# Patient Record
Sex: Female | Born: 1960
Health system: Southern US, Community
[De-identification: ages and names within clinical notes are randomized; demographics above are authoritative.]

## PROBLEM LIST (undated history)

## (undated) DIAGNOSIS — E785 Hyperlipidemia, unspecified: Secondary | ICD-10-CM

## (undated) DIAGNOSIS — M810 Age-related osteoporosis without current pathological fracture: Secondary | ICD-10-CM

## (undated) DIAGNOSIS — S069X9A Unspecified intracranial injury with loss of consciousness of unspecified duration, initial encounter: Secondary | ICD-10-CM

## (undated) DIAGNOSIS — S069XAA Unspecified intracranial injury with loss of consciousness status unknown, initial encounter: Secondary | ICD-10-CM

## (undated) DIAGNOSIS — I10 Essential (primary) hypertension: Secondary | ICD-10-CM

## (undated) DIAGNOSIS — E119 Type 2 diabetes mellitus without complications: Secondary | ICD-10-CM

## (undated) DIAGNOSIS — R479 Unspecified speech disturbances: Secondary | ICD-10-CM

## (undated) DIAGNOSIS — R4183 Borderline intellectual functioning: Secondary | ICD-10-CM

## (undated) DIAGNOSIS — H532 Diplopia: Secondary | ICD-10-CM

## (undated) DIAGNOSIS — G8250 Quadriplegia, unspecified: Secondary | ICD-10-CM

## (undated) DIAGNOSIS — F329 Major depressive disorder, single episode, unspecified: Secondary | ICD-10-CM

## (undated) DIAGNOSIS — K5909 Other constipation: Secondary | ICD-10-CM

## (undated) DIAGNOSIS — F32A Depression, unspecified: Secondary | ICD-10-CM

## (undated) DIAGNOSIS — M1990 Unspecified osteoarthritis, unspecified site: Secondary | ICD-10-CM

## (undated) HISTORY — PX: CHOLECYSTECTOMY: SHX55

---

## 2004-12-13 ENCOUNTER — Emergency Department: Payer: Self-pay | Admitting: General Practice

## 2004-12-15 ENCOUNTER — Emergency Department: Payer: Self-pay | Admitting: Internal Medicine

## 2006-04-01 ENCOUNTER — Ambulatory Visit: Payer: Self-pay

## 2006-04-05 ENCOUNTER — Ambulatory Visit: Payer: Self-pay

## 2006-08-17 ENCOUNTER — Ambulatory Visit: Payer: Self-pay | Admitting: Family Medicine

## 2006-11-11 ENCOUNTER — Ambulatory Visit: Payer: Self-pay | Admitting: Family Medicine

## 2007-08-25 ENCOUNTER — Encounter: Admission: RE | Admit: 2007-08-25 | Discharge: 2007-08-25 | Payer: Self-pay | Admitting: Neurology

## 2007-09-01 ENCOUNTER — Ambulatory Visit: Payer: Self-pay | Admitting: Family Medicine

## 2008-08-19 ENCOUNTER — Emergency Department: Payer: Self-pay | Admitting: Emergency Medicine

## 2008-10-17 ENCOUNTER — Ambulatory Visit: Payer: Self-pay | Admitting: Family Medicine

## 2008-11-01 ENCOUNTER — Ambulatory Visit: Payer: Self-pay | Admitting: Family Medicine

## 2009-06-18 ENCOUNTER — Ambulatory Visit: Payer: Self-pay | Admitting: Family Medicine

## 2009-10-27 ENCOUNTER — Emergency Department: Payer: Self-pay | Admitting: Emergency Medicine

## 2010-01-15 ENCOUNTER — Emergency Department: Payer: Self-pay | Admitting: Emergency Medicine

## 2010-08-26 ENCOUNTER — Ambulatory Visit: Payer: Self-pay | Admitting: Family Medicine

## 2010-09-15 ENCOUNTER — Emergency Department: Payer: Self-pay | Admitting: Internal Medicine

## 2010-10-11 ENCOUNTER — Emergency Department: Payer: Self-pay | Admitting: Emergency Medicine

## 2011-01-04 ENCOUNTER — Ambulatory Visit: Payer: Self-pay

## 2011-02-05 DIAGNOSIS — F3173 Bipolar disorder, in partial remission, most recent episode manic: Secondary | ICD-10-CM | POA: Diagnosis not present

## 2011-02-05 DIAGNOSIS — F079 Unspecified personality and behavioral disorder due to known physiological condition: Secondary | ICD-10-CM | POA: Diagnosis not present

## 2011-02-05 DIAGNOSIS — Z993 Dependence on wheelchair: Secondary | ICD-10-CM | POA: Diagnosis not present

## 2011-02-09 DIAGNOSIS — I10 Essential (primary) hypertension: Secondary | ICD-10-CM | POA: Diagnosis not present

## 2011-02-09 DIAGNOSIS — E119 Type 2 diabetes mellitus without complications: Secondary | ICD-10-CM | POA: Diagnosis not present

## 2011-02-09 DIAGNOSIS — E7800 Pure hypercholesterolemia, unspecified: Secondary | ICD-10-CM | POA: Diagnosis not present

## 2011-02-26 DIAGNOSIS — H16329 Diffuse interstitial keratitis, unspecified eye: Secondary | ICD-10-CM | POA: Diagnosis not present

## 2011-03-02 DIAGNOSIS — H16329 Diffuse interstitial keratitis, unspecified eye: Secondary | ICD-10-CM | POA: Diagnosis not present

## 2011-03-06 DIAGNOSIS — B351 Tinea unguium: Secondary | ICD-10-CM | POA: Diagnosis not present

## 2011-03-06 DIAGNOSIS — M79609 Pain in unspecified limb: Secondary | ICD-10-CM | POA: Diagnosis not present

## 2011-03-17 ENCOUNTER — Ambulatory Visit: Payer: Self-pay | Admitting: Internal Medicine

## 2011-03-17 DIAGNOSIS — M79609 Pain in unspecified limb: Secondary | ICD-10-CM | POA: Diagnosis not present

## 2011-03-30 DIAGNOSIS — H16329 Diffuse interstitial keratitis, unspecified eye: Secondary | ICD-10-CM | POA: Diagnosis not present

## 2011-04-14 DIAGNOSIS — E7800 Pure hypercholesterolemia, unspecified: Secondary | ICD-10-CM | POA: Diagnosis not present

## 2011-04-14 DIAGNOSIS — I10 Essential (primary) hypertension: Secondary | ICD-10-CM | POA: Diagnosis not present

## 2011-04-15 DIAGNOSIS — F39 Unspecified mood [affective] disorder: Secondary | ICD-10-CM | POA: Diagnosis not present

## 2011-04-20 DIAGNOSIS — H16329 Diffuse interstitial keratitis, unspecified eye: Secondary | ICD-10-CM | POA: Diagnosis not present

## 2011-05-21 DIAGNOSIS — N3944 Nocturnal enuresis: Secondary | ICD-10-CM | POA: Diagnosis not present

## 2011-06-11 DIAGNOSIS — F39 Unspecified mood [affective] disorder: Secondary | ICD-10-CM | POA: Diagnosis not present

## 2011-06-23 DIAGNOSIS — M79609 Pain in unspecified limb: Secondary | ICD-10-CM | POA: Diagnosis not present

## 2011-06-23 DIAGNOSIS — B351 Tinea unguium: Secondary | ICD-10-CM | POA: Diagnosis not present

## 2011-07-17 DIAGNOSIS — Z23 Encounter for immunization: Secondary | ICD-10-CM | POA: Diagnosis not present

## 2011-07-20 DIAGNOSIS — F70 Mild intellectual disabilities: Secondary | ICD-10-CM | POA: Diagnosis not present

## 2011-07-22 DIAGNOSIS — F70 Mild intellectual disabilities: Secondary | ICD-10-CM | POA: Diagnosis not present

## 2011-07-28 DIAGNOSIS — N390 Urinary tract infection, site not specified: Secondary | ICD-10-CM | POA: Diagnosis not present

## 2011-09-21 DIAGNOSIS — G3189 Other specified degenerative diseases of nervous system: Secondary | ICD-10-CM | POA: Diagnosis not present

## 2011-09-21 DIAGNOSIS — F17200 Nicotine dependence, unspecified, uncomplicated: Secondary | ICD-10-CM | POA: Diagnosis not present

## 2011-09-21 DIAGNOSIS — I739 Peripheral vascular disease, unspecified: Secondary | ICD-10-CM | POA: Diagnosis not present

## 2011-09-23 DIAGNOSIS — B351 Tinea unguium: Secondary | ICD-10-CM | POA: Diagnosis not present

## 2011-09-23 DIAGNOSIS — M79609 Pain in unspecified limb: Secondary | ICD-10-CM | POA: Diagnosis not present

## 2011-10-20 ENCOUNTER — Ambulatory Visit: Payer: Self-pay | Admitting: Family Medicine

## 2011-10-20 DIAGNOSIS — Z1231 Encounter for screening mammogram for malignant neoplasm of breast: Secondary | ICD-10-CM | POA: Diagnosis not present

## 2011-11-23 DIAGNOSIS — F17200 Nicotine dependence, unspecified, uncomplicated: Secondary | ICD-10-CM | POA: Diagnosis not present

## 2011-11-23 DIAGNOSIS — E7800 Pure hypercholesterolemia, unspecified: Secondary | ICD-10-CM | POA: Diagnosis not present

## 2011-11-23 DIAGNOSIS — Z23 Encounter for immunization: Secondary | ICD-10-CM | POA: Diagnosis not present

## 2011-12-01 DIAGNOSIS — M545 Low back pain: Secondary | ICD-10-CM | POA: Diagnosis not present

## 2011-12-01 DIAGNOSIS — K5900 Constipation, unspecified: Secondary | ICD-10-CM | POA: Diagnosis not present

## 2011-12-07 DIAGNOSIS — F39 Unspecified mood [affective] disorder: Secondary | ICD-10-CM | POA: Diagnosis not present

## 2012-01-06 DIAGNOSIS — M79609 Pain in unspecified limb: Secondary | ICD-10-CM | POA: Diagnosis not present

## 2012-01-06 DIAGNOSIS — S91109A Unspecified open wound of unspecified toe(s) without damage to nail, initial encounter: Secondary | ICD-10-CM | POA: Diagnosis not present

## 2012-01-06 DIAGNOSIS — B351 Tinea unguium: Secondary | ICD-10-CM | POA: Diagnosis not present

## 2012-02-10 DIAGNOSIS — Z1159 Encounter for screening for other viral diseases: Secondary | ICD-10-CM | POA: Diagnosis not present

## 2012-02-10 DIAGNOSIS — Z0000 Encounter for general adult medical examination without abnormal findings: Secondary | ICD-10-CM | POA: Diagnosis not present

## 2012-02-22 DIAGNOSIS — F339 Major depressive disorder, recurrent, unspecified: Secondary | ICD-10-CM | POA: Diagnosis not present

## 2012-02-22 DIAGNOSIS — F39 Unspecified mood [affective] disorder: Secondary | ICD-10-CM | POA: Diagnosis not present

## 2012-03-03 DIAGNOSIS — Z1159 Encounter for screening for other viral diseases: Secondary | ICD-10-CM | POA: Diagnosis not present

## 2012-03-08 DIAGNOSIS — E11329 Type 2 diabetes mellitus with mild nonproliferative diabetic retinopathy without macular edema: Secondary | ICD-10-CM | POA: Diagnosis not present

## 2012-04-06 DIAGNOSIS — M79609 Pain in unspecified limb: Secondary | ICD-10-CM | POA: Diagnosis not present

## 2012-04-06 DIAGNOSIS — B351 Tinea unguium: Secondary | ICD-10-CM | POA: Diagnosis not present

## 2012-05-17 DIAGNOSIS — F339 Major depressive disorder, recurrent, unspecified: Secondary | ICD-10-CM | POA: Diagnosis not present

## 2012-05-17 DIAGNOSIS — F39 Unspecified mood [affective] disorder: Secondary | ICD-10-CM | POA: Diagnosis not present

## 2012-06-17 DIAGNOSIS — I10 Essential (primary) hypertension: Secondary | ICD-10-CM | POA: Diagnosis not present

## 2012-06-17 DIAGNOSIS — I739 Peripheral vascular disease, unspecified: Secondary | ICD-10-CM | POA: Diagnosis not present

## 2012-06-17 DIAGNOSIS — E119 Type 2 diabetes mellitus without complications: Secondary | ICD-10-CM | POA: Diagnosis not present

## 2012-06-17 DIAGNOSIS — E7800 Pure hypercholesterolemia, unspecified: Secondary | ICD-10-CM | POA: Diagnosis not present

## 2012-06-17 DIAGNOSIS — R350 Frequency of micturition: Secondary | ICD-10-CM | POA: Diagnosis not present

## 2012-06-21 DIAGNOSIS — F329 Major depressive disorder, single episode, unspecified: Secondary | ICD-10-CM | POA: Diagnosis not present

## 2012-06-22 DIAGNOSIS — I10 Essential (primary) hypertension: Secondary | ICD-10-CM | POA: Diagnosis not present

## 2012-06-22 DIAGNOSIS — R05 Cough: Secondary | ICD-10-CM | POA: Diagnosis not present

## 2012-07-06 DIAGNOSIS — M79609 Pain in unspecified limb: Secondary | ICD-10-CM | POA: Diagnosis not present

## 2012-07-06 DIAGNOSIS — B351 Tinea unguium: Secondary | ICD-10-CM | POA: Diagnosis not present

## 2012-07-07 DIAGNOSIS — I739 Peripheral vascular disease, unspecified: Secondary | ICD-10-CM | POA: Diagnosis not present

## 2012-07-07 DIAGNOSIS — I872 Venous insufficiency (chronic) (peripheral): Secondary | ICD-10-CM | POA: Diagnosis not present

## 2012-07-07 DIAGNOSIS — E119 Type 2 diabetes mellitus without complications: Secondary | ICD-10-CM | POA: Diagnosis not present

## 2012-07-07 DIAGNOSIS — M7989 Other specified soft tissue disorders: Secondary | ICD-10-CM | POA: Diagnosis not present

## 2012-07-14 DIAGNOSIS — F329 Major depressive disorder, single episode, unspecified: Secondary | ICD-10-CM | POA: Diagnosis not present

## 2012-07-20 DIAGNOSIS — I69993 Ataxia following unspecified cerebrovascular disease: Secondary | ICD-10-CM | POA: Diagnosis not present

## 2012-07-27 DIAGNOSIS — I69993 Ataxia following unspecified cerebrovascular disease: Secondary | ICD-10-CM | POA: Diagnosis not present

## 2012-07-27 DIAGNOSIS — I69959 Hemiplegia and hemiparesis following unspecified cerebrovascular disease affecting unspecified side: Secondary | ICD-10-CM | POA: Diagnosis not present

## 2012-07-27 DIAGNOSIS — R269 Unspecified abnormalities of gait and mobility: Secondary | ICD-10-CM | POA: Diagnosis not present

## 2012-07-27 DIAGNOSIS — M6281 Muscle weakness (generalized): Secondary | ICD-10-CM | POA: Diagnosis not present

## 2012-08-01 DIAGNOSIS — I69993 Ataxia following unspecified cerebrovascular disease: Secondary | ICD-10-CM | POA: Diagnosis not present

## 2012-08-01 DIAGNOSIS — M6281 Muscle weakness (generalized): Secondary | ICD-10-CM | POA: Diagnosis not present

## 2012-08-01 DIAGNOSIS — R269 Unspecified abnormalities of gait and mobility: Secondary | ICD-10-CM | POA: Diagnosis not present

## 2012-08-01 DIAGNOSIS — I69959 Hemiplegia and hemiparesis following unspecified cerebrovascular disease affecting unspecified side: Secondary | ICD-10-CM | POA: Diagnosis not present

## 2012-08-03 DIAGNOSIS — M6281 Muscle weakness (generalized): Secondary | ICD-10-CM | POA: Diagnosis not present

## 2012-08-03 DIAGNOSIS — R269 Unspecified abnormalities of gait and mobility: Secondary | ICD-10-CM | POA: Diagnosis not present

## 2012-08-03 DIAGNOSIS — I69959 Hemiplegia and hemiparesis following unspecified cerebrovascular disease affecting unspecified side: Secondary | ICD-10-CM | POA: Diagnosis not present

## 2012-08-03 DIAGNOSIS — I69993 Ataxia following unspecified cerebrovascular disease: Secondary | ICD-10-CM | POA: Diagnosis not present

## 2012-08-04 ENCOUNTER — Ambulatory Visit: Payer: Self-pay | Admitting: Neurology

## 2012-08-04 DIAGNOSIS — E785 Hyperlipidemia, unspecified: Secondary | ICD-10-CM | POA: Diagnosis not present

## 2012-08-04 DIAGNOSIS — F29 Unspecified psychosis not due to a substance or known physiological condition: Secondary | ICD-10-CM | POA: Diagnosis not present

## 2012-08-04 DIAGNOSIS — E669 Obesity, unspecified: Secondary | ICD-10-CM | POA: Diagnosis not present

## 2012-08-04 DIAGNOSIS — M79609 Pain in unspecified limb: Secondary | ICD-10-CM | POA: Diagnosis not present

## 2012-08-04 DIAGNOSIS — IMO0002 Reserved for concepts with insufficient information to code with codable children: Secondary | ICD-10-CM | POA: Diagnosis not present

## 2012-08-05 DIAGNOSIS — R4182 Altered mental status, unspecified: Secondary | ICD-10-CM | POA: Diagnosis not present

## 2012-08-08 DIAGNOSIS — I69959 Hemiplegia and hemiparesis following unspecified cerebrovascular disease affecting unspecified side: Secondary | ICD-10-CM | POA: Diagnosis not present

## 2012-08-08 DIAGNOSIS — R269 Unspecified abnormalities of gait and mobility: Secondary | ICD-10-CM | POA: Diagnosis not present

## 2012-08-08 DIAGNOSIS — I69993 Ataxia following unspecified cerebrovascular disease: Secondary | ICD-10-CM | POA: Diagnosis not present

## 2012-08-08 DIAGNOSIS — M6281 Muscle weakness (generalized): Secondary | ICD-10-CM | POA: Diagnosis not present

## 2012-08-11 DIAGNOSIS — F329 Major depressive disorder, single episode, unspecified: Secondary | ICD-10-CM | POA: Diagnosis not present

## 2012-08-15 DIAGNOSIS — R269 Unspecified abnormalities of gait and mobility: Secondary | ICD-10-CM | POA: Diagnosis not present

## 2012-08-15 DIAGNOSIS — I69993 Ataxia following unspecified cerebrovascular disease: Secondary | ICD-10-CM | POA: Diagnosis not present

## 2012-08-15 DIAGNOSIS — I69959 Hemiplegia and hemiparesis following unspecified cerebrovascular disease affecting unspecified side: Secondary | ICD-10-CM | POA: Diagnosis not present

## 2012-08-15 DIAGNOSIS — M6281 Muscle weakness (generalized): Secondary | ICD-10-CM | POA: Diagnosis not present

## 2012-08-17 DIAGNOSIS — I69993 Ataxia following unspecified cerebrovascular disease: Secondary | ICD-10-CM | POA: Diagnosis not present

## 2012-08-17 DIAGNOSIS — I69959 Hemiplegia and hemiparesis following unspecified cerebrovascular disease affecting unspecified side: Secondary | ICD-10-CM | POA: Diagnosis not present

## 2012-08-17 DIAGNOSIS — M6281 Muscle weakness (generalized): Secondary | ICD-10-CM | POA: Diagnosis not present

## 2012-08-17 DIAGNOSIS — R269 Unspecified abnormalities of gait and mobility: Secondary | ICD-10-CM | POA: Diagnosis not present

## 2012-08-23 DIAGNOSIS — F329 Major depressive disorder, single episode, unspecified: Secondary | ICD-10-CM | POA: Diagnosis not present

## 2012-08-24 DIAGNOSIS — M6281 Muscle weakness (generalized): Secondary | ICD-10-CM | POA: Diagnosis not present

## 2012-08-24 DIAGNOSIS — I69993 Ataxia following unspecified cerebrovascular disease: Secondary | ICD-10-CM | POA: Diagnosis not present

## 2012-08-24 DIAGNOSIS — R269 Unspecified abnormalities of gait and mobility: Secondary | ICD-10-CM | POA: Diagnosis not present

## 2012-08-24 DIAGNOSIS — I69959 Hemiplegia and hemiparesis following unspecified cerebrovascular disease affecting unspecified side: Secondary | ICD-10-CM | POA: Diagnosis not present

## 2012-08-26 DIAGNOSIS — I69959 Hemiplegia and hemiparesis following unspecified cerebrovascular disease affecting unspecified side: Secondary | ICD-10-CM | POA: Diagnosis not present

## 2012-08-26 DIAGNOSIS — I69993 Ataxia following unspecified cerebrovascular disease: Secondary | ICD-10-CM | POA: Diagnosis not present

## 2012-08-26 DIAGNOSIS — M6281 Muscle weakness (generalized): Secondary | ICD-10-CM | POA: Diagnosis not present

## 2012-08-26 DIAGNOSIS — R269 Unspecified abnormalities of gait and mobility: Secondary | ICD-10-CM | POA: Diagnosis not present

## 2012-08-29 DIAGNOSIS — I69959 Hemiplegia and hemiparesis following unspecified cerebrovascular disease affecting unspecified side: Secondary | ICD-10-CM | POA: Diagnosis not present

## 2012-08-29 DIAGNOSIS — M6281 Muscle weakness (generalized): Secondary | ICD-10-CM | POA: Diagnosis not present

## 2012-08-29 DIAGNOSIS — I69993 Ataxia following unspecified cerebrovascular disease: Secondary | ICD-10-CM | POA: Diagnosis not present

## 2012-08-29 DIAGNOSIS — R269 Unspecified abnormalities of gait and mobility: Secondary | ICD-10-CM | POA: Diagnosis not present

## 2012-08-30 DIAGNOSIS — F329 Major depressive disorder, single episode, unspecified: Secondary | ICD-10-CM | POA: Diagnosis not present

## 2012-08-31 DIAGNOSIS — I69993 Ataxia following unspecified cerebrovascular disease: Secondary | ICD-10-CM | POA: Diagnosis not present

## 2012-08-31 DIAGNOSIS — R269 Unspecified abnormalities of gait and mobility: Secondary | ICD-10-CM | POA: Diagnosis not present

## 2012-08-31 DIAGNOSIS — I69959 Hemiplegia and hemiparesis following unspecified cerebrovascular disease affecting unspecified side: Secondary | ICD-10-CM | POA: Diagnosis not present

## 2012-08-31 DIAGNOSIS — M6281 Muscle weakness (generalized): Secondary | ICD-10-CM | POA: Diagnosis not present

## 2012-09-02 DIAGNOSIS — R9409 Abnormal results of other function studies of central nervous system: Secondary | ICD-10-CM | POA: Diagnosis not present

## 2012-09-02 DIAGNOSIS — R05 Cough: Secondary | ICD-10-CM | POA: Diagnosis not present

## 2012-09-02 DIAGNOSIS — R059 Cough, unspecified: Secondary | ICD-10-CM | POA: Diagnosis not present

## 2012-09-05 DIAGNOSIS — I69993 Ataxia following unspecified cerebrovascular disease: Secondary | ICD-10-CM | POA: Diagnosis not present

## 2012-09-05 DIAGNOSIS — M6281 Muscle weakness (generalized): Secondary | ICD-10-CM | POA: Diagnosis not present

## 2012-09-05 DIAGNOSIS — R269 Unspecified abnormalities of gait and mobility: Secondary | ICD-10-CM | POA: Diagnosis not present

## 2012-09-05 DIAGNOSIS — I69959 Hemiplegia and hemiparesis following unspecified cerebrovascular disease affecting unspecified side: Secondary | ICD-10-CM | POA: Diagnosis not present

## 2012-09-07 DIAGNOSIS — R269 Unspecified abnormalities of gait and mobility: Secondary | ICD-10-CM | POA: Diagnosis not present

## 2012-09-07 DIAGNOSIS — I69959 Hemiplegia and hemiparesis following unspecified cerebrovascular disease affecting unspecified side: Secondary | ICD-10-CM | POA: Diagnosis not present

## 2012-09-07 DIAGNOSIS — I69993 Ataxia following unspecified cerebrovascular disease: Secondary | ICD-10-CM | POA: Diagnosis not present

## 2012-09-07 DIAGNOSIS — M6281 Muscle weakness (generalized): Secondary | ICD-10-CM | POA: Diagnosis not present

## 2012-09-13 DIAGNOSIS — R269 Unspecified abnormalities of gait and mobility: Secondary | ICD-10-CM | POA: Diagnosis not present

## 2012-09-13 DIAGNOSIS — M6281 Muscle weakness (generalized): Secondary | ICD-10-CM | POA: Diagnosis not present

## 2012-09-13 DIAGNOSIS — I69993 Ataxia following unspecified cerebrovascular disease: Secondary | ICD-10-CM | POA: Diagnosis not present

## 2012-09-13 DIAGNOSIS — I69959 Hemiplegia and hemiparesis following unspecified cerebrovascular disease affecting unspecified side: Secondary | ICD-10-CM | POA: Diagnosis not present

## 2012-09-15 DIAGNOSIS — R269 Unspecified abnormalities of gait and mobility: Secondary | ICD-10-CM | POA: Diagnosis not present

## 2012-09-15 DIAGNOSIS — M6281 Muscle weakness (generalized): Secondary | ICD-10-CM | POA: Diagnosis not present

## 2012-09-15 DIAGNOSIS — I69993 Ataxia following unspecified cerebrovascular disease: Secondary | ICD-10-CM | POA: Diagnosis not present

## 2012-09-15 DIAGNOSIS — I69959 Hemiplegia and hemiparesis following unspecified cerebrovascular disease affecting unspecified side: Secondary | ICD-10-CM | POA: Diagnosis not present

## 2012-09-20 DIAGNOSIS — R300 Dysuria: Secondary | ICD-10-CM | POA: Diagnosis not present

## 2012-09-20 DIAGNOSIS — I69993 Ataxia following unspecified cerebrovascular disease: Secondary | ICD-10-CM | POA: Diagnosis not present

## 2012-09-20 DIAGNOSIS — R269 Unspecified abnormalities of gait and mobility: Secondary | ICD-10-CM | POA: Diagnosis not present

## 2012-09-20 DIAGNOSIS — N390 Urinary tract infection, site not specified: Secondary | ICD-10-CM | POA: Diagnosis not present

## 2012-09-20 DIAGNOSIS — M6281 Muscle weakness (generalized): Secondary | ICD-10-CM | POA: Diagnosis not present

## 2012-09-20 DIAGNOSIS — I69959 Hemiplegia and hemiparesis following unspecified cerebrovascular disease affecting unspecified side: Secondary | ICD-10-CM | POA: Diagnosis not present

## 2012-09-23 DIAGNOSIS — M6281 Muscle weakness (generalized): Secondary | ICD-10-CM | POA: Diagnosis not present

## 2012-09-23 DIAGNOSIS — I69959 Hemiplegia and hemiparesis following unspecified cerebrovascular disease affecting unspecified side: Secondary | ICD-10-CM | POA: Diagnosis not present

## 2012-09-23 DIAGNOSIS — R269 Unspecified abnormalities of gait and mobility: Secondary | ICD-10-CM | POA: Diagnosis not present

## 2012-09-23 DIAGNOSIS — I69993 Ataxia following unspecified cerebrovascular disease: Secondary | ICD-10-CM | POA: Diagnosis not present

## 2012-09-23 DIAGNOSIS — R509 Fever, unspecified: Secondary | ICD-10-CM | POA: Diagnosis not present

## 2012-09-23 DIAGNOSIS — R5381 Other malaise: Secondary | ICD-10-CM | POA: Diagnosis not present

## 2012-09-23 LAB — COMPREHENSIVE METABOLIC PANEL
BUN: 39 mg/dL — ABNORMAL HIGH (ref 7–18)
Calcium, Total: 9.5 mg/dL (ref 8.5–10.1)
Chloride: 109 mmol/L — ABNORMAL HIGH (ref 98–107)
Co2: 28 mmol/L (ref 21–32)
EGFR (Non-African Amer.): 34 — ABNORMAL LOW
Glucose: 314 mg/dL — ABNORMAL HIGH (ref 65–99)
Osmolality: 304 (ref 275–301)
Potassium: 4.2 mmol/L (ref 3.5–5.1)
SGOT(AST): 30 U/L (ref 15–37)
SGPT (ALT): 29 U/L (ref 12–78)

## 2012-09-23 LAB — URINALYSIS, COMPLETE
Bilirubin,UR: NEGATIVE
Glucose,UR: >=500 mg/dL (ref 0–75)
Ketone: NEGATIVE
Nitrite: NEGATIVE
Ph: 5 (ref 4.5–8.0)
Protein: 100
RBC,UR: 48 /HPF (ref 0–5)
Specific Gravity: 1.021 (ref 1.003–1.030)
Squamous Epithelial: 2
WBC UR: 48 /HPF (ref 0–5)

## 2012-09-23 LAB — CBC
HCT: 37.2 % (ref 35.0–47.0)
HGB: 12.4 g/dL (ref 12.0–16.0)
Platelet: 174 10*3/uL (ref 150–440)
RBC: 4.01 10*6/uL (ref 3.80–5.20)
WBC: 20.3 10*3/uL — ABNORMAL HIGH (ref 3.6–11.0)

## 2012-09-24 ENCOUNTER — Inpatient Hospital Stay: Payer: Self-pay | Admitting: Internal Medicine

## 2012-09-24 ENCOUNTER — Ambulatory Visit: Payer: Self-pay | Admitting: Urology

## 2012-09-24 DIAGNOSIS — R6510 Systemic inflammatory response syndrome (SIRS) of non-infectious origin without acute organ dysfunction: Secondary | ICD-10-CM | POA: Diagnosis not present

## 2012-09-24 DIAGNOSIS — N201 Calculus of ureter: Secondary | ICD-10-CM | POA: Diagnosis not present

## 2012-09-24 DIAGNOSIS — E119 Type 2 diabetes mellitus without complications: Secondary | ICD-10-CM | POA: Diagnosis not present

## 2012-09-24 DIAGNOSIS — R300 Dysuria: Secondary | ICD-10-CM | POA: Diagnosis not present

## 2012-09-24 DIAGNOSIS — Z87820 Personal history of traumatic brain injury: Secondary | ICD-10-CM | POA: Diagnosis not present

## 2012-09-24 DIAGNOSIS — N1330 Unspecified hydronephrosis: Secondary | ICD-10-CM | POA: Diagnosis not present

## 2012-09-24 DIAGNOSIS — A4151 Sepsis due to Escherichia coli [E. coli]: Secondary | ICD-10-CM | POA: Diagnosis not present

## 2012-09-24 DIAGNOSIS — A419 Sepsis, unspecified organism: Secondary | ICD-10-CM | POA: Diagnosis not present

## 2012-09-24 DIAGNOSIS — Z882 Allergy status to sulfonamides status: Secondary | ICD-10-CM | POA: Diagnosis not present

## 2012-09-24 DIAGNOSIS — N179 Acute kidney failure, unspecified: Secondary | ICD-10-CM | POA: Diagnosis present

## 2012-09-24 DIAGNOSIS — I10 Essential (primary) hypertension: Secondary | ICD-10-CM | POA: Diagnosis not present

## 2012-09-24 DIAGNOSIS — M810 Age-related osteoporosis without current pathological fracture: Secondary | ICD-10-CM | POA: Diagnosis present

## 2012-09-24 DIAGNOSIS — R350 Frequency of micturition: Secondary | ICD-10-CM | POA: Diagnosis not present

## 2012-09-24 DIAGNOSIS — N390 Urinary tract infection, site not specified: Secondary | ICD-10-CM | POA: Diagnosis not present

## 2012-09-24 DIAGNOSIS — Z87440 Personal history of urinary (tract) infections: Secondary | ICD-10-CM | POA: Diagnosis not present

## 2012-09-24 DIAGNOSIS — Z888 Allergy status to other drugs, medicaments and biological substances status: Secondary | ICD-10-CM | POA: Diagnosis not present

## 2012-09-24 DIAGNOSIS — Z880 Allergy status to penicillin: Secondary | ICD-10-CM | POA: Diagnosis not present

## 2012-09-24 DIAGNOSIS — N200 Calculus of kidney: Secondary | ICD-10-CM | POA: Diagnosis present

## 2012-09-24 DIAGNOSIS — E785 Hyperlipidemia, unspecified: Secondary | ICD-10-CM | POA: Diagnosis present

## 2012-09-25 LAB — BASIC METABOLIC PANEL
Anion Gap: 7 (ref 7–16)
BUN: 15 mg/dL (ref 7–18)
Calcium, Total: 8.2 mg/dL — ABNORMAL LOW (ref 8.5–10.1)
Co2: 25 mmol/L (ref 21–32)
Creatinine: 0.99 mg/dL (ref 0.60–1.30)
EGFR (African American): >60
Osmolality: 285 (ref 275–301)
Sodium: 142 mmol/L (ref 136–145)

## 2012-09-25 LAB — MAGNESIUM: Magnesium: 1.8 mg/dL

## 2012-09-25 LAB — CBC WITH DIFFERENTIAL/PLATELET
Basophil %: 0.3 %
Eosinophil %: 1.6 %
HGB: 10.6 g/dL — ABNORMAL LOW (ref 12.0–16.0)
Lymphocyte %: 21.6 %
MCV: 91 fL (ref 80–100)
Monocyte %: 10.3 %
Neutrophil #: 5.3 10*3/uL (ref 1.4–6.5)
RBC: 3.42 10*6/uL — ABNORMAL LOW (ref 3.80–5.20)
RDW: 13.4 % (ref 11.5–14.5)

## 2012-09-25 LAB — URINE CULTURE

## 2012-09-28 LAB — CULTURE, BLOOD (SINGLE)

## 2012-09-29 DIAGNOSIS — R269 Unspecified abnormalities of gait and mobility: Secondary | ICD-10-CM | POA: Diagnosis not present

## 2012-09-29 DIAGNOSIS — I69993 Ataxia following unspecified cerebrovascular disease: Secondary | ICD-10-CM | POA: Diagnosis not present

## 2012-09-29 DIAGNOSIS — I69959 Hemiplegia and hemiparesis following unspecified cerebrovascular disease affecting unspecified side: Secondary | ICD-10-CM | POA: Diagnosis not present

## 2012-09-29 DIAGNOSIS — M6281 Muscle weakness (generalized): Secondary | ICD-10-CM | POA: Diagnosis not present

## 2012-10-02 DIAGNOSIS — M6281 Muscle weakness (generalized): Secondary | ICD-10-CM | POA: Diagnosis not present

## 2012-10-02 DIAGNOSIS — R269 Unspecified abnormalities of gait and mobility: Secondary | ICD-10-CM | POA: Diagnosis not present

## 2012-10-02 DIAGNOSIS — I69959 Hemiplegia and hemiparesis following unspecified cerebrovascular disease affecting unspecified side: Secondary | ICD-10-CM | POA: Diagnosis not present

## 2012-10-02 DIAGNOSIS — I69993 Ataxia following unspecified cerebrovascular disease: Secondary | ICD-10-CM | POA: Diagnosis not present

## 2012-10-04 DIAGNOSIS — M6281 Muscle weakness (generalized): Secondary | ICD-10-CM | POA: Diagnosis not present

## 2012-10-04 DIAGNOSIS — I69959 Hemiplegia and hemiparesis following unspecified cerebrovascular disease affecting unspecified side: Secondary | ICD-10-CM | POA: Diagnosis not present

## 2012-10-04 DIAGNOSIS — I69993 Ataxia following unspecified cerebrovascular disease: Secondary | ICD-10-CM | POA: Diagnosis not present

## 2012-10-04 DIAGNOSIS — R269 Unspecified abnormalities of gait and mobility: Secondary | ICD-10-CM | POA: Diagnosis not present

## 2012-10-06 ENCOUNTER — Inpatient Hospital Stay: Payer: Self-pay | Admitting: Student

## 2012-10-06 DIAGNOSIS — R4182 Altered mental status, unspecified: Secondary | ICD-10-CM | POA: Diagnosis not present

## 2012-10-06 DIAGNOSIS — R570 Cardiogenic shock: Secondary | ICD-10-CM | POA: Diagnosis present

## 2012-10-06 DIAGNOSIS — Z800 Family history of malignant neoplasm of digestive organs: Secondary | ICD-10-CM | POA: Diagnosis not present

## 2012-10-06 DIAGNOSIS — Z85038 Personal history of other malignant neoplasm of large intestine: Secondary | ICD-10-CM | POA: Diagnosis not present

## 2012-10-06 DIAGNOSIS — N170 Acute kidney failure with tubular necrosis: Secondary | ICD-10-CM | POA: Diagnosis not present

## 2012-10-06 DIAGNOSIS — K921 Melena: Secondary | ICD-10-CM | POA: Diagnosis present

## 2012-10-06 DIAGNOSIS — R61 Generalized hyperhidrosis: Secondary | ICD-10-CM | POA: Diagnosis not present

## 2012-10-06 DIAGNOSIS — R195 Other fecal abnormalities: Secondary | ICD-10-CM | POA: Diagnosis not present

## 2012-10-06 DIAGNOSIS — D509 Iron deficiency anemia, unspecified: Secondary | ICD-10-CM | POA: Diagnosis not present

## 2012-10-06 DIAGNOSIS — Z87820 Personal history of traumatic brain injury: Secondary | ICD-10-CM | POA: Diagnosis not present

## 2012-10-06 DIAGNOSIS — K2100 Gastro-esophageal reflux disease with esophagitis, without bleeding: Secondary | ICD-10-CM | POA: Diagnosis not present

## 2012-10-06 DIAGNOSIS — K2270 Barrett's esophagus without dysplasia: Secondary | ICD-10-CM | POA: Diagnosis present

## 2012-10-06 DIAGNOSIS — R1013 Epigastric pain: Secondary | ICD-10-CM | POA: Diagnosis not present

## 2012-10-06 DIAGNOSIS — R1011 Right upper quadrant pain: Secondary | ICD-10-CM | POA: Diagnosis not present

## 2012-10-06 DIAGNOSIS — N390 Urinary tract infection, site not specified: Secondary | ICD-10-CM | POA: Diagnosis not present

## 2012-10-06 DIAGNOSIS — D649 Anemia, unspecified: Secondary | ICD-10-CM | POA: Diagnosis not present

## 2012-10-06 DIAGNOSIS — R0902 Hypoxemia: Secondary | ICD-10-CM | POA: Diagnosis not present

## 2012-10-06 DIAGNOSIS — K449 Diaphragmatic hernia without obstruction or gangrene: Secondary | ICD-10-CM | POA: Diagnosis not present

## 2012-10-06 DIAGNOSIS — N179 Acute kidney failure, unspecified: Secondary | ICD-10-CM | POA: Diagnosis not present

## 2012-10-06 DIAGNOSIS — G9340 Encephalopathy, unspecified: Secondary | ICD-10-CM | POA: Diagnosis not present

## 2012-10-06 DIAGNOSIS — Z4682 Encounter for fitting and adjustment of non-vascular catheter: Secondary | ICD-10-CM | POA: Diagnosis not present

## 2012-10-06 DIAGNOSIS — D72829 Elevated white blood cell count, unspecified: Secondary | ICD-10-CM | POA: Diagnosis not present

## 2012-10-06 DIAGNOSIS — E232 Diabetes insipidus: Secondary | ICD-10-CM | POA: Diagnosis present

## 2012-10-06 DIAGNOSIS — M810 Age-related osteoporosis without current pathological fracture: Secondary | ICD-10-CM | POA: Diagnosis present

## 2012-10-06 DIAGNOSIS — Z859 Personal history of malignant neoplasm, unspecified: Secondary | ICD-10-CM | POA: Diagnosis not present

## 2012-10-06 DIAGNOSIS — J9600 Acute respiratory failure, unspecified whether with hypoxia or hypercapnia: Secondary | ICD-10-CM | POA: Diagnosis not present

## 2012-10-06 DIAGNOSIS — R578 Other shock: Secondary | ICD-10-CM | POA: Diagnosis not present

## 2012-10-06 DIAGNOSIS — R55 Syncope and collapse: Secondary | ICD-10-CM | POA: Diagnosis not present

## 2012-10-06 DIAGNOSIS — K5289 Other specified noninfective gastroenteritis and colitis: Secondary | ICD-10-CM | POA: Diagnosis present

## 2012-10-06 DIAGNOSIS — R34 Anuria and oliguria: Secondary | ICD-10-CM | POA: Diagnosis present

## 2012-10-06 DIAGNOSIS — K922 Gastrointestinal hemorrhage, unspecified: Secondary | ICD-10-CM | POA: Diagnosis present

## 2012-10-06 DIAGNOSIS — A419 Sepsis, unspecified organism: Secondary | ICD-10-CM | POA: Diagnosis not present

## 2012-10-06 DIAGNOSIS — R0602 Shortness of breath: Secondary | ICD-10-CM | POA: Diagnosis not present

## 2012-10-06 DIAGNOSIS — R1310 Dysphagia, unspecified: Secondary | ICD-10-CM | POA: Diagnosis present

## 2012-10-06 DIAGNOSIS — R7309 Other abnormal glucose: Secondary | ICD-10-CM | POA: Diagnosis not present

## 2012-10-06 DIAGNOSIS — I959 Hypotension, unspecified: Secondary | ICD-10-CM | POA: Diagnosis not present

## 2012-10-06 DIAGNOSIS — Z0389 Encounter for observation for other suspected diseases and conditions ruled out: Secondary | ICD-10-CM | POA: Diagnosis not present

## 2012-10-06 DIAGNOSIS — E1165 Type 2 diabetes mellitus with hyperglycemia: Secondary | ICD-10-CM | POA: Diagnosis present

## 2012-10-06 DIAGNOSIS — J690 Pneumonitis due to inhalation of food and vomit: Secondary | ICD-10-CM | POA: Diagnosis not present

## 2012-10-06 DIAGNOSIS — K5900 Constipation, unspecified: Secondary | ICD-10-CM | POA: Diagnosis present

## 2012-10-06 LAB — URINALYSIS, COMPLETE
Glucose,UR: >=500 mg/dL (ref 0–75)
Hyaline Cast: 31
Ketone: NEGATIVE
Nitrite: NEGATIVE
RBC,UR: 3 /HPF (ref 0–5)
Specific Gravity: 1.018 (ref 1.003–1.030)
WBC UR: 9 /HPF (ref 0–5)

## 2012-10-06 LAB — COMPREHENSIVE METABOLIC PANEL
Alkaline Phosphatase: 90 U/L (ref 50–136)
Bilirubin,Total: 0.2 mg/dL (ref 0.2–1.0)
Calcium, Total: 9.4 mg/dL (ref 8.5–10.1)
Chloride: 104 mmol/L (ref 98–107)
EGFR (Non-African Amer.): 24 — ABNORMAL LOW
Glucose: 604 mg/dL (ref 65–99)
SGOT(AST): 12 U/L — ABNORMAL LOW (ref 15–37)
Sodium: 135 mmol/L — ABNORMAL LOW (ref 136–145)

## 2012-10-06 LAB — TSH: Thyroid Stimulating Horm: 0.418 u[IU]/mL — ABNORMAL LOW

## 2012-10-06 LAB — HEMOGLOBIN A1C: Hemoglobin A1C: 8.2 % — ABNORMAL HIGH (ref 4.2–6.3)

## 2012-10-06 LAB — IRON AND TIBC: Iron Bind.Cap.(Total): 244 ug/dL — ABNORMAL LOW (ref 250–450)

## 2012-10-06 LAB — CBC
MCV: 93 fL (ref 80–100)
RDW: 13.3 % (ref 11.5–14.5)

## 2012-10-07 DIAGNOSIS — R0602 Shortness of breath: Secondary | ICD-10-CM

## 2012-10-07 LAB — CBC WITH DIFFERENTIAL/PLATELET
Basophil %: 0.5 %
MCH: 30.6 pg (ref 26.0–34.0)
MCHC: 34 g/dL (ref 32.0–36.0)
MCV: 90 fL (ref 80–100)
Monocyte %: 8.9 %
RDW: 13.1 % (ref 11.5–14.5)

## 2012-10-07 LAB — COMPREHENSIVE METABOLIC PANEL
BUN: 38 mg/dL — ABNORMAL HIGH (ref 7–18)
Bilirubin,Total: 0.1 mg/dL — ABNORMAL LOW (ref 0.2–1.0)
Chloride: 115 mmol/L — ABNORMAL HIGH (ref 98–107)
EGFR (Non-African Amer.): 43 — ABNORMAL LOW
Glucose: 121 mg/dL — ABNORMAL HIGH (ref 65–99)
SGPT (ALT): 16 U/L (ref 12–78)
Total Protein: 5.4 g/dL — ABNORMAL LOW (ref 6.4–8.2)

## 2012-10-08 LAB — URINE CULTURE

## 2012-10-08 LAB — CBC WITH DIFFERENTIAL/PLATELET
Basophil %: 0.8 %
Eosinophil %: 1.9 %
HCT: 28.8 % — ABNORMAL LOW (ref 35.0–47.0)
HGB: 10 g/dL — ABNORMAL LOW (ref 12.0–16.0)
Lymphocyte #: 1.9 10*3/uL (ref 1.0–3.6)
MCV: 90 fL (ref 80–100)
Monocyte #: 1 x10 3/mm — ABNORMAL HIGH (ref 0.2–0.9)
Neutrophil %: 69.1 %
RDW: 14.1 % (ref 11.5–14.5)

## 2012-10-08 LAB — BASIC METABOLIC PANEL
Anion Gap: 5 — ABNORMAL LOW (ref 7–16)
Chloride: 117 mmol/L — ABNORMAL HIGH (ref 98–107)
Co2: 23 mmol/L (ref 21–32)
Creatinine: 0.96 mg/dL (ref 0.60–1.30)

## 2012-10-09 LAB — CREATININE, SERUM
Creatinine: 0.86 mg/dL (ref 0.60–1.30)
EGFR (African American): >60
EGFR (Non-African Amer.): >60

## 2012-10-09 LAB — VANCOMYCIN, TROUGH: Vancomycin, Trough: 13 ug/mL (ref 10–20)

## 2012-10-09 LAB — PHOSPHORUS: Phosphorus: 1.7 mg/dL — ABNORMAL LOW (ref 2.5–4.9)

## 2012-10-11 LAB — CULTURE, BLOOD (SINGLE)

## 2012-10-12 LAB — CBC WITH DIFFERENTIAL/PLATELET
Eosinophil #: 0.3 10*3/uL (ref 0.0–0.7)
HGB: 10.5 g/dL — ABNORMAL LOW (ref 12.0–16.0)
Lymphocyte #: 2.6 10*3/uL (ref 1.0–3.6)
Lymphocyte %: 28.7 %
MCV: 90 fL (ref 80–100)
Monocyte #: 1.1 x10 3/mm — ABNORMAL HIGH (ref 0.2–0.9)
Neutrophil #: 5 10*3/uL (ref 1.4–6.5)
RDW: 13.6 % (ref 11.5–14.5)

## 2012-10-12 LAB — BASIC METABOLIC PANEL
Chloride: 108 mmol/L — ABNORMAL HIGH (ref 98–107)
Creatinine: 0.77 mg/dL (ref 0.60–1.30)
EGFR (Non-African Amer.): >60
Osmolality: 283 (ref 275–301)
Potassium: 3.8 mmol/L (ref 3.5–5.1)
Sodium: 143 mmol/L (ref 136–145)

## 2012-10-13 LAB — PATHOLOGY REPORT

## 2012-10-18 ENCOUNTER — Emergency Department: Payer: Self-pay | Admitting: Emergency Medicine

## 2012-10-18 DIAGNOSIS — Z79899 Other long term (current) drug therapy: Secondary | ICD-10-CM | POA: Diagnosis not present

## 2012-10-18 DIAGNOSIS — R9431 Abnormal electrocardiogram [ECG] [EKG]: Secondary | ICD-10-CM | POA: Diagnosis not present

## 2012-10-18 DIAGNOSIS — E119 Type 2 diabetes mellitus without complications: Secondary | ICD-10-CM | POA: Diagnosis not present

## 2012-10-18 DIAGNOSIS — R1110 Vomiting, unspecified: Secondary | ICD-10-CM | POA: Diagnosis not present

## 2012-10-18 DIAGNOSIS — R112 Nausea with vomiting, unspecified: Secondary | ICD-10-CM | POA: Diagnosis not present

## 2012-10-18 DIAGNOSIS — I10 Essential (primary) hypertension: Secondary | ICD-10-CM | POA: Diagnosis not present

## 2012-10-18 LAB — COMPREHENSIVE METABOLIC PANEL
Albumin: 2.7 g/dL — ABNORMAL LOW (ref 3.4–5.0)
Alkaline Phosphatase: 128 U/L (ref 50–136)
Anion Gap: 5 — ABNORMAL LOW (ref 7–16)
BUN: 25 mg/dL — ABNORMAL HIGH (ref 7–18)
Bilirubin,Total: 0.3 mg/dL (ref 0.2–1.0)
Calcium, Total: 9.5 mg/dL (ref 8.5–10.1)
Chloride: 107 mmol/L (ref 98–107)
Co2: 28 mmol/L (ref 21–32)
Creatinine: 1.24 mg/dL (ref 0.60–1.30)
EGFR (African American): 57 — ABNORMAL LOW
EGFR (Non-African Amer.): 50 — ABNORMAL LOW
Glucose: 233 mg/dL — ABNORMAL HIGH (ref 65–99)
Osmolality: 291 (ref 275–301)
Potassium: 4.3 mmol/L (ref 3.5–5.1)
SGOT(AST): 16 U/L (ref 15–37)
SGPT (ALT): 20 U/L (ref 12–78)
Sodium: 140 mmol/L (ref 136–145)
Total Protein: 7.3 g/dL (ref 6.4–8.2)

## 2012-10-18 LAB — URINALYSIS, COMPLETE
Bacteria: NONE SEEN
Bilirubin,UR: NEGATIVE
Blood: NEGATIVE
Glucose,UR: >=500 mg/dL (ref 0–75)
Ketone: NEGATIVE
Leukocyte Esterase: NEGATIVE
Nitrite: NEGATIVE
Ph: 7 (ref 4.5–8.0)
Protein: NEGATIVE
RBC,UR: 1 /HPF (ref 0–5)
Specific Gravity: 1.014 (ref 1.003–1.030)
Squamous Epithelial: 1
WBC UR: 4 /HPF (ref 0–5)

## 2012-10-18 LAB — CBC
HCT: 41 % (ref 35.0–47.0)
HGB: 13.6 g/dL (ref 12.0–16.0)
MCH: 29.9 pg (ref 26.0–34.0)
MCHC: 33.1 g/dL (ref 32.0–36.0)

## 2012-10-18 LAB — PROTIME-INR
INR: 0.9
Prothrombin Time: 12.4 secs (ref 11.5–14.7)

## 2012-10-19 DIAGNOSIS — I10 Essential (primary) hypertension: Secondary | ICD-10-CM | POA: Diagnosis not present

## 2012-10-19 DIAGNOSIS — Z23 Encounter for immunization: Secondary | ICD-10-CM | POA: Diagnosis not present

## 2012-10-19 DIAGNOSIS — N200 Calculus of kidney: Secondary | ICD-10-CM | POA: Diagnosis not present

## 2012-10-19 DIAGNOSIS — A419 Sepsis, unspecified organism: Secondary | ICD-10-CM | POA: Diagnosis not present

## 2012-10-19 DIAGNOSIS — E7800 Pure hypercholesterolemia, unspecified: Secondary | ICD-10-CM | POA: Diagnosis not present

## 2012-10-24 DIAGNOSIS — D509 Iron deficiency anemia, unspecified: Secondary | ICD-10-CM | POA: Diagnosis not present

## 2012-10-24 DIAGNOSIS — R1310 Dysphagia, unspecified: Secondary | ICD-10-CM | POA: Diagnosis not present

## 2012-10-24 DIAGNOSIS — Z800 Family history of malignant neoplasm of digestive organs: Secondary | ICD-10-CM | POA: Diagnosis not present

## 2012-11-02 DIAGNOSIS — D509 Iron deficiency anemia, unspecified: Secondary | ICD-10-CM | POA: Diagnosis not present

## 2012-11-08 DIAGNOSIS — I10 Essential (primary) hypertension: Secondary | ICD-10-CM | POA: Diagnosis not present

## 2012-11-09 ENCOUNTER — Inpatient Hospital Stay: Payer: Self-pay | Admitting: Internal Medicine

## 2012-11-09 DIAGNOSIS — N9489 Other specified conditions associated with female genital organs and menstrual cycle: Secondary | ICD-10-CM | POA: Diagnosis not present

## 2012-11-09 DIAGNOSIS — R509 Fever, unspecified: Secondary | ICD-10-CM | POA: Diagnosis not present

## 2012-11-09 DIAGNOSIS — F079 Unspecified personality and behavioral disorder due to known physiological condition: Secondary | ICD-10-CM | POA: Diagnosis present

## 2012-11-09 DIAGNOSIS — R6510 Systemic inflammatory response syndrome (SIRS) of non-infectious origin without acute organ dysfunction: Secondary | ICD-10-CM | POA: Diagnosis present

## 2012-11-09 DIAGNOSIS — A419 Sepsis, unspecified organism: Secondary | ICD-10-CM | POA: Diagnosis not present

## 2012-11-09 DIAGNOSIS — N760 Acute vaginitis: Secondary | ICD-10-CM | POA: Diagnosis not present

## 2012-11-09 DIAGNOSIS — R29898 Other symptoms and signs involving the musculoskeletal system: Secondary | ICD-10-CM | POA: Diagnosis present

## 2012-11-09 DIAGNOSIS — E785 Hyperlipidemia, unspecified: Secondary | ICD-10-CM | POA: Diagnosis not present

## 2012-11-09 DIAGNOSIS — Z882 Allergy status to sulfonamides status: Secondary | ICD-10-CM | POA: Diagnosis not present

## 2012-11-09 DIAGNOSIS — R059 Cough, unspecified: Secondary | ICD-10-CM | POA: Diagnosis not present

## 2012-11-09 DIAGNOSIS — E119 Type 2 diabetes mellitus without complications: Secondary | ICD-10-CM | POA: Diagnosis not present

## 2012-11-09 DIAGNOSIS — L02219 Cutaneous abscess of trunk, unspecified: Secondary | ICD-10-CM | POA: Diagnosis not present

## 2012-11-09 DIAGNOSIS — M810 Age-related osteoporosis without current pathological fracture: Secondary | ICD-10-CM | POA: Diagnosis present

## 2012-11-09 DIAGNOSIS — Z833 Family history of diabetes mellitus: Secondary | ICD-10-CM | POA: Diagnosis not present

## 2012-11-09 DIAGNOSIS — I10 Essential (primary) hypertension: Secondary | ICD-10-CM | POA: Diagnosis not present

## 2012-11-09 DIAGNOSIS — Z87820 Personal history of traumatic brain injury: Secondary | ICD-10-CM | POA: Diagnosis not present

## 2012-11-09 DIAGNOSIS — Z880 Allergy status to penicillin: Secondary | ICD-10-CM | POA: Diagnosis not present

## 2012-11-09 DIAGNOSIS — N9089 Other specified noninflammatory disorders of vulva and perineum: Secondary | ICD-10-CM | POA: Diagnosis not present

## 2012-11-09 DIAGNOSIS — R471 Dysarthria and anarthria: Secondary | ICD-10-CM | POA: Diagnosis present

## 2012-11-09 DIAGNOSIS — F0390 Unspecified dementia without behavioral disturbance: Secondary | ICD-10-CM | POA: Diagnosis not present

## 2012-11-09 DIAGNOSIS — F329 Major depressive disorder, single episode, unspecified: Secondary | ICD-10-CM | POA: Diagnosis present

## 2012-11-09 DIAGNOSIS — K5900 Constipation, unspecified: Secondary | ICD-10-CM | POA: Diagnosis not present

## 2012-11-09 DIAGNOSIS — R05 Cough: Secondary | ICD-10-CM | POA: Diagnosis not present

## 2012-11-09 DIAGNOSIS — Z7902 Long term (current) use of antithrombotics/antiplatelets: Secondary | ICD-10-CM | POA: Diagnosis not present

## 2012-11-09 LAB — CBC WITH DIFFERENTIAL/PLATELET
Basophil #: 0.1 10*3/uL (ref 0.0–0.1)
Eosinophil #: 0 10*3/uL (ref 0.0–0.7)
HCT: 31.8 % — ABNORMAL LOW (ref 35.0–47.0)
HGB: 10.4 g/dL — ABNORMAL LOW (ref 12.0–16.0)
Lymphocyte #: 1 10*3/uL (ref 1.0–3.6)
Lymphocyte %: 4.8 %
MCHC: 32.7 g/dL (ref 32.0–36.0)
MCV: 86 fL (ref 80–100)
Monocyte #: 1.4 x10 3/mm — ABNORMAL HIGH (ref 0.2–0.9)
Neutrophil %: 88.1 %
RBC: 3.72 10*6/uL — ABNORMAL LOW (ref 3.80–5.20)
WBC: 21.2 10*3/uL — ABNORMAL HIGH (ref 3.6–11.0)

## 2012-11-09 LAB — COMPREHENSIVE METABOLIC PANEL
Anion Gap: 5 — ABNORMAL LOW (ref 7–16)
BUN: 9 mg/dL (ref 7–18)
Bilirubin,Total: 0.3 mg/dL (ref 0.2–1.0)
Calcium, Total: 9.6 mg/dL (ref 8.5–10.1)
EGFR (African American): >60
EGFR (Non-African Amer.): >60
Glucose: 208 mg/dL — ABNORMAL HIGH (ref 65–99)
Osmolality: 269 (ref 275–301)

## 2012-11-09 LAB — URINALYSIS, COMPLETE
Blood: NEGATIVE
Ketone: NEGATIVE
Leukocyte Esterase: NEGATIVE
Nitrite: NEGATIVE
Ph: 7 (ref 4.5–8.0)

## 2012-11-10 LAB — COMPREHENSIVE METABOLIC PANEL
BUN: 8 mg/dL (ref 7–18)
Bilirubin,Total: 0.3 mg/dL (ref 0.2–1.0)
Chloride: 107 mmol/L (ref 98–107)
Co2: 28 mmol/L (ref 21–32)
EGFR (African American): >60
EGFR (Non-African Amer.): >60
Glucose: 120 mg/dL — ABNORMAL HIGH (ref 65–99)
Potassium: 3.8 mmol/L (ref 3.5–5.1)
SGPT (ALT): 19 U/L (ref 12–78)
Sodium: 139 mmol/L (ref 136–145)
Total Protein: 6.5 g/dL (ref 6.4–8.2)

## 2012-11-10 LAB — CBC WITH DIFFERENTIAL/PLATELET
Basophil #: 0.1 10*3/uL (ref 0.0–0.1)
Eosinophil %: 0.6 %
HGB: 9 g/dL — ABNORMAL LOW (ref 12.0–16.0)
Lymphocyte %: 13.1 %
MCHC: 34.5 g/dL (ref 32.0–36.0)
MCV: 84 fL (ref 80–100)
Monocyte #: 1.3 x10 3/mm — ABNORMAL HIGH (ref 0.2–0.9)
Monocyte %: 6.4 %
Neutrophil #: 16.6 10*3/uL — ABNORMAL HIGH (ref 1.4–6.5)
Platelet: 243 10*3/uL (ref 150–440)
RBC: 3.09 10*6/uL — ABNORMAL LOW (ref 3.80–5.20)

## 2012-11-10 LAB — URINE CULTURE

## 2012-11-11 LAB — CBC WITH DIFFERENTIAL/PLATELET
Basophil #: 0.1 10*3/uL (ref 0.0–0.1)
Basophil %: 0.6 %
Eosinophil #: 0.3 10*3/uL (ref 0.0–0.7)
Eosinophil %: 1.7 %
HGB: 9.2 g/dL — ABNORMAL LOW (ref 12.0–16.0)
Lymphocyte #: 2.2 10*3/uL (ref 1.0–3.6)
Lymphocyte %: 13.5 %
MCH: 28 pg (ref 26.0–34.0)
MCHC: 33.1 g/dL (ref 32.0–36.0)
Monocyte %: 6.7 %
Neutrophil #: 12.4 10*3/uL — ABNORMAL HIGH (ref 1.4–6.5)
Neutrophil %: 77.5 %
Platelet: 252 10*3/uL (ref 150–440)
RBC: 3.27 10*6/uL — ABNORMAL LOW (ref 3.80–5.20)
RDW: 14.9 % — ABNORMAL HIGH (ref 11.5–14.5)
WBC: 15.9 10*3/uL — ABNORMAL HIGH (ref 3.6–11.0)

## 2012-11-12 LAB — CBC WITH DIFFERENTIAL/PLATELET
Basophil #: 0.1 10*3/uL (ref 0.0–0.1)
Basophil %: 1 %
Eosinophil #: 0.3 10*3/uL (ref 0.0–0.7)
Eosinophil %: 2.3 %
HCT: 26.7 % — ABNORMAL LOW (ref 35.0–47.0)
HGB: 8.8 g/dL — ABNORMAL LOW (ref 12.0–16.0)
Lymphocyte %: 12.5 %
MCV: 85 fL (ref 80–100)
Monocyte #: 1 x10 3/mm — ABNORMAL HIGH (ref 0.2–0.9)
Neutrophil %: 77.2 %
Platelet: 278 10*3/uL (ref 150–440)
RBC: 3.15 10*6/uL — ABNORMAL LOW (ref 3.80–5.20)
RDW: 15.2 % — ABNORMAL HIGH (ref 11.5–14.5)
WBC: 13.9 10*3/uL — ABNORMAL HIGH (ref 3.6–11.0)

## 2012-11-13 LAB — VANCOMYCIN, TROUGH: Vancomycin, Trough: 12 ug/mL (ref 10–20)

## 2012-11-13 LAB — CREATININE, SERUM
Creatinine: 0.83 mg/dL (ref 0.60–1.30)
EGFR (Non-African Amer.): >60

## 2012-11-14 LAB — CULTURE, BLOOD (SINGLE)

## 2012-11-15 LAB — CULTURE, BLOOD (SINGLE)

## 2012-11-16 DIAGNOSIS — N200 Calculus of kidney: Secondary | ICD-10-CM | POA: Diagnosis not present

## 2012-11-25 ENCOUNTER — Ambulatory Visit: Payer: Self-pay | Admitting: Family Medicine

## 2012-11-25 DIAGNOSIS — R922 Inconclusive mammogram: Secondary | ICD-10-CM | POA: Diagnosis not present

## 2012-11-25 DIAGNOSIS — Z1231 Encounter for screening mammogram for malignant neoplasm of breast: Secondary | ICD-10-CM | POA: Diagnosis not present

## 2012-11-29 DIAGNOSIS — F329 Major depressive disorder, single episode, unspecified: Secondary | ICD-10-CM | POA: Diagnosis not present

## 2012-11-30 DIAGNOSIS — A419 Sepsis, unspecified organism: Secondary | ICD-10-CM | POA: Diagnosis not present

## 2012-11-30 DIAGNOSIS — IMO0002 Reserved for concepts with insufficient information to code with codable children: Secondary | ICD-10-CM | POA: Diagnosis not present

## 2012-11-30 DIAGNOSIS — I10 Essential (primary) hypertension: Secondary | ICD-10-CM | POA: Diagnosis not present

## 2012-12-07 DIAGNOSIS — M62838 Other muscle spasm: Secondary | ICD-10-CM | POA: Diagnosis not present

## 2012-12-14 DIAGNOSIS — E119 Type 2 diabetes mellitus without complications: Secondary | ICD-10-CM | POA: Diagnosis not present

## 2012-12-14 DIAGNOSIS — R1310 Dysphagia, unspecified: Secondary | ICD-10-CM | POA: Diagnosis not present

## 2012-12-14 DIAGNOSIS — R21 Rash and other nonspecific skin eruption: Secondary | ICD-10-CM | POA: Diagnosis not present

## 2012-12-14 DIAGNOSIS — A419 Sepsis, unspecified organism: Secondary | ICD-10-CM | POA: Diagnosis not present

## 2012-12-14 DIAGNOSIS — IMO0002 Reserved for concepts with insufficient information to code with codable children: Secondary | ICD-10-CM | POA: Diagnosis not present

## 2012-12-14 DIAGNOSIS — I10 Essential (primary) hypertension: Secondary | ICD-10-CM | POA: Diagnosis not present

## 2012-12-28 ENCOUNTER — Ambulatory Visit: Payer: Self-pay | Admitting: Family Medicine

## 2012-12-28 ENCOUNTER — Ambulatory Visit: Payer: Self-pay | Admitting: Podiatry

## 2012-12-28 DIAGNOSIS — R1310 Dysphagia, unspecified: Secondary | ICD-10-CM | POA: Diagnosis not present

## 2012-12-29 ENCOUNTER — Ambulatory Visit: Payer: Self-pay | Admitting: Family Medicine

## 2012-12-29 DIAGNOSIS — N63 Unspecified lump in unspecified breast: Secondary | ICD-10-CM | POA: Diagnosis not present

## 2012-12-29 DIAGNOSIS — N6459 Other signs and symptoms in breast: Secondary | ICD-10-CM | POA: Diagnosis not present

## 2013-01-06 DIAGNOSIS — C44319 Basal cell carcinoma of skin of other parts of face: Secondary | ICD-10-CM | POA: Diagnosis not present

## 2013-01-06 DIAGNOSIS — D485 Neoplasm of uncertain behavior of skin: Secondary | ICD-10-CM | POA: Diagnosis not present

## 2013-01-17 DIAGNOSIS — K922 Gastrointestinal hemorrhage, unspecified: Secondary | ICD-10-CM | POA: Diagnosis not present

## 2013-01-17 DIAGNOSIS — Z800 Family history of malignant neoplasm of digestive organs: Secondary | ICD-10-CM | POA: Diagnosis not present

## 2013-01-24 DIAGNOSIS — N750 Cyst of Bartholin's gland: Secondary | ICD-10-CM | POA: Diagnosis not present

## 2013-02-16 DIAGNOSIS — C44319 Basal cell carcinoma of skin of other parts of face: Secondary | ICD-10-CM | POA: Diagnosis not present

## 2013-02-27 ENCOUNTER — Ambulatory Visit: Payer: Self-pay | Admitting: Podiatry

## 2013-02-28 DIAGNOSIS — F3289 Other specified depressive episodes: Secondary | ICD-10-CM | POA: Diagnosis not present

## 2013-02-28 DIAGNOSIS — F329 Major depressive disorder, single episode, unspecified: Secondary | ICD-10-CM | POA: Diagnosis not present

## 2013-04-13 ENCOUNTER — Ambulatory Visit: Payer: Self-pay | Admitting: Unknown Physician Specialty

## 2013-04-13 DIAGNOSIS — E119 Type 2 diabetes mellitus without complications: Secondary | ICD-10-CM | POA: Diagnosis not present

## 2013-04-13 DIAGNOSIS — K644 Residual hemorrhoidal skin tags: Secondary | ICD-10-CM | POA: Diagnosis not present

## 2013-04-13 DIAGNOSIS — Z79899 Other long term (current) drug therapy: Secondary | ICD-10-CM | POA: Diagnosis not present

## 2013-04-13 DIAGNOSIS — F3289 Other specified depressive episodes: Secondary | ICD-10-CM | POA: Diagnosis not present

## 2013-04-13 DIAGNOSIS — Z882 Allergy status to sulfonamides status: Secondary | ICD-10-CM | POA: Diagnosis not present

## 2013-04-13 DIAGNOSIS — K648 Other hemorrhoids: Secondary | ICD-10-CM | POA: Diagnosis not present

## 2013-04-13 DIAGNOSIS — Z880 Allergy status to penicillin: Secondary | ICD-10-CM | POA: Diagnosis not present

## 2013-04-13 DIAGNOSIS — Z87820 Personal history of traumatic brain injury: Secondary | ICD-10-CM | POA: Diagnosis not present

## 2013-04-13 DIAGNOSIS — Z800 Family history of malignant neoplasm of digestive organs: Secondary | ICD-10-CM | POA: Diagnosis not present

## 2013-04-13 DIAGNOSIS — F329 Major depressive disorder, single episode, unspecified: Secondary | ICD-10-CM | POA: Diagnosis not present

## 2013-04-13 DIAGNOSIS — Z1211 Encounter for screening for malignant neoplasm of colon: Secondary | ICD-10-CM | POA: Diagnosis not present

## 2013-04-13 DIAGNOSIS — I10 Essential (primary) hypertension: Secondary | ICD-10-CM | POA: Diagnosis not present

## 2013-04-13 DIAGNOSIS — K922 Gastrointestinal hemorrhage, unspecified: Secondary | ICD-10-CM | POA: Diagnosis not present

## 2013-04-17 DIAGNOSIS — Z23 Encounter for immunization: Secondary | ICD-10-CM | POA: Diagnosis not present

## 2013-04-17 DIAGNOSIS — E7800 Pure hypercholesterolemia, unspecified: Secondary | ICD-10-CM | POA: Diagnosis not present

## 2013-04-17 DIAGNOSIS — I10 Essential (primary) hypertension: Secondary | ICD-10-CM | POA: Diagnosis not present

## 2013-04-17 DIAGNOSIS — I70209 Unspecified atherosclerosis of native arteries of extremities, unspecified extremity: Secondary | ICD-10-CM | POA: Diagnosis not present

## 2013-04-17 DIAGNOSIS — E1159 Type 2 diabetes mellitus with other circulatory complications: Secondary | ICD-10-CM | POA: Diagnosis not present

## 2013-04-27 DIAGNOSIS — H2510 Age-related nuclear cataract, unspecified eye: Secondary | ICD-10-CM | POA: Diagnosis not present

## 2013-05-30 DIAGNOSIS — F329 Major depressive disorder, single episode, unspecified: Secondary | ICD-10-CM | POA: Diagnosis not present

## 2013-05-30 DIAGNOSIS — F3289 Other specified depressive episodes: Secondary | ICD-10-CM | POA: Diagnosis not present

## 2013-06-06 DIAGNOSIS — R195 Other fecal abnormalities: Secondary | ICD-10-CM | POA: Diagnosis not present

## 2013-06-09 ENCOUNTER — Inpatient Hospital Stay: Payer: Self-pay | Admitting: Internal Medicine

## 2013-06-09 DIAGNOSIS — R112 Nausea with vomiting, unspecified: Secondary | ICD-10-CM | POA: Diagnosis not present

## 2013-06-09 DIAGNOSIS — K2270 Barrett's esophagus without dysplasia: Secondary | ICD-10-CM | POA: Diagnosis present

## 2013-06-09 DIAGNOSIS — K921 Melena: Secondary | ICD-10-CM | POA: Diagnosis not present

## 2013-06-09 DIAGNOSIS — A419 Sepsis, unspecified organism: Secondary | ICD-10-CM | POA: Diagnosis not present

## 2013-06-09 DIAGNOSIS — J189 Pneumonia, unspecified organism: Secondary | ICD-10-CM | POA: Diagnosis not present

## 2013-06-09 DIAGNOSIS — R142 Eructation: Secondary | ICD-10-CM | POA: Diagnosis not present

## 2013-06-09 DIAGNOSIS — Z880 Allergy status to penicillin: Secondary | ICD-10-CM | POA: Diagnosis not present

## 2013-06-09 DIAGNOSIS — F0390 Unspecified dementia without behavioral disturbance: Secondary | ICD-10-CM | POA: Diagnosis not present

## 2013-06-09 DIAGNOSIS — R197 Diarrhea, unspecified: Secondary | ICD-10-CM | POA: Diagnosis not present

## 2013-06-09 DIAGNOSIS — R143 Flatulence: Secondary | ICD-10-CM | POA: Diagnosis not present

## 2013-06-09 DIAGNOSIS — Z87820 Personal history of traumatic brain injury: Secondary | ICD-10-CM | POA: Diagnosis not present

## 2013-06-09 DIAGNOSIS — K311 Adult hypertrophic pyloric stenosis: Secondary | ICD-10-CM | POA: Diagnosis not present

## 2013-06-09 DIAGNOSIS — N63 Unspecified lump in unspecified breast: Secondary | ICD-10-CM | POA: Diagnosis not present

## 2013-06-09 DIAGNOSIS — K2100 Gastro-esophageal reflux disease with esophagitis, without bleeding: Secondary | ICD-10-CM | POA: Diagnosis not present

## 2013-06-09 DIAGNOSIS — D62 Acute posthemorrhagic anemia: Secondary | ICD-10-CM | POA: Diagnosis present

## 2013-06-09 DIAGNOSIS — Z833 Family history of diabetes mellitus: Secondary | ICD-10-CM | POA: Diagnosis not present

## 2013-06-09 DIAGNOSIS — R141 Gas pain: Secondary | ICD-10-CM | POA: Diagnosis not present

## 2013-06-09 DIAGNOSIS — E876 Hypokalemia: Secondary | ICD-10-CM | POA: Diagnosis present

## 2013-06-09 DIAGNOSIS — F79 Unspecified intellectual disabilities: Secondary | ICD-10-CM | POA: Diagnosis present

## 2013-06-09 DIAGNOSIS — K922 Gastrointestinal hemorrhage, unspecified: Secondary | ICD-10-CM | POA: Diagnosis not present

## 2013-06-09 DIAGNOSIS — I959 Hypotension, unspecified: Secondary | ICD-10-CM | POA: Diagnosis not present

## 2013-06-09 DIAGNOSIS — J690 Pneumonitis due to inhalation of food and vomit: Secondary | ICD-10-CM | POA: Diagnosis not present

## 2013-06-09 DIAGNOSIS — M810 Age-related osteoporosis without current pathological fracture: Secondary | ICD-10-CM | POA: Diagnosis present

## 2013-06-09 DIAGNOSIS — Z882 Allergy status to sulfonamides status: Secondary | ICD-10-CM | POA: Diagnosis not present

## 2013-06-09 DIAGNOSIS — I10 Essential (primary) hypertension: Secondary | ICD-10-CM | POA: Diagnosis present

## 2013-06-09 DIAGNOSIS — E119 Type 2 diabetes mellitus without complications: Secondary | ICD-10-CM | POA: Diagnosis not present

## 2013-06-09 DIAGNOSIS — Z888 Allergy status to other drugs, medicaments and biological substances status: Secondary | ICD-10-CM | POA: Diagnosis not present

## 2013-06-09 DIAGNOSIS — R109 Unspecified abdominal pain: Secondary | ICD-10-CM | POA: Diagnosis not present

## 2013-06-09 DIAGNOSIS — K920 Hematemesis: Secondary | ICD-10-CM | POA: Diagnosis not present

## 2013-06-09 LAB — CBC WITH DIFFERENTIAL/PLATELET
Basophil #: 0 x10 3/mm 3
Basophil %: 0.2 %
Eosinophil #: 0 x10 3/mm 3
Eosinophil %: 0.2 %
HCT: 39.7 %
HGB: 12.6 g/dL
Lymphocyte %: 9 %
Lymphs Abs: 1.8 x10 3/mm 3
MCH: 25.6 pg — ABNORMAL LOW
MCHC: 31.6 g/dL — ABNORMAL LOW
MCV: 81 fL
Monocyte #: 1.5 "x10 3/mm " — ABNORMAL HIGH
Monocyte %: 7.3 %
Neutrophil #: 17.1 x10 3/mm 3 — ABNORMAL HIGH
Neutrophil %: 83.3 %
Platelet: 297 x10 3/mm 3
RBC: 4.91 X10 6/mm 3
RDW: 17.6 % — ABNORMAL HIGH
WBC: 20.5 x10 3/mm 3 — ABNORMAL HIGH

## 2013-06-09 LAB — COMPREHENSIVE METABOLIC PANEL WITH GFR
Albumin: 3.3 g/dL — ABNORMAL LOW
Alkaline Phosphatase: 103 U/L
Anion Gap: 7
BUN: 28 mg/dL — ABNORMAL HIGH
Bilirubin,Total: 0.2 mg/dL
Calcium, Total: 9.3 mg/dL
Chloride: 93 mmol/L — ABNORMAL LOW
Co2: 31 mmol/L
Creatinine: 0.99 mg/dL
EGFR (African American): >60
EGFR (Non-African Amer.): >60
Glucose: 201 mg/dL — ABNORMAL HIGH
Osmolality: 274
Potassium: 3.3 mmol/L — ABNORMAL LOW
SGOT(AST): 21 U/L
SGPT (ALT): 17 U/L
Sodium: 131 mmol/L — ABNORMAL LOW
Total Protein: 7.8 g/dL

## 2013-06-09 LAB — LIPASE, BLOOD: Lipase: 85 U/L (ref 73–393)

## 2013-06-10 LAB — CBC WITH DIFFERENTIAL/PLATELET
Basophil #: 0 10*3/uL (ref 0.0–0.1)
Basophil %: 0.4 %
EOS ABS: 0.1 10*3/uL (ref 0.0–0.7)
EOS PCT: 0.5 %
HCT: 26.1 % — ABNORMAL LOW (ref 35.0–47.0)
HGB: 8.3 g/dL — ABNORMAL LOW (ref 12.0–16.0)
LYMPHS PCT: 12 %
Lymphocyte #: 1.3 10*3/uL (ref 1.0–3.6)
MCH: 25.9 pg — AB (ref 26.0–34.0)
MCHC: 32 g/dL (ref 32.0–36.0)
MCV: 81 fL (ref 80–100)
Monocyte #: 0.8 x10 3/mm (ref 0.2–0.9)
Monocyte %: 6.8 %
NEUTROS PCT: 80.3 %
Neutrophil #: 9 10*3/uL — ABNORMAL HIGH (ref 1.4–6.5)
Platelet: 222 10*3/uL (ref 150–440)
RBC: 3.23 10*6/uL — ABNORMAL LOW (ref 3.80–5.20)
RDW: 17.2 % — ABNORMAL HIGH (ref 11.5–14.5)
WBC: 11.2 10*3/uL — AB (ref 3.6–11.0)

## 2013-06-10 LAB — BASIC METABOLIC PANEL
ANION GAP: 6 — AB (ref 7–16)
BUN: 28 mg/dL — ABNORMAL HIGH (ref 7–18)
CHLORIDE: 107 mmol/L (ref 98–107)
CO2: 24 mmol/L (ref 21–32)
Calcium, Total: 7.6 mg/dL — ABNORMAL LOW (ref 8.5–10.1)
Creatinine: 0.83 mg/dL (ref 0.60–1.30)
EGFR (Non-African Amer.): >60
Glucose: 146 mg/dL — ABNORMAL HIGH (ref 65–99)
Osmolality: 282 (ref 275–301)
Potassium: 4.1 mmol/L (ref 3.5–5.1)
SODIUM: 137 mmol/L (ref 136–145)

## 2013-06-11 LAB — CBC WITH DIFFERENTIAL/PLATELET
Basophil #: 0.1 10*3/uL (ref 0.0–0.1)
Basophil %: 1 %
EOS ABS: 0.3 10*3/uL (ref 0.0–0.7)
Eosinophil %: 4.7 %
HCT: 19.1 % — ABNORMAL LOW (ref 35.0–47.0)
HGB: 6.2 g/dL — ABNORMAL LOW (ref 12.0–16.0)
LYMPHS ABS: 2.5 10*3/uL (ref 1.0–3.6)
LYMPHS PCT: 35.4 %
MCH: 26.3 pg (ref 26.0–34.0)
MCHC: 32.4 g/dL (ref 32.0–36.0)
MCV: 81 fL (ref 80–100)
MONO ABS: 0.6 x10 3/mm (ref 0.2–0.9)
Monocyte %: 8 %
NEUTROS ABS: 3.5 10*3/uL (ref 1.4–6.5)
NEUTROS PCT: 50.9 %
Platelet: 174 10*3/uL (ref 150–440)
RBC: 2.36 10*6/uL — ABNORMAL LOW (ref 3.80–5.20)
RDW: 17.8 % — ABNORMAL HIGH (ref 11.5–14.5)
WBC: 6.9 10*3/uL (ref 3.6–11.0)

## 2013-06-11 LAB — BASIC METABOLIC PANEL
ANION GAP: 5 — AB (ref 7–16)
BUN: 12 mg/dL (ref 7–18)
CHLORIDE: 112 mmol/L — AB (ref 98–107)
CREATININE: 0.94 mg/dL (ref 0.60–1.30)
Calcium, Total: 7.7 mg/dL — ABNORMAL LOW (ref 8.5–10.1)
Co2: 25 mmol/L (ref 21–32)
EGFR (Non-African Amer.): >60
Glucose: 70 mg/dL (ref 65–99)
Osmolality: 281 (ref 275–301)
POTASSIUM: 3.4 mmol/L — AB (ref 3.5–5.1)
Sodium: 142 mmol/L (ref 136–145)

## 2013-06-12 LAB — CBC WITH DIFFERENTIAL/PLATELET
Basophil #: 0.1 10*3/uL (ref 0.0–0.1)
Basophil %: 0.9 %
EOS ABS: 0.3 10*3/uL (ref 0.0–0.7)
Eosinophil %: 3.8 %
HCT: 26.7 % — ABNORMAL LOW (ref 35.0–47.0)
HGB: 8.7 g/dL — ABNORMAL LOW (ref 12.0–16.0)
LYMPHS ABS: 1.8 10*3/uL (ref 1.0–3.6)
LYMPHS PCT: 26.7 %
MCH: 27 pg (ref 26.0–34.0)
MCHC: 32.7 g/dL (ref 32.0–36.0)
MCV: 83 fL (ref 80–100)
MONO ABS: 0.6 x10 3/mm (ref 0.2–0.9)
Monocyte %: 9.2 %
Neutrophil #: 4.1 10*3/uL (ref 1.4–6.5)
Neutrophil %: 59.4 %
Platelet: 164 10*3/uL (ref 150–440)
RBC: 3.23 10*6/uL — AB (ref 3.80–5.20)
RDW: 17 % — AB (ref 11.5–14.5)
WBC: 6.8 10*3/uL (ref 3.6–11.0)

## 2013-06-13 LAB — PATHOLOGY REPORT

## 2013-06-14 LAB — CULTURE, BLOOD (SINGLE)

## 2013-06-15 LAB — CULTURE, BLOOD (SINGLE)

## 2013-06-21 ENCOUNTER — Ambulatory Visit: Payer: Self-pay | Admitting: Family Medicine

## 2013-06-21 DIAGNOSIS — R059 Cough, unspecified: Secondary | ICD-10-CM | POA: Diagnosis not present

## 2013-06-21 DIAGNOSIS — J690 Pneumonitis due to inhalation of food and vomit: Secondary | ICD-10-CM | POA: Diagnosis not present

## 2013-06-21 DIAGNOSIS — R05 Cough: Secondary | ICD-10-CM | POA: Diagnosis not present

## 2013-06-24 DIAGNOSIS — I69959 Hemiplegia and hemiparesis following unspecified cerebrovascular disease affecting unspecified side: Secondary | ICD-10-CM | POA: Diagnosis not present

## 2013-06-24 DIAGNOSIS — M6281 Muscle weakness (generalized): Secondary | ICD-10-CM | POA: Diagnosis not present

## 2013-06-29 DIAGNOSIS — K920 Hematemesis: Secondary | ICD-10-CM | POA: Diagnosis not present

## 2013-06-29 DIAGNOSIS — M6281 Muscle weakness (generalized): Secondary | ICD-10-CM | POA: Diagnosis not present

## 2013-06-29 DIAGNOSIS — I69959 Hemiplegia and hemiparesis following unspecified cerebrovascular disease affecting unspecified side: Secondary | ICD-10-CM | POA: Diagnosis not present

## 2013-06-29 DIAGNOSIS — K2270 Barrett's esophagus without dysplasia: Secondary | ICD-10-CM | POA: Diagnosis not present

## 2013-07-07 DIAGNOSIS — M6281 Muscle weakness (generalized): Secondary | ICD-10-CM | POA: Diagnosis not present

## 2013-07-07 DIAGNOSIS — I69959 Hemiplegia and hemiparesis following unspecified cerebrovascular disease affecting unspecified side: Secondary | ICD-10-CM | POA: Diagnosis not present

## 2013-07-12 DIAGNOSIS — M6281 Muscle weakness (generalized): Secondary | ICD-10-CM | POA: Diagnosis not present

## 2013-07-12 DIAGNOSIS — I69959 Hemiplegia and hemiparesis following unspecified cerebrovascular disease affecting unspecified side: Secondary | ICD-10-CM | POA: Diagnosis not present

## 2013-07-21 DIAGNOSIS — M6281 Muscle weakness (generalized): Secondary | ICD-10-CM | POA: Diagnosis not present

## 2013-07-21 DIAGNOSIS — I69959 Hemiplegia and hemiparesis following unspecified cerebrovascular disease affecting unspecified side: Secondary | ICD-10-CM | POA: Diagnosis not present

## 2013-07-24 DIAGNOSIS — I69959 Hemiplegia and hemiparesis following unspecified cerebrovascular disease affecting unspecified side: Secondary | ICD-10-CM | POA: Diagnosis not present

## 2013-07-24 DIAGNOSIS — M6281 Muscle weakness (generalized): Secondary | ICD-10-CM | POA: Diagnosis not present

## 2013-07-31 DIAGNOSIS — I69959 Hemiplegia and hemiparesis following unspecified cerebrovascular disease affecting unspecified side: Secondary | ICD-10-CM | POA: Diagnosis not present

## 2013-07-31 DIAGNOSIS — M6281 Muscle weakness (generalized): Secondary | ICD-10-CM | POA: Diagnosis not present

## 2013-08-03 DIAGNOSIS — R197 Diarrhea, unspecified: Secondary | ICD-10-CM | POA: Diagnosis not present

## 2013-08-08 DIAGNOSIS — R197 Diarrhea, unspecified: Secondary | ICD-10-CM | POA: Diagnosis not present

## 2013-08-16 ENCOUNTER — Ambulatory Visit: Payer: Self-pay | Admitting: Family Medicine

## 2013-08-16 DIAGNOSIS — R928 Other abnormal and inconclusive findings on diagnostic imaging of breast: Secondary | ICD-10-CM | POA: Diagnosis not present

## 2013-08-16 DIAGNOSIS — N63 Unspecified lump in unspecified breast: Secondary | ICD-10-CM | POA: Diagnosis not present

## 2013-08-16 DIAGNOSIS — M6281 Muscle weakness (generalized): Secondary | ICD-10-CM | POA: Diagnosis not present

## 2013-08-16 DIAGNOSIS — I69959 Hemiplegia and hemiparesis following unspecified cerebrovascular disease affecting unspecified side: Secondary | ICD-10-CM | POA: Diagnosis not present

## 2013-08-21 DIAGNOSIS — M6281 Muscle weakness (generalized): Secondary | ICD-10-CM | POA: Diagnosis not present

## 2013-08-21 DIAGNOSIS — I69959 Hemiplegia and hemiparesis following unspecified cerebrovascular disease affecting unspecified side: Secondary | ICD-10-CM | POA: Diagnosis not present

## 2013-08-24 DIAGNOSIS — E7800 Pure hypercholesterolemia, unspecified: Secondary | ICD-10-CM | POA: Diagnosis not present

## 2013-08-24 DIAGNOSIS — R197 Diarrhea, unspecified: Secondary | ICD-10-CM | POA: Diagnosis not present

## 2013-08-24 DIAGNOSIS — E119 Type 2 diabetes mellitus without complications: Secondary | ICD-10-CM | POA: Diagnosis not present

## 2013-08-24 DIAGNOSIS — E782 Mixed hyperlipidemia: Secondary | ICD-10-CM | POA: Diagnosis not present

## 2013-08-24 DIAGNOSIS — I10 Essential (primary) hypertension: Secondary | ICD-10-CM | POA: Diagnosis not present

## 2013-08-25 DIAGNOSIS — W010XXA Fall on same level from slipping, tripping and stumbling without subsequent striking against object, initial encounter: Secondary | ICD-10-CM | POA: Diagnosis not present

## 2013-08-25 DIAGNOSIS — S8000XA Contusion of unspecified knee, initial encounter: Secondary | ICD-10-CM | POA: Diagnosis not present

## 2013-08-25 DIAGNOSIS — Z711 Person with feared health complaint in whom no diagnosis is made: Secondary | ICD-10-CM | POA: Diagnosis not present

## 2013-08-29 DIAGNOSIS — F3289 Other specified depressive episodes: Secondary | ICD-10-CM | POA: Diagnosis not present

## 2013-08-29 DIAGNOSIS — F329 Major depressive disorder, single episode, unspecified: Secondary | ICD-10-CM | POA: Diagnosis not present

## 2013-08-30 DIAGNOSIS — M6281 Muscle weakness (generalized): Secondary | ICD-10-CM | POA: Diagnosis not present

## 2013-08-30 DIAGNOSIS — I69959 Hemiplegia and hemiparesis following unspecified cerebrovascular disease affecting unspecified side: Secondary | ICD-10-CM | POA: Diagnosis not present

## 2013-09-04 DIAGNOSIS — I69959 Hemiplegia and hemiparesis following unspecified cerebrovascular disease affecting unspecified side: Secondary | ICD-10-CM | POA: Diagnosis not present

## 2013-09-04 DIAGNOSIS — M6281 Muscle weakness (generalized): Secondary | ICD-10-CM | POA: Diagnosis not present

## 2013-09-11 DIAGNOSIS — I69959 Hemiplegia and hemiparesis following unspecified cerebrovascular disease affecting unspecified side: Secondary | ICD-10-CM | POA: Diagnosis not present

## 2013-09-11 DIAGNOSIS — M6281 Muscle weakness (generalized): Secondary | ICD-10-CM | POA: Diagnosis not present

## 2013-09-13 ENCOUNTER — Ambulatory Visit: Payer: Medicare Other | Admitting: Podiatry

## 2013-09-18 ENCOUNTER — Ambulatory Visit: Payer: Medicare Other | Admitting: Podiatry

## 2013-09-18 DIAGNOSIS — M6281 Muscle weakness (generalized): Secondary | ICD-10-CM | POA: Diagnosis not present

## 2013-09-18 DIAGNOSIS — I69959 Hemiplegia and hemiparesis following unspecified cerebrovascular disease affecting unspecified side: Secondary | ICD-10-CM | POA: Diagnosis not present

## 2013-09-27 ENCOUNTER — Ambulatory Visit: Payer: Medicare Other | Admitting: Podiatry

## 2013-10-03 DIAGNOSIS — I69959 Hemiplegia and hemiparesis following unspecified cerebrovascular disease affecting unspecified side: Secondary | ICD-10-CM | POA: Diagnosis not present

## 2013-10-03 DIAGNOSIS — M6281 Muscle weakness (generalized): Secondary | ICD-10-CM | POA: Diagnosis not present

## 2013-10-09 DIAGNOSIS — M6281 Muscle weakness (generalized): Secondary | ICD-10-CM | POA: Diagnosis not present

## 2013-10-09 DIAGNOSIS — I69959 Hemiplegia and hemiparesis following unspecified cerebrovascular disease affecting unspecified side: Secondary | ICD-10-CM | POA: Diagnosis not present

## 2013-10-11 ENCOUNTER — Ambulatory Visit: Payer: Medicare Other | Admitting: Podiatry

## 2013-10-12 ENCOUNTER — Ambulatory Visit (INDEPENDENT_AMBULATORY_CARE_PROVIDER_SITE_OTHER): Payer: Medicare Other | Admitting: Podiatry

## 2013-10-12 ENCOUNTER — Encounter: Payer: Self-pay | Admitting: Podiatry

## 2013-10-12 VITALS — BP 139/84 | HR 113 | Resp 16 | Ht 67.0 in | Wt 180.0 lb

## 2013-10-12 DIAGNOSIS — M79609 Pain in unspecified limb: Secondary | ICD-10-CM

## 2013-10-12 DIAGNOSIS — B351 Tinea unguium: Secondary | ICD-10-CM

## 2013-10-12 DIAGNOSIS — M79676 Pain in unspecified toe(s): Secondary | ICD-10-CM

## 2013-10-12 NOTE — Progress Notes (Signed)
Patient ID: [name] [name], female   DOB: 25-Sep-1960, 53 y.o.   MRN: [id number]  Subjective: [name] [name], 53 year old female, presents the office today for diabetic risk assessment and debridement of painful elongated toenails. She presents today with a representative from her facility. Representative states that her right foot is chronically somewhat red due to "bad blood flow". States that she previously had been seen by vascular. They both denies any recent changes other than painful elongated nails. No changes since her last appointment. No new complaints. Blood sugar typically runs in the 130's.   Objective: Awake, Alert, NAD; presents in a wheelchair.  DP/PT pulses decreased, CRT delayed b/l. (this apparently is not new) Dependent rubar to the right foot.  There is no increased warmth.  Nails hypertrophic, dystrophic, elongated, brittle x10. No surrounding erythema or drainage. No open skin lesions. MMT decreased No leg pain, swelling, warmth.  Assessment: 53 year old female with symptomatic onychomycosis, PVD.  Plan: -Treatment options discussed including alternatives, risks, complications. -Nail sharply debrided E67 without complications. -Recommended follow up with vascular surgery for PVD. -Discussed the importance of daily foot inspection. -Followup in 3 months or sooner if any palms are to arise or any changes symptoms. In the meantime call any questions, concerns.

## 2013-10-31 DIAGNOSIS — M6281 Muscle weakness (generalized): Secondary | ICD-10-CM | POA: Diagnosis not present

## 2013-10-31 DIAGNOSIS — I69152 Hemiplegia and hemiparesis following nontraumatic intracerebral hemorrhage affecting left dominant side: Secondary | ICD-10-CM | POA: Diagnosis not present

## 2013-11-07 DIAGNOSIS — M6281 Muscle weakness (generalized): Secondary | ICD-10-CM | POA: Diagnosis not present

## 2013-11-07 DIAGNOSIS — I69152 Hemiplegia and hemiparesis following nontraumatic intracerebral hemorrhage affecting left dominant side: Secondary | ICD-10-CM | POA: Diagnosis not present

## 2013-11-14 DIAGNOSIS — I69152 Hemiplegia and hemiparesis following nontraumatic intracerebral hemorrhage affecting left dominant side: Secondary | ICD-10-CM | POA: Diagnosis not present

## 2013-11-14 DIAGNOSIS — M6281 Muscle weakness (generalized): Secondary | ICD-10-CM | POA: Diagnosis not present

## 2013-11-21 DIAGNOSIS — I69152 Hemiplegia and hemiparesis following nontraumatic intracerebral hemorrhage affecting left dominant side: Secondary | ICD-10-CM | POA: Diagnosis not present

## 2013-11-21 DIAGNOSIS — M6281 Muscle weakness (generalized): Secondary | ICD-10-CM | POA: Diagnosis not present

## 2013-11-28 DIAGNOSIS — F329 Major depressive disorder, single episode, unspecified: Secondary | ICD-10-CM | POA: Diagnosis not present

## 2013-11-29 DIAGNOSIS — Z1389 Encounter for screening for other disorder: Secondary | ICD-10-CM | POA: Diagnosis not present

## 2013-11-29 DIAGNOSIS — Z9181 History of falling: Secondary | ICD-10-CM | POA: Diagnosis not present

## 2013-11-29 DIAGNOSIS — Z0000 Encounter for general adult medical examination without abnormal findings: Secondary | ICD-10-CM | POA: Diagnosis not present

## 2013-11-29 DIAGNOSIS — Z780 Asymptomatic menopausal state: Secondary | ICD-10-CM | POA: Diagnosis not present

## 2013-11-29 DIAGNOSIS — Z23 Encounter for immunization: Secondary | ICD-10-CM | POA: Diagnosis not present

## 2013-11-29 DIAGNOSIS — Z1211 Encounter for screening for malignant neoplasm of colon: Secondary | ICD-10-CM | POA: Diagnosis not present

## 2013-12-05 DIAGNOSIS — I69152 Hemiplegia and hemiparesis following nontraumatic intracerebral hemorrhage affecting left dominant side: Secondary | ICD-10-CM | POA: Diagnosis not present

## 2013-12-05 DIAGNOSIS — M6281 Muscle weakness (generalized): Secondary | ICD-10-CM | POA: Diagnosis not present

## 2013-12-06 DIAGNOSIS — M8580 Other specified disorders of bone density and structure, unspecified site: Secondary | ICD-10-CM | POA: Diagnosis not present

## 2013-12-06 DIAGNOSIS — I10 Essential (primary) hypertension: Secondary | ICD-10-CM | POA: Diagnosis not present

## 2013-12-06 DIAGNOSIS — F329 Major depressive disorder, single episode, unspecified: Secondary | ICD-10-CM | POA: Diagnosis not present

## 2013-12-06 DIAGNOSIS — E780 Pure hypercholesterolemia: Secondary | ICD-10-CM | POA: Diagnosis not present

## 2013-12-12 DIAGNOSIS — I69152 Hemiplegia and hemiparesis following nontraumatic intracerebral hemorrhage affecting left dominant side: Secondary | ICD-10-CM | POA: Diagnosis not present

## 2013-12-12 DIAGNOSIS — M6281 Muscle weakness (generalized): Secondary | ICD-10-CM | POA: Diagnosis not present

## 2013-12-19 DIAGNOSIS — I69152 Hemiplegia and hemiparesis following nontraumatic intracerebral hemorrhage affecting left dominant side: Secondary | ICD-10-CM | POA: Diagnosis not present

## 2013-12-19 DIAGNOSIS — M6281 Muscle weakness (generalized): Secondary | ICD-10-CM | POA: Diagnosis not present

## 2013-12-26 DIAGNOSIS — E780 Pure hypercholesterolemia: Secondary | ICD-10-CM | POA: Diagnosis not present

## 2013-12-26 DIAGNOSIS — S069X9A Unspecified intracranial injury with loss of consciousness of unspecified duration, initial encounter: Secondary | ICD-10-CM | POA: Diagnosis not present

## 2013-12-26 DIAGNOSIS — I70209 Unspecified atherosclerosis of native arteries of extremities, unspecified extremity: Secondary | ICD-10-CM | POA: Diagnosis not present

## 2013-12-26 DIAGNOSIS — I10 Essential (primary) hypertension: Secondary | ICD-10-CM | POA: Diagnosis not present

## 2013-12-26 DIAGNOSIS — E1159 Type 2 diabetes mellitus with other circulatory complications: Secondary | ICD-10-CM | POA: Diagnosis not present

## 2013-12-26 DIAGNOSIS — G8220 Paraplegia, unspecified: Secondary | ICD-10-CM | POA: Diagnosis not present

## 2013-12-29 DIAGNOSIS — M6281 Muscle weakness (generalized): Secondary | ICD-10-CM | POA: Diagnosis not present

## 2013-12-29 DIAGNOSIS — I69152 Hemiplegia and hemiparesis following nontraumatic intracerebral hemorrhage affecting left dominant side: Secondary | ICD-10-CM | POA: Diagnosis not present

## 2014-01-15 DIAGNOSIS — M6281 Muscle weakness (generalized): Secondary | ICD-10-CM | POA: Diagnosis not present

## 2014-01-15 DIAGNOSIS — I69152 Hemiplegia and hemiparesis following nontraumatic intracerebral hemorrhage affecting left dominant side: Secondary | ICD-10-CM | POA: Diagnosis not present

## 2014-01-16 ENCOUNTER — Ambulatory Visit (INDEPENDENT_AMBULATORY_CARE_PROVIDER_SITE_OTHER): Payer: Medicare Other | Admitting: Podiatry

## 2014-01-16 ENCOUNTER — Ambulatory Visit: Payer: Self-pay | Admitting: Family Medicine

## 2014-01-16 ENCOUNTER — Encounter: Payer: Self-pay | Admitting: Podiatry

## 2014-01-16 VITALS — BP 138/96 | HR 92 | Resp 18

## 2014-01-16 DIAGNOSIS — B351 Tinea unguium: Secondary | ICD-10-CM | POA: Diagnosis not present

## 2014-01-16 DIAGNOSIS — M79676 Pain in unspecified toe(s): Secondary | ICD-10-CM

## 2014-01-16 DIAGNOSIS — Z780 Asymptomatic menopausal state: Secondary | ICD-10-CM | POA: Diagnosis not present

## 2014-01-16 DIAGNOSIS — Z13820 Encounter for screening for osteoporosis: Secondary | ICD-10-CM | POA: Diagnosis not present

## 2014-01-16 DIAGNOSIS — M810 Age-related osteoporosis without current pathological fracture: Secondary | ICD-10-CM | POA: Diagnosis not present

## 2014-01-21 NOTE — Progress Notes (Signed)
Patient ID: [name] [name], female   DOB: Sep 05, 1960, 53 y.o.   MRN: [id number]  Subjective: 53 year old female presents the office today for diabetic risk assessment and debridement of painful elongated toenails, especially with pressure. She presents today with a representative from her facility who states that she periodically will trim the nails between appointments. They both denies any recent changes or any new complaints.  Objective: Awake, Alert, NAD; presents in a wheelchair.  DP/PT pulses decreased, CRT delayed b/l. (this apparently is not new. Patient states she has "bad blood flow" and has been seen by vascular surgery) Nails hypertrophic, dystrophic, elongated, brittle x10. There is mild incurvation of the nails. No surrounding erythema or drainage.  No open skin lesions or pre-ulcerative lesion.  MMT decreased No leg pain, swelling, warmth.  Assessment: 53 year old female with symptomatic onychomycosis, PVD.  Plan: -Treatment options discussed including alternatives, risks, complications. -Nail sharply debrided Q96 without complications. -Continue follow up with vascular surgery -Discussed the importance of daily foot inspection. -Followup in 3 months or sooner if any problems are to arise or any changes symptoms. In the meantime call any questions, concerns.

## 2014-01-24 DIAGNOSIS — I69152 Hemiplegia and hemiparesis following nontraumatic intracerebral hemorrhage affecting left dominant side: Secondary | ICD-10-CM | POA: Diagnosis not present

## 2014-01-24 DIAGNOSIS — M6281 Muscle weakness (generalized): Secondary | ICD-10-CM | POA: Diagnosis not present

## 2014-01-30 DIAGNOSIS — I70209 Unspecified atherosclerosis of native arteries of extremities, unspecified extremity: Secondary | ICD-10-CM | POA: Diagnosis not present

## 2014-01-30 DIAGNOSIS — N390 Urinary tract infection, site not specified: Secondary | ICD-10-CM | POA: Diagnosis not present

## 2014-01-30 DIAGNOSIS — G8220 Paraplegia, unspecified: Secondary | ICD-10-CM | POA: Diagnosis not present

## 2014-01-30 DIAGNOSIS — I10 Essential (primary) hypertension: Secondary | ICD-10-CM | POA: Diagnosis not present

## 2014-01-30 DIAGNOSIS — E1159 Type 2 diabetes mellitus with other circulatory complications: Secondary | ICD-10-CM | POA: Diagnosis not present

## 2014-01-30 DIAGNOSIS — S069X9A Unspecified intracranial injury with loss of consciousness of unspecified duration, initial encounter: Secondary | ICD-10-CM | POA: Diagnosis not present

## 2014-01-30 DIAGNOSIS — E780 Pure hypercholesterolemia: Secondary | ICD-10-CM | POA: Diagnosis not present

## 2014-02-06 DIAGNOSIS — M6281 Muscle weakness (generalized): Secondary | ICD-10-CM | POA: Diagnosis not present

## 2014-02-06 DIAGNOSIS — I69152 Hemiplegia and hemiparesis following nontraumatic intracerebral hemorrhage affecting left dominant side: Secondary | ICD-10-CM | POA: Diagnosis not present

## 2014-02-13 DIAGNOSIS — I69152 Hemiplegia and hemiparesis following nontraumatic intracerebral hemorrhage affecting left dominant side: Secondary | ICD-10-CM | POA: Diagnosis not present

## 2014-02-13 DIAGNOSIS — M6281 Muscle weakness (generalized): Secondary | ICD-10-CM | POA: Diagnosis not present

## 2014-02-23 DIAGNOSIS — M6281 Muscle weakness (generalized): Secondary | ICD-10-CM | POA: Diagnosis not present

## 2014-02-23 DIAGNOSIS — I69152 Hemiplegia and hemiparesis following nontraumatic intracerebral hemorrhage affecting left dominant side: Secondary | ICD-10-CM | POA: Diagnosis not present

## 2014-02-27 DIAGNOSIS — F329 Major depressive disorder, single episode, unspecified: Secondary | ICD-10-CM | POA: Diagnosis not present

## 2014-03-07 DIAGNOSIS — R4689 Other symptoms and signs involving appearance and behavior: Secondary | ICD-10-CM | POA: Diagnosis not present

## 2014-03-07 DIAGNOSIS — R4189 Other symptoms and signs involving cognitive functions and awareness: Secondary | ICD-10-CM | POA: Diagnosis not present

## 2014-03-07 DIAGNOSIS — Z87820 Personal history of traumatic brain injury: Secondary | ICD-10-CM | POA: Diagnosis not present

## 2014-03-07 DIAGNOSIS — G308 Other Alzheimer's disease: Secondary | ICD-10-CM | POA: Diagnosis not present

## 2014-03-14 DIAGNOSIS — I69152 Hemiplegia and hemiparesis following nontraumatic intracerebral hemorrhage affecting left dominant side: Secondary | ICD-10-CM | POA: Diagnosis not present

## 2014-03-14 DIAGNOSIS — M6281 Muscle weakness (generalized): Secondary | ICD-10-CM | POA: Diagnosis not present

## 2014-03-21 DIAGNOSIS — I69152 Hemiplegia and hemiparesis following nontraumatic intracerebral hemorrhage affecting left dominant side: Secondary | ICD-10-CM | POA: Diagnosis not present

## 2014-03-21 DIAGNOSIS — M6281 Muscle weakness (generalized): Secondary | ICD-10-CM | POA: Diagnosis not present

## 2014-03-28 DIAGNOSIS — I69152 Hemiplegia and hemiparesis following nontraumatic intracerebral hemorrhage affecting left dominant side: Secondary | ICD-10-CM | POA: Diagnosis not present

## 2014-03-28 DIAGNOSIS — M6281 Muscle weakness (generalized): Secondary | ICD-10-CM | POA: Diagnosis not present

## 2014-04-03 ENCOUNTER — Ambulatory Visit: Payer: Self-pay | Admitting: Family Medicine

## 2014-04-03 DIAGNOSIS — I10 Essential (primary) hypertension: Secondary | ICD-10-CM | POA: Diagnosis not present

## 2014-04-03 DIAGNOSIS — E785 Hyperlipidemia, unspecified: Secondary | ICD-10-CM | POA: Diagnosis not present

## 2014-04-03 DIAGNOSIS — E118 Type 2 diabetes mellitus with unspecified complications: Secondary | ICD-10-CM | POA: Diagnosis not present

## 2014-04-03 DIAGNOSIS — S6992XA Unspecified injury of left wrist, hand and finger(s), initial encounter: Secondary | ICD-10-CM | POA: Diagnosis not present

## 2014-04-03 DIAGNOSIS — Y9389 Activity, other specified: Secondary | ICD-10-CM | POA: Diagnosis not present

## 2014-04-03 DIAGNOSIS — Y92121 Bathroom in nursing home as the place of occurrence of the external cause: Secondary | ICD-10-CM | POA: Diagnosis not present

## 2014-04-03 DIAGNOSIS — S6991XA Unspecified injury of right wrist, hand and finger(s), initial encounter: Secondary | ICD-10-CM | POA: Diagnosis not present

## 2014-04-03 DIAGNOSIS — M810 Age-related osteoporosis without current pathological fracture: Secondary | ICD-10-CM | POA: Diagnosis not present

## 2014-04-03 DIAGNOSIS — M25562 Pain in left knee: Secondary | ICD-10-CM | POA: Diagnosis not present

## 2014-04-03 DIAGNOSIS — Y999 Unspecified external cause status: Secondary | ICD-10-CM | POA: Diagnosis not present

## 2014-04-03 DIAGNOSIS — W1811XA Fall from or off toilet without subsequent striking against object, initial encounter: Secondary | ICD-10-CM | POA: Diagnosis not present

## 2014-04-03 DIAGNOSIS — M1712 Unilateral primary osteoarthritis, left knee: Secondary | ICD-10-CM | POA: Diagnosis not present

## 2014-04-03 DIAGNOSIS — R1310 Dysphagia, unspecified: Secondary | ICD-10-CM | POA: Diagnosis not present

## 2014-04-06 DIAGNOSIS — R4189 Other symptoms and signs involving cognitive functions and awareness: Secondary | ICD-10-CM | POA: Diagnosis not present

## 2014-04-06 DIAGNOSIS — R4689 Other symptoms and signs involving appearance and behavior: Secondary | ICD-10-CM | POA: Diagnosis not present

## 2014-04-11 DIAGNOSIS — M6281 Muscle weakness (generalized): Secondary | ICD-10-CM | POA: Diagnosis not present

## 2014-04-11 DIAGNOSIS — I69152 Hemiplegia and hemiparesis following nontraumatic intracerebral hemorrhage affecting left dominant side: Secondary | ICD-10-CM | POA: Diagnosis not present

## 2014-04-17 ENCOUNTER — Ambulatory Visit (INDEPENDENT_AMBULATORY_CARE_PROVIDER_SITE_OTHER): Payer: Medicare Other | Admitting: Podiatry

## 2014-04-17 DIAGNOSIS — B351 Tinea unguium: Secondary | ICD-10-CM

## 2014-04-17 DIAGNOSIS — B353 Tinea pedis: Secondary | ICD-10-CM

## 2014-04-17 DIAGNOSIS — M79676 Pain in unspecified toe(s): Secondary | ICD-10-CM

## 2014-04-17 MED ORDER — KETOCONAZOLE 2 % EX CREA: 1.0000 "application " | TOPICAL_CREAM | Freq: Every day | CUTANEOUS | Status: DC

## 2014-04-17 NOTE — Progress Notes (Signed)
Patient ID: [name] [name], female   DOB: 1961-01-21, 54 y.o.   MRN: [id number]   Subjective: [name] [name]  presents the office today for painful elongated toenails, which she is unable to do herself. She presents today with a representative from her facility who denies any recent redness or drainage from the nail sites. Denies any recent changes or any new complaints.  Objective: Awake, Alert, NAD; presents in a wheelchair.  DP/PT pulses decreased, CRT delayed b/l. (this apparently is not new. Patient states she has "bad blood flow" and has been seen by vascular surgery) Nails hypertrophic, dystrophic, elongated, brittle x10. There is mild incurvation of the nails. No surrounding erythema or drainage. There subjective tenderness on nails 1-5 bilaterally. There is DOB dry, peeling, scaly skin on the plantar right foot and interdigitally consistent with tinea pedis. No interdigital maceration.  No open skin lesions or pre-ulcerative lesion No other areas of tenderness to bilateral lower extremity is. No calf pain with compression, swelling, warmth, erythema.   Assessment: 54 year old female with symptomatic onychomycosis, PVD; tinea pedis  Plan: -Treatment options discussed including alternatives, risks, complications. -Nail sharply debrided X10 without complications. -Prescribed ketoconazole cream .-Discussed the importance of daily foot inspection. -Followup in 3 months or sooner if any problems are to arise or any changes symptoms. In the meantime call any questions, concerns.

## 2014-04-18 DIAGNOSIS — I69152 Hemiplegia and hemiparesis following nontraumatic intracerebral hemorrhage affecting left dominant side: Secondary | ICD-10-CM | POA: Diagnosis not present

## 2014-04-18 DIAGNOSIS — M6281 Muscle weakness (generalized): Secondary | ICD-10-CM | POA: Diagnosis not present

## 2014-04-19 ENCOUNTER — Ambulatory Visit: Payer: Self-pay | Admitting: Family Medicine

## 2014-04-19 DIAGNOSIS — R928 Other abnormal and inconclusive findings on diagnostic imaging of breast: Secondary | ICD-10-CM | POA: Diagnosis not present

## 2014-04-19 DIAGNOSIS — R921 Mammographic calcification found on diagnostic imaging of breast: Secondary | ICD-10-CM | POA: Diagnosis not present

## 2014-04-25 DIAGNOSIS — M6281 Muscle weakness (generalized): Secondary | ICD-10-CM | POA: Diagnosis not present

## 2014-04-25 DIAGNOSIS — I69152 Hemiplegia and hemiparesis following nontraumatic intracerebral hemorrhage affecting left dominant side: Secondary | ICD-10-CM | POA: Diagnosis not present

## 2014-04-30 DIAGNOSIS — M6281 Muscle weakness (generalized): Secondary | ICD-10-CM | POA: Diagnosis not present

## 2014-04-30 DIAGNOSIS — I69152 Hemiplegia and hemiparesis following nontraumatic intracerebral hemorrhage affecting left dominant side: Secondary | ICD-10-CM | POA: Diagnosis not present

## 2014-05-11 DIAGNOSIS — M6281 Muscle weakness (generalized): Secondary | ICD-10-CM | POA: Diagnosis not present

## 2014-05-11 DIAGNOSIS — I69152 Hemiplegia and hemiparesis following nontraumatic intracerebral hemorrhage affecting left dominant side: Secondary | ICD-10-CM | POA: Diagnosis not present

## 2014-05-18 DIAGNOSIS — I69152 Hemiplegia and hemiparesis following nontraumatic intracerebral hemorrhage affecting left dominant side: Secondary | ICD-10-CM | POA: Diagnosis not present

## 2014-05-18 DIAGNOSIS — M6281 Muscle weakness (generalized): Secondary | ICD-10-CM | POA: Diagnosis not present

## 2014-05-18 NOTE — Consult Note (Signed)
CC: hypotension, possible sepsis.  Since she has heme positive stool on exam and she is severely anemic and has hypotension and is on Levophed drip it would be a reasonable therapy to transfuse 2 units of blood over 4 hours each tonight.  Will discuss with patient and family.  Electronic Signatures: Manya Silvas (MD)  (Signed on 12-Sep-14 17:38)  Authored  Last Updated: 12-Sep-14 17:38 by Manya Silvas (MD)
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
## 2014-05-18 NOTE — Consult Note (Signed)
Chief Complaint:  Subjective/Chief Complaint no complaints this am. denies any flank pain. has been voiding into bed so difficult to strain urine.   VITAL SIGNS/ANCILLARY NOTES: **Vital Signs.:   31-Aug-14 05:34  Vital Signs Type Routine  Temperature Temperature (F) 98.4  Celsius 36.8  Temperature Source oral  Pulse Pulse 92  Respirations Respirations 18  Systolic BP Systolic BP 130  Diastolic BP (mmHg) Diastolic BP (mmHg) 83  Mean BP 99  Pulse Ox % Pulse Ox % 95  Pulse Ox Activity Level  At rest  Oxygen Delivery Room Air/ 21 %  *Intake and Output.:   Shift 31-Aug-14 07:00  Grand Totals Intake:  1012 Output:      Net:  [id number] 84 Hr.:  6962  IV (Primary)      In:  1012  Length of Stay Totals Intake:  2582 Output:      Net:  2582   Brief Assessment:  GEN no acute distress   Respiratory normal resp effort  clear BS   Gastrointestinal details normal Soft  Nontender  no CVAT bilaterally   Lab Results: Routine Micro:  29-Aug-14 21:04   Micro Text Report URINE CULTURE   COMMENT                   MIXED BACTERIAL ORGANISMS   COMMENT                   RESULTS SUGGESTIVE OF CONTAMINATION   ANTIBIOTIC                       Culture Comment MIXED BACTERIAL ORGANISMS  Specimen Source CLEAN CATCH    22:41   Micro Text Report BLOOD CULTURE   COMMENT                   NO GROWTH IN 18-24 HOURS   ANTIBIOTIC                       Micro Text Report BLOOD CULTURE   COMMENT                   NO GROWTH IN 18-24 HOURS   ANTIBIOTIC                       Culture Comment NO GROWTH IN 18-24 HOURS  Result(s) reported on 24 Sep 2012 at 10:00PM.  Culture Comment NO GROWTH IN 18-24 HOURS  Result(s) reported on 24 Sep 2012 at 10:00PM.  Routine Chem:  31-Aug-14 03:39   Glucose, Serum  116  BUN 15  Creatinine (comp) 0.99  Sodium, Serum 142  Potassium, Serum 3.6  Chloride, Serum  110  CO2, Serum 25  Calcium (Total), Serum  8.2  Anion Gap 7  Osmolality (calc) 285  eGFR (African  American) >60  eGFR (Non-African American) >60 (eGFR values <64m/min/1.73 m2 may be an indication of chronic kidney disease (CKD). Calculated eGFR is useful in patients with stable renal function. The eGFR calculation will not be reliable in acutely ill patients when serum creatinine is changing rapidly. It is not useful in  patients on dialysis. The eGFR calculation may not be applicable to patients at the low and high extremes of body sizes, pregnant women, and vegetarians.)  Magnesium, Serum 1.8 (1.8-2.4 THERAPEUTIC RANGE: 4-7 mg/dL TOXIC: > 10 mg/dL  -----------------------)  Routine Hem:  31-Aug-14 03:39   WBC (CBC) 8.0  RBC (CBC)  3.42  Hemoglobin (CBC)  10.6  Hematocrit (CBC)  31.2  Platelet Count (CBC)  141  MCV 91  MCH 30.9  MCHC 33.9  RDW 13.4  Neutrophil % 66.2  Lymphocyte % 21.6  Monocyte % 10.3  Eosinophil % 1.6  Basophil % 0.3  Neutrophil # 5.3  Lymphocyte # 1.7  Monocyte # 0.8  Eosinophil # 0.1  Basophil # 0.0 (Result(s) reported on 25 Sep 2012 at 04:30AM.)   Assessment/Plan:  Assessment/Plan:  Assessment 54 y/o F with ureterolithiasis. leukocytosis resolved and Cr improved to 1.0. no CVAT tenderness. afebrile vitals WNL, urine culture mixed flora, BCx NGTD   Plan - would recommend followup with local urologist within 1-2 weeks after discharge. - would d/c home on antibiotics for total two week course of treatment. - patient should return to hospital for fever, uncontrolled pain, nausea and/or vomiting, or other concerning signs or symptoms.   Electronic Signatures: Charna Archer (MD)  (Signed 31-Aug-14 13:39)  Authored: Chief Complaint, VITAL SIGNS/ANCILLARY NOTES, Brief Assessment, Lab Results, Assessment/Plan   Last Updated: 31-Aug-14 13:39 by Charna Archer (MD)
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
## 2014-05-18 NOTE — Consult Note (Signed)
Brief Consult Note: Diagnosis: 1) Fever of unknown origin, 2) Right vulvar lesion, possibly abscess.   Patient was seen by consultant.   Consult note dictated.   Recommend to proceed with surgery or procedure.   Recommend further assessment or treatment.   Orders entered.   Discussed with Attending MD.   Comments: right vulva with approximately 7cm (anterior -posterior) x 3.5 (lateral) cm x 3.0cm (deep) firm, somewhat mobile lesion.  Very tender to palpation. Only mildly erythematous on skin overlying. no drainage or obvious starting point.   Plan to order CT scan to assess extent of lesion and further characterize. Highly suspect is source of her fever.  Will likely need to take to OR for incision and drainage.  However, if CT is suggestive of something moer serious may need to transfer to tertiary facility.  If shows small abscess, may be able to treat with antibiotics alone as this patient is not a great surgical candidate and is at high risk for complications (wound is in area where she is incontinent of urine, patient has uncontrolled diabetes, etc.) Discussed with Dr. Bridgette Habermann.  Discussed adding zosyn to vancomycin. Patient does have PCN allergy where she "breaks out."  May consider alternative antibiotic.  Electronic Signatures: Will Bonnet (MD)  (Signed 17-Oct-14 20:29)  Authored: Brief Consult Note   Last Updated: 17-Oct-14 20:29 by Will Bonnet (MD)

## 2014-05-18 NOTE — Consult Note (Signed)
Brief Consult Note: Diagnosis: Occult positive, anemia.   Patient was seen by consultant.   Consult note dictated.   Comments: Patient with brown, hemoccult positive stool by DRE. Mild epigastric and right upper quadrant tenderness with palpation. She denies abd pain or nausea, vomited x 1 with  constipation prior to arrival. Some coughing noted by nursing staff after drinking liquids.Patient and [facility] staff deny dysphagia. Patient is hoarse today and reports "sore throat".  Positive constipation. No melena per [facility] staff.  No hx of colonoscopy or EGD.  PLAN: Increase Protonix IV  to bid. Recommend PRBC 1-2 unit transfusion since she is on pressors and is heme positive. She has risk factors for stress ulcer and there is colon cancer in the family. Give dulcolax suppository today for hx of constipation-no formed stool in the rectal vault on exam. Bedside abd U/S for ruq/epi tenderness. Unremarkable liver panel noted. She will need eventual luminal evaluation when clinically feasible. This case was discussed with Dr. Vira Agar in collaboration of care.  Electronic Signatures: Gershon Mussel (NP)  (Signed 12-Sep-14 14:17)  Authored: Brief Consult Note   Last Updated: 12-Sep-14 14:17 by Gershon Mussel (NP)
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
## 2014-05-18 NOTE — Consult Note (Signed)
Chief Complaint:  Subjective/Chief Complaint " I feel good."   VITAL SIGNS/ANCILLARY NOTES: **Vital Signs.:   15-Sep-14 05:38  Temperature Temperature (F) 98.3  Pulse Pulse 91  Respirations Respirations 20  Systolic BP Systolic BP 115  Diastolic BP (mmHg) Diastolic BP (mmHg) 80  Pulse Ox % Pulse Ox % 96  Oxygen Delivery 2L    09:43  Temperature Temperature (F) 98.3  Pulse Pulse 77  Respirations Respirations 22  Systolic BP Systolic BP 726  Diastolic BP (mmHg) Diastolic BP (mmHg) 86  Pulse Ox % Pulse Ox % 93  Oxygen Delivery 2L    14:15  Vital Signs Type Routine  Temperature Temperature (F) 98.7  Celsius 37  Temperature Source axillary  Pulse Pulse 87  Respirations Respirations 20  Systolic BP Systolic BP 203  Diastolic BP (mmHg) Diastolic BP (mmHg) 82  Mean BP 100  Pulse Ox % Pulse Ox % 91  Pulse Ox Activity Level  At rest  Oxygen Delivery 2L   Brief Assessment:  GEN well developed, disheveled   Cardiac Regular   Respiratory rhonchi  upper airway congestion, cough   Gastrointestinal Normal   Gastrointestinal details normal Soft  Nontender   EXTR negative edema   Radiology Results: XRay:    14-Sep-14 11:47, Chest Portable Single View  Chest Portable Single View   REASON FOR EXAM:    hypoxia  COMMENTS:       PROCEDURE: DXR - DXR PORTABLE CHEST SINGLE VIEW  - Oct 09 2012 11:47AM     RESULT: The lungs are clear. The cardiac silhouette and visualized bony   skeleton are unremarkable.    IMPRESSION:      1. Chest radiograph without evidence of acute cardiopulmonary disease.   2. Comparison dated 10/07/2012        Verified By: Mikki Santee, M.D., MD   Assessment/Plan:  Invasive Device Daily Assessment of Necessity:  Does the patient currently have any of the following indwelling devices? foley   Assessment/Plan:  Assessment Severe IDA with heme pos stool now transfused with Hgb 10. CXR neg yest. Noisy upper respiratory w cough-chronic MBSS  preliminary results: showed  delayed in intial swallowing at the  pharyngeal level, positive aspiration THIN liquids-OK Nectar thickened, bolus stayed in the upper esophagus and gradually passed to question stricture or dysmotility.  Patient has ngt out and is tolerating nectar thickened liq/pureed diet.   Plan EGD tomorrow and colonscopy on Wed if she tolerates the EGD and pending findings.  Continue  IV Protonix Incentive spirometry Nectar thick liquids/pureed diet with close monitoring during feeds per Speech Therapy recommendation   Electronic Signatures: Gershon Mussel (NP)  (Signed 15-Sep-14 15:15)  Authored: Chief Complaint, VITAL SIGNS/ANCILLARY NOTES, Brief Assessment, Radiology Results, Assessment/Plan   Last Updated: 15-Sep-14 15:15 by Gershon Mussel (NP)
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
## 2014-05-18 NOTE — Consult Note (Signed)
CC: anemia.  Question came up today about possible colonoscopy for anemia.  She has family hx of colon cancer.  She would require an NG tube for a colonoscopy to do a prep since she is on a dysphagia diet and Golytely can cause singif pneumonia if aspirated.  Will talk to sister and Hospitalist about possible colonoscopy or not before leaves hospital.    Electronic Signatures: Manya Silvas (MD)  (Signed on 18-Sep-14 17:05)  Authored  Last Updated: 18-Sep-14 17:05 by Manya Silvas (MD)

## 2014-05-18 NOTE — Consult Note (Signed)
Pt CC: dysphagia/  pt eating diet, no complaints of abd pain or trouble swallowing.  Await biopsies. Abd non tender, no masses.  Electronic Signatures: Manya Silvas (MD)  (Signed on 17-Sep-14 16:24)  Authored  Last Updated: 17-Sep-14 16:24 by Manya Silvas (MD)
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
## 2014-05-18 NOTE — Consult Note (Signed)
CC: septic shock.  Pt seen yesterday but note not entered.  Pt had Dubhoff inserted by Hospitalist and was  advanced after x-ray.  She has frequent coughing, likelty secretions pooling in back of throat.  Chest clear in ant fields.  Will get CXR due to cough and to check tube placement.  For barium studies tomorrow.  Electronic Signatures: Manya Silvas (MD)  (Signed on 14-Sep-14 11:37)  Authored  Last Updated: 14-Sep-14 11:37 by Manya Silvas (MD)
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
## 2014-05-18 NOTE — Discharge Summary (Signed)
PATIENT NAME:  [name] [name], TONILYNN BIEKER MR#:  [id number] DATE OF BIRTH:  1960/08/18  DATE OF ADMISSION:  11/09/2012  DATE OF DISCHARGE:  11/14/2012  ADMISSION DIAGNOSES: 1.  Systemic inflammatory response syndrome. 2.  Hypertension.  3.  Diabetes.   DISCHARGE DIAGNOSES:  1.  Sepsis due to labila/perineum cellulitis 2.  History of central diabetes insipidus.  3.  Diabetes. 4.  Depression.  5.  Urinary incontinence.  6.  Dementia.   CONSULTATIONS: Dr. Donneta Romberg from OB/GYN.   DATA:  CT of the pelvis with contrast showed no evidence of abscess. There are inflammatory changes in the right perineum, without abscess.   White blood cell count 13, hemoglobin 9, hematocrit 27, platelets 278.   REASON FOR ADMISSION: A 54 year old female who presented with SIRS/sepsis. For further details, please refer to the H and P.   1.  Sepsis. On arrival, patient has fever, tachycardia. The source is the right sided labial/perineum cellulitis. One out of 2 blood cultures was Coag-negative staph, which is a contaminate. GYN was consulted. Due to her right-sided labial/perineum cellulitis, she had no evidence of abscess as per CT. They recommended antibiotics. She was initially placed on vancomycin and ciprofloxacin. She is allergic to PENICILLIN, so will be discharged with ciprofloxacin. There is low suspicion for MRSA.  2.  She continues to have some mild pain. Her cellulitis has improved. Will continue with pain medications. We recommend elevating that area to help with the swelling.  3.  History of central diabetes insipidus. Continue desmopressin.   4.  Diabetes. The patient will continue outpatient medications.  5.  Depression. The patient is on Prozac.  6.  Dementia. The patient will continue on her outpatient medications of amantadine.   DISCHARGE MEDICATIONS: 1.  Docusate 100 mg b.i.d.  2.  Refresh 1 drop to each affected eye 2 times a day.  3.  Zocor 10 mg at bedtime.  4.  Detrol 4 mg daily.  5.   Januvia 50 mg daily.  6.  Prozac 20 mg daily.  7.  Pletal 50 mg b.i.d.  8.  Diazepam 5 mg p.r.n.  9.  Desmopressin 0.2 mg at bedtime.  10.  Calcium/vitamin D 1 tablet b.i.d.  11.  Aricept 10 mg b.i.d.  12.  Cardizem 240 mg daily.  13.  Prilosec 20 mg daily. 14.  Fosamax 70 mg weekly.  15.  Ciprofloxacin 500 mg q. 12 hours x 10 days.  16.  Acetaminophen/codeine 1 tablet q. 6 hours p.r.n. pain, #30.  17.  Promethazine 25 mg 1/2 tablet to 1 q. 6 hours p.r.n.  18.  Nortriptyline 25 mg at bedtime.   DISCHARGE DIET:  Carbohydrate, ADA pureed, honey-thickened liquids, dysphagia 1 diet.   DISCHARGE ACTIVITY: As tolerated.   DISCHARGE FOLLOW UP:  The patient will follow up in one week with Dr. Rutherford Nail. The patient is medically stable for discharge.   TIME SPENT: 35 minutes.    ____________________________ Donell Beers. Benjie Karvonen, MD spm:mr D: 11/14/2012 16:40:32 ET T: 11/14/2012 20:04:08 ET JOB#: [id number]  cc: Chaselyn Nanney P. Benjie Karvonen, MD, <Dictator> Donell Beers Larnie Heart MD ELECTRONICALLY SIGNED 11/14/2012 20:59
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
## 2014-05-19 NOTE — Consult Note (Signed)
Pt seen and examined. Full consult to follow. Pt with MR. Not able to provide any hx. Pt had coffee ground emesis at home. Hgb stable so far. Has pneumonia, possibly aspiration-related. Previous colonoscopy done on 3/15 was normal except for hemorrhoids. EGD done on 9/14 showed reflux esophagitis, hiatal hernia, and bx c/w Barrett's esophagus. No mention of antral/duodenal ulcers or pyloric stenosis. CT shows dilated stomach esophagus and contrast in distal esophagus. Need to treat pneumonia 1st. No urgency in EGD at this time though will prob schedule on Monday. However, would check KUB daily to see if stomach decreasing in size. If not, may need to place NG for suction prior to any EGD. Continue daily PPI.  Electronic Signatures: Verdie Shire (MD)  (Signed on 16-May-15 09:37)  Authored  Last Updated: 16-May-15 09:37 by Verdie Shire (MD)

## 2014-05-19 NOTE — Consult Note (Signed)
EGD showed evidence of reflux esophagitis with probable Barrett's. No active bleeding. Regular diet. Daily PPI. Will sign off. Discharge when penumonia under control. Thanks  Electronic Signatures: Verdie Shire (MD)  (Signed on 18-May-15 12:40)  Authored  Last Updated: 18-May-15 12:40 by Verdie Shire (MD)

## 2014-05-19 NOTE — Discharge Summary (Signed)
PATIENT NAME:  [name] [name], [name] [name] MR#:  [id number] DATE OF BIRTH:  [birth year]  DATE OF ADMISSION:  06/09/2013 DATE OF DISCHARGE:  06/13/2013  ADMISSION DIAGNOSES:  1.  Sepsis from pneumonia, likely aspiration.  2.  Aspiration pneumonia.  3.  Gastrointestinal bleed with reported coffee-ground emesis.   DISCHARGE DIAGNOSES:  1.  Sepsis from aspiration pneumonia.  2.  Aspiration pneumonia. 3.  Gastrointestinal bleed with reported coffee-ground emesis and melena with acute blood loss anemia, chronic.  4.  Diabetes.  5.  Hypokalemia.   CONSULTATIONS: Dr. Candace Cruise.  PROCEDURES: The patient underwent EGD on Jun 12, 2013 which showed Barrett esophagus, normal stomach and normal examined duodenum.   BLOOD TRANSFUSIONS: Two.  DISCHARGE LABORATORIES: White blood cells 6.8, hemoglobin 8.7, hematocrit 27, platelets are 164,000.   HOSPITAL COURSE: This is a 54 year old female who presented with coffee-ground emesis and pneumonia. For further details, please refer to the H and P.  1.  Sepsis. The patient presented with sepsis from aspiration pneumonia, as outlined below. 2.  Aspiration pneumonia. The patient was placed on aspiration precautions. Speech consultation was obtained. She was on Zosyn and Levaquin. Blood cultures are negative to date. She is doing better. She is not requiring any oxygen.  4.  GI bleed with reported coffee-ground emesis on admission and possible melena. Her hemoglobin did drop and she was transfused 2 units of PRBCs. Her EGD showed no acute blood loss. She did have some Barrett esophagus. She will continue on PPI.  5.  Diabetes. The patient will resume her home medications.   DISCHARGE MEDICATIONS: 1.  Refresh ophthalmic daily.  2.  Detrol 4 mg daily.  3.  Diazepam 5 mg daily as needed.  4.   0.2 mg at bedtime.  5.  Nortriptyline 25 mg at bedtime.  6.  Promethazine 25 mg 1/2 tablet to 1 tablet q. 6 hours p.r.n.  7.  Latuda 20 mg daily.  8.  Ramipril 5 mg daily. 9.  Januvia 50  mg daily. 10.  Fluoxetine 20 mg daily.  11.  Omeprazole 20 mg daily.  12.  Calcium plus D 600 mg b.i.d.  13.  Namenda 5 mg b.i.d.  14.  Donepezil 10 mg b.i.d.  15.  Docusate 100 mg daily.  16.  Cilostazol 50 mg b.i.d.  17.  Alendronate 70 mg on Sunday.  18.  Simvastatin 10 mg daily.  19.  Preparation H b.i.d. p.r.n.  20.  Pantoprazole 40 mg daily.  21.  Clindamycin 300 mg q. 8 hours for 10 days.  22.  Levaquin 500 mg q. 24 hours x8 days.   DISCHARGE DIET: The patient is on a mechanical soft, low sodium, ADA diet. Aspiration precautions.   DISCHARGE ACTIVITY: As tolerated with home health  DISCHARGE FOLLOWUP: The patient can follow up with Dr. Rutherford Nail in 1 week and Dr Candace Cruise 2 weeks.   TIME SPENT: 35 minutes.   ____________________________ Donell Beers. Benjie Karvonen, MD spm:sb D: 06/13/2013 10:04:02 ET T: 06/13/2013 10:22:42 ET JOB#: [id number]  cc: Brenda Cowher P. Benjie Karvonen, MD, <Dictator> Donell Beers Kya Mayfield MD ELECTRONICALLY SIGNED 06/13/2013 14:33

## 2014-05-19 NOTE — Consult Note (Signed)
Chief Complaint:  Subjective/Chief Complaint No complaints. Drop in hgb to 6. To be given blood trnasfusion today. No coughing either. X-ray shows improvement.   VITAL SIGNS/ANCILLARY NOTES: **Vital Signs.:   17-May-15 08:17  Vital Signs Type Pre Medication  Temperature Temperature (F) 98.9  Celsius 37.1  Temperature Source oral  Pulse Pulse 113  Respirations Respirations 20  Systolic BP Systolic BP 96  Diastolic BP (mmHg) Diastolic BP (mmHg) 69  Mean BP 78  Pulse Ox % Pulse Ox % 93  Pulse Ox Activity Level  At rest  Oxygen Delivery Room Air/ 21 %   Brief Assessment:  GEN well nourished   Cardiac Regular   Respiratory normal resp effort   Gastrointestinal Normal   Lab Results: Routine BB:  17-May-15 09:55   ABO Group + Rh Type O Positive  Antibody Screen NEGATIVE (Result(s) reported on 11 Jun 2013 at 11:00AM.)  Crossmatch Unit 1 Ready  Crossmatch Unit 2 Ready  Crossmatch Unit 3 Ready (Result(s) reported on 11 Jun 2013 at 11:02AM.)   Assessment/Plan:  Assessment/Plan:  Assessment UGI bleed with drop in hgb   Plan To receive blood transfusion today. Will get consent from family regarding EGD. Will plan sometime tomorrow. Thanks.   Electronic Signatures: Verdie Shire (MD)  (Signed 17-May-15 11:08)  Authored: Chief Complaint, VITAL SIGNS/ANCILLARY NOTES, Brief Assessment, Lab Results, Assessment/Plan   Last Updated: 17-May-15 11:08 by Verdie Shire (MD)

## 2014-05-23 DIAGNOSIS — M6281 Muscle weakness (generalized): Secondary | ICD-10-CM | POA: Diagnosis not present

## 2014-05-23 DIAGNOSIS — I69152 Hemiplegia and hemiparesis following nontraumatic intracerebral hemorrhage affecting left dominant side: Secondary | ICD-10-CM | POA: Diagnosis not present

## 2014-05-24 DIAGNOSIS — E11319 Type 2 diabetes mellitus with unspecified diabetic retinopathy without macular edema: Secondary | ICD-10-CM | POA: Diagnosis not present

## 2014-05-29 DIAGNOSIS — F329 Major depressive disorder, single episode, unspecified: Secondary | ICD-10-CM | POA: Diagnosis not present

## 2014-06-01 DIAGNOSIS — I69152 Hemiplegia and hemiparesis following nontraumatic intracerebral hemorrhage affecting left dominant side: Secondary | ICD-10-CM | POA: Diagnosis not present

## 2014-06-01 DIAGNOSIS — M6281 Muscle weakness (generalized): Secondary | ICD-10-CM | POA: Diagnosis not present

## 2014-06-08 DIAGNOSIS — I69152 Hemiplegia and hemiparesis following nontraumatic intracerebral hemorrhage affecting left dominant side: Secondary | ICD-10-CM | POA: Diagnosis not present

## 2014-06-08 DIAGNOSIS — M6281 Muscle weakness (generalized): Secondary | ICD-10-CM | POA: Diagnosis not present

## 2014-06-12 DIAGNOSIS — I739 Peripheral vascular disease, unspecified: Secondary | ICD-10-CM | POA: Diagnosis not present

## 2014-06-12 DIAGNOSIS — E780 Pure hypercholesterolemia: Secondary | ICD-10-CM | POA: Diagnosis not present

## 2014-06-12 DIAGNOSIS — R8290 Unspecified abnormal findings in urine: Secondary | ICD-10-CM | POA: Diagnosis not present

## 2014-06-12 DIAGNOSIS — I10 Essential (primary) hypertension: Secondary | ICD-10-CM | POA: Diagnosis not present

## 2014-06-12 DIAGNOSIS — F09 Unspecified mental disorder due to known physiological condition: Secondary | ICD-10-CM | POA: Diagnosis not present

## 2014-06-12 DIAGNOSIS — E1159 Type 2 diabetes mellitus with other circulatory complications: Secondary | ICD-10-CM | POA: Diagnosis not present

## 2014-06-13 DIAGNOSIS — M6281 Muscle weakness (generalized): Secondary | ICD-10-CM | POA: Diagnosis not present

## 2014-06-13 DIAGNOSIS — I69152 Hemiplegia and hemiparesis following nontraumatic intracerebral hemorrhage affecting left dominant side: Secondary | ICD-10-CM | POA: Diagnosis not present

## 2014-06-15 DIAGNOSIS — R05 Cough: Secondary | ICD-10-CM | POA: Diagnosis not present

## 2014-06-15 DIAGNOSIS — R399 Unspecified symptoms and signs involving the genitourinary system: Secondary | ICD-10-CM | POA: Diagnosis not present

## 2014-06-20 DIAGNOSIS — R1310 Dysphagia, unspecified: Secondary | ICD-10-CM | POA: Diagnosis not present

## 2014-06-20 DIAGNOSIS — G4733 Obstructive sleep apnea (adult) (pediatric): Secondary | ICD-10-CM | POA: Diagnosis not present

## 2014-06-20 DIAGNOSIS — R0989 Other specified symptoms and signs involving the circulatory and respiratory systems: Secondary | ICD-10-CM | POA: Diagnosis not present

## 2014-06-20 DIAGNOSIS — T17890A Other foreign object in other parts of respiratory tract causing asphyxiation, initial encounter: Secondary | ICD-10-CM | POA: Diagnosis not present

## 2014-06-20 DIAGNOSIS — F09 Unspecified mental disorder due to known physiological condition: Secondary | ICD-10-CM | POA: Diagnosis not present

## 2014-06-22 ENCOUNTER — Other Ambulatory Visit: Payer: Self-pay | Admitting: Family Medicine

## 2014-06-22 DIAGNOSIS — I69152 Hemiplegia and hemiparesis following nontraumatic intracerebral hemorrhage affecting left dominant side: Secondary | ICD-10-CM | POA: Diagnosis not present

## 2014-06-22 DIAGNOSIS — T17920A Food in respiratory tract, part unspecified causing asphyxiation, initial encounter: Secondary | ICD-10-CM

## 2014-06-22 DIAGNOSIS — R1310 Dysphagia, unspecified: Secondary | ICD-10-CM

## 2014-06-22 DIAGNOSIS — T17928A Food in respiratory tract, part unspecified causing other injury, initial encounter: Secondary | ICD-10-CM

## 2014-06-22 DIAGNOSIS — M6281 Muscle weakness (generalized): Secondary | ICD-10-CM | POA: Diagnosis not present

## 2014-07-03 ENCOUNTER — Ambulatory Visit: Payer: Medicare Other

## 2014-07-04 DIAGNOSIS — M6281 Muscle weakness (generalized): Secondary | ICD-10-CM | POA: Diagnosis not present

## 2014-07-04 DIAGNOSIS — I69152 Hemiplegia and hemiparesis following nontraumatic intracerebral hemorrhage affecting left dominant side: Secondary | ICD-10-CM | POA: Diagnosis not present

## 2014-07-05 ENCOUNTER — Other Ambulatory Visit: Payer: Self-pay

## 2014-07-05 MED ORDER — VITAMIN D (CHOLECALCIFEROL) 10 MCG (400 UNIT) PO TABS: 1.0000 | ORAL_TABLET | Freq: Every day | ORAL | Status: DC

## 2014-07-09 DIAGNOSIS — G4733 Obstructive sleep apnea (adult) (pediatric): Secondary | ICD-10-CM | POA: Diagnosis not present

## 2014-07-11 DIAGNOSIS — I69152 Hemiplegia and hemiparesis following nontraumatic intracerebral hemorrhage affecting left dominant side: Secondary | ICD-10-CM | POA: Diagnosis not present

## 2014-07-11 DIAGNOSIS — M6281 Muscle weakness (generalized): Secondary | ICD-10-CM | POA: Diagnosis not present

## 2014-07-12 ENCOUNTER — Ambulatory Visit
Admission: RE | Admit: 2014-07-12 | Discharge: 2014-07-12 | Disposition: A | Discharge status: Home / Self Care | Payer: Medicare Other | Source: Ambulatory Visit | Attending: Family Medicine | Admitting: Family Medicine

## 2014-07-12 DIAGNOSIS — Z880 Allergy status to penicillin: Secondary | ICD-10-CM | POA: Diagnosis not present

## 2014-07-12 DIAGNOSIS — E119 Type 2 diabetes mellitus without complications: Secondary | ICD-10-CM | POA: Insufficient documentation

## 2014-07-12 DIAGNOSIS — R0901 Asphyxia: Secondary | ICD-10-CM | POA: Diagnosis not present

## 2014-07-12 DIAGNOSIS — Z888 Allergy status to other drugs, medicaments and biological substances status: Secondary | ICD-10-CM | POA: Diagnosis not present

## 2014-07-12 DIAGNOSIS — T17920A Food in respiratory tract, part unspecified causing asphyxiation, initial encounter: Secondary | ICD-10-CM | POA: Insufficient documentation

## 2014-07-12 DIAGNOSIS — I10 Essential (primary) hypertension: Secondary | ICD-10-CM | POA: Insufficient documentation

## 2014-07-12 DIAGNOSIS — R1310 Dysphagia, unspecified: Secondary | ICD-10-CM
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
## 2014-07-13 ENCOUNTER — Other Ambulatory Visit: Payer: Self-pay | Admitting: Family Medicine

## 2014-07-18 ENCOUNTER — Other Ambulatory Visit: Payer: Self-pay | Admitting: Family Medicine

## 2014-07-18 ENCOUNTER — Telehealth: Payer: Self-pay | Admitting: Family Medicine

## 2014-07-18 DIAGNOSIS — M6281 Muscle weakness (generalized): Secondary | ICD-10-CM | POA: Diagnosis not present

## 2014-07-18 DIAGNOSIS — I69152 Hemiplegia and hemiparesis following nontraumatic intracerebral hemorrhage affecting left dominant side: Secondary | ICD-10-CM | POA: Diagnosis not present

## 2014-07-18 NOTE — Telephone Encounter (Signed)
SHE HAS QUESTIONS ABOUT SLEEP STUDY, AND ALL OTHER ISSUE INCLUDING MEDS. [name] [name]

## 2014-07-19 ENCOUNTER — Ambulatory Visit: Payer: Medicare Other

## 2014-07-19 NOTE — Telephone Encounter (Signed)
Done by Hollie Salk this morning

## 2014-07-19 NOTE — Telephone Encounter (Signed)
Rowland Lathe called from [facility] and stated that PT came out to [facility] on 07/19/2014. PT stated that she need a Hospital bed and a roll shower chair to keep her from falling out of bed and shower. Would like order sent to [facility].. Patient had swallowing study at [facility] and they recommended that a aid is needed to feed [place] each day. Need a script sent to [facility] for sleep study that is scheduled for Monday. Patient did not sleep enough the first time for study. Dr. Arlyn Dunning has been notified of conversation. A 1 dose script for Zolpidem to take before sleep study has been called in and faxed a hard copy. A hard copy for medical appliances has been sent to [facility]. Becky notified.

## 2014-07-23 ENCOUNTER — Other Ambulatory Visit: Payer: Self-pay | Admitting: Family Medicine

## 2014-07-23 DIAGNOSIS — G4730 Sleep apnea, unspecified: Secondary | ICD-10-CM | POA: Diagnosis not present

## 2014-07-25 ENCOUNTER — Telehealth: Payer: Self-pay | Admitting: Family Medicine

## 2014-07-25 DIAGNOSIS — I69152 Hemiplegia and hemiparesis following nontraumatic intracerebral hemorrhage affecting left dominant side: Secondary | ICD-10-CM | POA: Diagnosis not present

## 2014-07-25 DIAGNOSIS — M6281 Muscle weakness (generalized): Secondary | ICD-10-CM | POA: Diagnosis not present

## 2014-07-25 NOTE — Telephone Encounter (Signed)
[name] [name] is requesting a return call. Would like to discuss the orders for a shower seat that rolls, a hospital bed, a written assessment for swollow study. And she states that the sleep study people are suppose to be faxing the sleep study results. Please return her call [phone]

## 2014-07-26 NOTE — Telephone Encounter (Signed)
Spoke to [place]. From [facility].  Need a copy of swallowing study and note stating that you agree with them feeding Suanne Marker. Need to fax order over for Hospital Bed and shower seat sent to Nanette at [facility].

## 2014-08-01 NOTE — Telephone Encounter (Signed)
Becky picked up the order on 07-31-14

## 2014-08-03 DIAGNOSIS — I69152 Hemiplegia and hemiparesis following nontraumatic intracerebral hemorrhage affecting left dominant side: Secondary | ICD-10-CM | POA: Diagnosis not present

## 2014-08-03 DIAGNOSIS — M6281 Muscle weakness (generalized): Secondary | ICD-10-CM | POA: Diagnosis not present

## 2014-08-10 DIAGNOSIS — I69152 Hemiplegia and hemiparesis following nontraumatic intracerebral hemorrhage affecting left dominant side: Secondary | ICD-10-CM | POA: Diagnosis not present

## 2014-08-10 DIAGNOSIS — M6281 Muscle weakness (generalized): Secondary | ICD-10-CM | POA: Diagnosis not present

## 2014-08-13 DIAGNOSIS — I639 Cerebral infarction, unspecified: Secondary | ICD-10-CM | POA: Diagnosis not present

## 2014-08-13 DIAGNOSIS — I890 Lymphedema, not elsewhere classified: Secondary | ICD-10-CM | POA: Diagnosis not present

## 2014-08-13 DIAGNOSIS — M7989 Other specified soft tissue disorders: Secondary | ICD-10-CM | POA: Diagnosis not present

## 2014-08-13 DIAGNOSIS — I872 Venous insufficiency (chronic) (peripheral): Secondary | ICD-10-CM | POA: Diagnosis not present

## 2014-08-13 DIAGNOSIS — I739 Peripheral vascular disease, unspecified: Secondary | ICD-10-CM | POA: Diagnosis not present

## 2014-08-14 ENCOUNTER — Encounter: Payer: Self-pay | Admitting: Emergency Medicine

## 2014-08-14 ENCOUNTER — Emergency Department
Admission: EM | Admit: 2014-08-14 | Discharge: 2014-08-14 | Disposition: A | Discharge status: Home / Self Care | Payer: Medicare Other | Attending: Emergency Medicine | Admitting: Emergency Medicine

## 2014-08-14 ENCOUNTER — Emergency Department: Payer: Medicare Other

## 2014-08-14 DIAGNOSIS — R05 Cough: Secondary | ICD-10-CM | POA: Insufficient documentation

## 2014-08-14 DIAGNOSIS — I10 Essential (primary) hypertension: Secondary | ICD-10-CM | POA: Insufficient documentation

## 2014-08-14 DIAGNOSIS — R079 Chest pain, unspecified: Secondary | ICD-10-CM

## 2014-08-14 DIAGNOSIS — E119 Type 2 diabetes mellitus without complications: Secondary | ICD-10-CM | POA: Diagnosis not present

## 2014-08-14 DIAGNOSIS — Z880 Allergy status to penicillin: Secondary | ICD-10-CM | POA: Insufficient documentation

## 2014-08-14 DIAGNOSIS — Z87891 Personal history of nicotine dependence: Secondary | ICD-10-CM | POA: Diagnosis not present

## 2014-08-14 DIAGNOSIS — R0789 Other chest pain: Secondary | ICD-10-CM | POA: Diagnosis not present

## 2014-08-14 HISTORY — DX: Unspecified intracranial injury with loss of consciousness of unspecified duration, initial encounter: S06.9X9A

## 2014-08-14 HISTORY — DX: Quadriplegia, unspecified: G82.50

## 2014-08-14 HISTORY — DX: Diplopia: H53.2

## 2014-08-14 HISTORY — DX: Essential (primary) hypertension: I10

## 2014-08-14 HISTORY — DX: Age-related osteoporosis without current pathological fracture: M81.0

## 2014-08-14 HISTORY — DX: Unspecified osteoarthritis, unspecified site: M19.90

## 2014-08-14 HISTORY — DX: Unspecified intracranial injury with loss of consciousness status unknown, initial encounter: S06.9XAA

## 2014-08-14 HISTORY — DX: Borderline intellectual functioning: R41.83

## 2014-08-14 HISTORY — DX: Type 2 diabetes mellitus without complications: E11.9

## 2014-08-14 HISTORY — DX: Unspecified speech disturbances: R47.9

## 2014-08-14 LAB — COMPREHENSIVE METABOLIC PANEL
ALK PHOS: 109 U/L (ref 38–126)
ALT: 21 U/L (ref 14–54)
AST: 22 U/L (ref 15–41)
Albumin: 3.8 g/dL (ref 3.5–5.0)
Anion gap: 9 (ref 5–15)
BILIRUBIN TOTAL: 0.2 mg/dL — AB (ref 0.3–1.2)
BUN: 10 mg/dL (ref 6–20)
CHLORIDE: 100 mmol/L — AB (ref 101–111)
CO2: 27 mmol/L (ref 22–32)
Calcium: 9.2 mg/dL (ref 8.9–10.3)
Creatinine, Ser: 1.06 mg/dL — ABNORMAL HIGH (ref 0.44–1.00)
GFR calc non Af Amer: 58 mL/min — ABNORMAL LOW (ref >60)
Glucose, Bld: 192 mg/dL — ABNORMAL HIGH (ref 65–99)
Potassium: 4.5 mmol/L (ref 3.5–5.1)
Sodium: 136 mmol/L (ref 135–145)
Total Protein: 7.4 g/dL (ref 6.5–8.1)

## 2014-08-14 LAB — CBC
HCT: 43.6 % (ref 35.0–47.0)
Hemoglobin: 14.1 g/dL (ref 12.0–16.0)
MCH: 29.1 pg (ref 26.0–34.0)
MCHC: 32.4 g/dL (ref 32.0–36.0)
MCV: 89.9 fL (ref 80.0–100.0)
Platelets: 196 10*3/uL (ref 150–440)
RBC: 4.85 MIL/uL (ref 3.80–5.20)
RDW: 12.9 % (ref 11.5–14.5)
WBC: 5.9 10*3/uL (ref 3.6–11.0)

## 2014-08-14 LAB — TROPONIN I: Troponin I: <0.03 ng/mL (ref <0.031)

## 2014-08-14 NOTE — ED Provider Notes (Addendum)
[facility] Emergency Department Provider Note     Time seen: ----------------------------------------- 12:57 PM on 08/14/2014 -----------------------------------------    I have reviewed the triage vital signs and the nursing notes.   HISTORY  Chief Complaint Chest Pain    HPI [name] [name] is a 54 y.o. female brought from a group home for sharp chest pain that began about 10 AM this morning. Pain is central to the midchest denies any diaphoresis nausea or shortness of breath. She has had a cough that has been noted by caregiver. This is not a common symptom for her. Denies fevers chills or other complaints.   Past Medical History  Diagnosis Date  . Traumatic brain injury   . Hypertension   . Diabetes mellitus without complication   . Osteoporosis   . Arthritis   . Spastic quadriparesis   . Borderline intellectual functioning   . Double vision   . 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   History reviewed. No pertinent past surgical history.  Allergies Dilantin; Penicillins; and Sulfa antibiotics  Social History History  Substance Use Topics  . Smoking status: Former Research scientist (life sciences)  . Smokeless tobacco: Not on file  . Alcohol Use: No    Review of Systems Constitutional: Negative for fever. Eyes: Negative for visual changes. ENT: Negative for sore throat. Cardiovascular: Positive for chest pain Respiratory: Negative for shortness of breath. Positive for cough Gastrointestinal: Negative for abdominal pain, vomiting and diarrhea. Genitourinary: Negative for dysuria. Musculoskeletal: Negative for back pain. Skin: Negative for rash. Neurological: Negative for headaches, focal weakness or numbness.  10-point ROS otherwise negative.  ____________________________________________   PHYSICAL EXAM:  VITAL SIGNS: ED Triage Vitals  Enc Vitals Group     BP 08/14/14 1251 114/85 mmHg   Pulse Rate 08/14/14 1251 102     Resp 08/14/14 1251 20     Temp 08/14/14 1251 98.5 F (36.9 C)     Temp Source 08/14/14 1251 Oral     SpO2 08/14/14 1251 97 %     Weight 08/14/14 1251 180 lb (81.647 kg)     Height 08/14/14 1251 5\' 7"  (1.702 m)     Head Cir --      Peak Flow --      Pain Score 08/14/14 1247 1     Pain Loc --      Pain Edu? --      Excl. in [place]? --     Constitutional: Alert and oriented. Well appearing and in no distress. Eyes: Conjunctivae are normal. PERRL. Normal extraocular movements. ENT   Head: Normocephalic and atraumatic.   Nose: No congestion/rhinnorhea.   Mouth/Throat: Mucous membranes are moist.   Neck: No stridor. Cardiovascular: Normal rate, regular rhythm. Normal and symmetric distal pulses are present in all extremities. No murmurs, rubs, or gallops. Respiratory: Normal respiratory effort without tachypnea nor retractions. Breath sounds are clear and equal bilaterally. No wheezes/rales/rhonchi. Gastrointestinal: Soft and nontender. No distention. No abdominal bruits. There is no CVA tenderness. Musculoskeletal: Nontender with normal range of motion in all extremities. No joint effusions.  No lower extremity tenderness nor edema. Mild chest wall tenderness Neurologic:  Normal speech and language. No gross focal neurologic deficits are appreciated. Speech is normal. No gait instability. Skin:  Skin is warm, dry and intact. No rash noted. Psychiatric: Mood and affect are normal. Speech and behavior are normal. Patient exhibits appropriate insight and judgment. ____________________________________________  EKG: Interpreted  by me. Normal sinus rhythm with a rate of 99 bpm, normal axis normal intervals. No evidence of hypertrophy or acute infarction.  ____________________________________________  ED COURSE:  Pertinent labs & imaging results that were available during my care of the patient were reviewed by me and considered in my medical  decision making (see chart for details).  ____________________________________________    LABS (pertinent positives/negatives)  Labs Reviewed  COMPREHENSIVE METABOLIC PANEL - Abnormal; Notable for the following:    Chloride 100 (*)    Glucose, Bld 192 (*)    Creatinine, Ser 1.06 (*)    Total Bilirubin 0.2 (*)    GFR calc non Af Amer 58 (*)    All other components within normal limits  CBC  TROPONIN I    RADIOLOGY  Chest x-ray FINDINGS: Heart size and vascular pattern normal. No consolidation effusion or pneumothorax. Bony thorax intact. Mild scoliosis. Moderately severe bilateral shoulder arthritis.  IMPRESSION: No active cardiopulmonary disease. ____________________________________________  FINAL ASSESSMENT AND PLAN  Chest pain, cough  Plan: Patient with labs and imaging as dictated above. Workup here is been unremarkable, she is stable for outpatient follow-up with her doctor.   Earleen Newport, MD   Earleen Newport, MD 08/14/14 [place], MD 08/14/14 [phone]

## 2014-08-14 NOTE — ED Notes (Signed)
Patient to ER for c/o sharp chest pain that began at approx 1000 this am. States pain is to left side of chest. Denies diaphoresis, nausea, or shortness of breath.

## 2014-08-14 NOTE — Discharge Instructions (Signed)

## 2014-08-15 ENCOUNTER — Other Ambulatory Visit: Payer: Self-pay | Admitting: Emergency Medicine

## 2014-08-15 MED ORDER — ALENDRONATE SODIUM 70 MG PO TABS: 70.0000 mg | ORAL_TABLET | ORAL | Status: DC

## 2014-08-21 ENCOUNTER — Ambulatory Visit: Payer: Self-pay | Admitting: Family Medicine

## 2014-08-23 ENCOUNTER — Encounter: Payer: Self-pay | Admitting: Family Medicine

## 2014-08-23 ENCOUNTER — Ambulatory Visit (INDEPENDENT_AMBULATORY_CARE_PROVIDER_SITE_OTHER): Payer: Medicare Other | Admitting: Family Medicine

## 2014-08-23 VITALS — BP 124/78 | HR 104 | Temp 97.7°F | Resp 16

## 2014-08-23 DIAGNOSIS — S069X0S Unspecified intracranial injury without loss of consciousness, sequela: Secondary | ICD-10-CM

## 2014-08-23 DIAGNOSIS — E1151 Type 2 diabetes mellitus with diabetic peripheral angiopathy without gangrene: Secondary | ICD-10-CM

## 2014-08-23 DIAGNOSIS — E860 Dehydration: Secondary | ICD-10-CM

## 2014-08-23 DIAGNOSIS — E785 Hyperlipidemia, unspecified: Secondary | ICD-10-CM | POA: Insufficient documentation

## 2014-08-23 DIAGNOSIS — R4189 Other symptoms and signs involving cognitive functions and awareness: Secondary | ICD-10-CM | POA: Diagnosis not present

## 2014-08-23 DIAGNOSIS — M810 Age-related osteoporosis without current pathological fracture: Secondary | ICD-10-CM | POA: Insufficient documentation

## 2014-08-23 DIAGNOSIS — R079 Chest pain, unspecified: Secondary | ICD-10-CM | POA: Diagnosis not present

## 2014-08-23 DIAGNOSIS — F339 Major depressive disorder, recurrent, unspecified: Secondary | ICD-10-CM | POA: Insufficient documentation

## 2014-08-23 DIAGNOSIS — E119 Type 2 diabetes mellitus without complications: Secondary | ICD-10-CM | POA: Insufficient documentation

## 2014-08-23 DIAGNOSIS — K5909 Other constipation: Secondary | ICD-10-CM | POA: Insufficient documentation

## 2014-08-23 NOTE — Progress Notes (Signed)
Name: [name] [name]   MRN: [id number]    DOB: 1960/06/25   Date:08/23/2014       Progress Note  Subjective  Chief Complaint  Chief Complaint  Patient presents with  . Hospitalization Follow-up    ER: chest pain and was diagnosised with dehydration needs order for free water protocol    Chest Pain  This is a new problem. The current episode started in the past 7 days. The onset quality is sudden. The problem occurs rarely. The problem has been resolved. The pain is present in the substernal region. The pain is mild. The quality of the pain is described as heavy. The pain does not radiate. Pertinent negatives include no back pain, claudication, cough, dizziness, fever, headaches, hemoptysis, nausea, orthopnea, palpitations, shortness of breath, vomiting or weakness. She has tried nothing for the symptoms.  Her past medical history is significant for diabetes.  Pertinent negatives for past medical history include no seizures. Prior diagnostic workup includes chest x-ray (ER visit with EKG and cardiac enzymes which were unremarkable).     Past Medical History  Diagnosis Date  . Traumatic brain injury   . Hypertension   . Diabetes mellitus without complication   . Osteoporosis   . Arthritis   . Spastic quadriparesis   . Borderline intellectual functioning   . Double vision   . 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     History  Substance Use Topics  . Smoking status: Former Research scientist (life sciences)  . Smokeless tobacco: Never Used  . Alcohol Use: No     Current outpatient prescriptions:  .  alendronate (FOSAMAX) 70 MG tablet, 1 TAB PO WEEKLY EARLY AM BEFORE FOOD/MEDS W/WATER. DON'T LIE DOWN FOR30 MIN (OSTEOPOROSIS) *NO CRUSH*, Disp: 4 tablet, Rfl: 0 .  ALPRAZolam (XANAX) 0.5 MG tablet, Take 0.5 mg by mouth 2 (two) times daily as needed (agitation)., Disp: , Rfl:  .  carboxymethylcellulose (REFRESH PLUS) 0.5 % SOLN, Place 1 drop into both eyes 3 (three) times daily as needed (dry eyes)., Disp: , Rfl:  .   desmopressin (DDAVP) 0.2 MG tablet, Take 0.2 mg by mouth at bedtime., Disp: , Rfl:  .  ferrous sulfate 325 (65 FE) MG tablet, Take 325 mg by mouth daily., Disp: , Rfl:  .  FLUoxetine (PROZAC) 10 MG tablet, Take 30 mg by mouth every morning., Disp: , Rfl:  .  Lurasidone HCl 20 MG TABS, Take 1 tablet by mouth every morning., Disp: , Rfl:  .  memantine (NAMENDA XR) 28 MG CP24 24 hr capsule, Take 28 mg by mouth daily., Disp: , Rfl:  .  nortriptyline (PAMELOR) 25 MG capsule, Take 25 mg by mouth at bedtime., Disp: , Rfl:  .  nystatin cream (MYCOSTATIN), APPLY TO DIAPER RASH FOUR TIMES A DAY, Disp: 120 g, Rfl: 0 .  omeprazole (PRILOSEC) 20 MG capsule, Take 20 mg by mouth daily., Disp: , Rfl:  .  promethazine (PHENERGAN) 25 MG tablet, Take 12.5-25 mg by mouth every 6 (six) hours as needed for nausea., Disp: , Rfl:  .  ramipril (ALTACE) 5 MG capsule, Take 5 mg by mouth daily., Disp: , Rfl:  .  simvastatin (ZOCOR) 10 MG tablet, Take 10 mg by mouth at bedtime., Disp: , Rfl:  .  sitaGLIPtin (JANUVIA) 50 MG tablet, Take 50 mg by mouth daily., Disp: , Rfl:  .  tolterodine (DETROL LA) 4 MG 24 hr capsule, Take 4 mg by mouth at bedtime., Disp: , Rfl:   Allergies  Allergen Reactions  .  Dilantin [Phenytoin] Other (See Comments)    unknown  . Penicillins Other (See Comments)    unknown  . Sulfa Antibiotics Rash    Review of Systems  Constitutional: Negative for fever, chills and weight loss.  HENT: Negative for congestion, hearing loss, sore throat and tinnitus.   Eyes: Negative for blurred vision, double vision and redness.  Respiratory: Negative for cough, hemoptysis and shortness of breath.   Cardiovascular: Positive for chest pain (NOW RESOLVED). Negative for palpitations, orthopnea, claudication and leg swelling.  Gastrointestinal: Negative for heartburn, nausea, vomiting, diarrhea, constipation and blood in stool.  Genitourinary: Negative for dysuria, urgency, frequency and hematuria.   Musculoskeletal: Negative for myalgias, back pain, joint pain, falls and neck pain.  Skin: Negative for itching.  Neurological: Negative for dizziness, tingling, tremors, focal weakness, seizures, loss of consciousness, weakness and headaches.       " Spastic quadriparesis  Endo/Heme/Allergies: Does not bruise/bleed easily.  Psychiatric/Behavioral: Negative for depression and substance abuse. The patient is not nervous/anxious and does not have insomnia.         cognitive impairment     Objective  Filed Vitals:   08/23/14 1048  BP: 124/78  Pulse: 104  Temp: 97.7 F (36.5 C)  TempSrc: Oral  Resp: 16  SpO2: 97%     Physical Exam  Constitutional: She is oriented to person, place, and time.  Wheelchair-bound  HENT:  Head: Normocephalic.  Eyes: EOM are normal. Pupils are equal, round, and reactive to light.  Neck: Normal range of motion. No thyromegaly present.  Cardiovascular: Normal rate, regular rhythm and normal heart sounds.   No murmur heard. Diminished peripheral pulses within shiny skin  Pulmonary/Chest: Effort normal and breath sounds normal.  Abdominal: Soft. Bowel sounds are normal.  Musculoskeletal: Normal range of motion. She exhibits no edema.  Neurological: She is alert and oriented to person, place, and time. No cranial nerve deficit. Gait normal.  Moderate cognitive impairment with spastic quadriparesis   Skin: Skin is warm and dry. No rash noted.  Psychiatric:  Moderate cognitive impairment      Assessment & Plan   1. Dehydration Resolved  2. Chest pain syndrome Resolved  3. Injury brain, traumatic, without loss of consciousness, sequela Stable  4. Cognitive impairment Stable  5. Type 2 diabetes mellitus with diabetic peripheral angiopathy without gangrene Stable and being followed by vascular surgeon

## 2014-08-23 NOTE — Patient Instructions (Signed)
Follow-up whenever four-month A1c was still

## 2014-08-28 DIAGNOSIS — F329 Major depressive disorder, single episode, unspecified: Secondary | ICD-10-CM | POA: Diagnosis not present

## 2014-08-31 DIAGNOSIS — M6281 Muscle weakness (generalized): Secondary | ICD-10-CM | POA: Diagnosis not present

## 2014-08-31 DIAGNOSIS — I69152 Hemiplegia and hemiparesis following nontraumatic intracerebral hemorrhage affecting left dominant side: Secondary | ICD-10-CM | POA: Diagnosis not present

## 2014-09-05 DIAGNOSIS — R609 Edema, unspecified: Secondary | ICD-10-CM | POA: Diagnosis not present

## 2014-09-05 DIAGNOSIS — I739 Peripheral vascular disease, unspecified: Secondary | ICD-10-CM | POA: Diagnosis not present

## 2014-09-05 DIAGNOSIS — I639 Cerebral infarction, unspecified: Secondary | ICD-10-CM | POA: Diagnosis not present

## 2014-09-05 DIAGNOSIS — I890 Lymphedema, not elsewhere classified: Secondary | ICD-10-CM | POA: Diagnosis not present

## 2014-09-05 DIAGNOSIS — M79609 Pain in unspecified limb: Secondary | ICD-10-CM | POA: Diagnosis not present

## 2014-09-05 DIAGNOSIS — M6281 Muscle weakness (generalized): Secondary | ICD-10-CM | POA: Diagnosis not present

## 2014-09-05 DIAGNOSIS — M7989 Other specified soft tissue disorders: Secondary | ICD-10-CM | POA: Diagnosis not present

## 2014-09-05 DIAGNOSIS — I872 Venous insufficiency (chronic) (peripheral): Secondary | ICD-10-CM | POA: Diagnosis not present

## 2014-09-05 DIAGNOSIS — I69152 Hemiplegia and hemiparesis following nontraumatic intracerebral hemorrhage affecting left dominant side: Secondary | ICD-10-CM | POA: Diagnosis not present

## 2014-09-06 ENCOUNTER — Ambulatory Visit (INDEPENDENT_AMBULATORY_CARE_PROVIDER_SITE_OTHER): Payer: Medicare Other | Admitting: Podiatry

## 2014-09-06 ENCOUNTER — Encounter: Payer: Self-pay | Admitting: Podiatry

## 2014-09-06 VITALS — BP 118/85 | HR 109 | Resp 18

## 2014-09-06 DIAGNOSIS — B351 Tinea unguium: Secondary | ICD-10-CM

## 2014-09-06 DIAGNOSIS — M79676 Pain in unspecified toe(s): Secondary | ICD-10-CM

## 2014-09-06 NOTE — Progress Notes (Signed)
Patient ID: [name] [name], female   DOB: [birth year], 54 y.o.   MRN: [id number]   Subjective: [name] [name]  presents the office today for painful elongated toenails, which she is unable to do herself. She presents today with a representative from her facility. Denies any redness or drainage from the nails.  Denies any recent changes or any new complaints.  Objective: Awake, Alert, NAD; presents in a wheelchair.  DP/PT pulses decreased, CRT delayed b/l. Nails hypertrophic, dystrophic, elongated, brittle x10. No surrounding erythema or drainage. There subjective tenderness on nails 1-5 bilaterally. There is  dry, scaly skin on the plantar right foot. There is currently no skin changes interdigitally.  No open skin lesions or pre-ulcerative lesion No other areas of tenderness to bilateral lower extremity is. No calf pain with compression, swelling, warmth, erythema.   Assessment: 54 year old female with symptomatic onychomycosis, PVD  Plan: -Treatment options discussed including alternatives, risks, complications. -Nail sharply debrided H37 without complications. -Moisturizer to skin daily, not between toes.  -Discussed the importance of daily foot inspection. -Followup in 3 months or sooner if any problems are to arise or any changes symptoms. In the meantime call any questions, concerns.  Celesta Gentile, DPM

## 2014-09-12 DIAGNOSIS — I69152 Hemiplegia and hemiparesis following nontraumatic intracerebral hemorrhage affecting left dominant side: Secondary | ICD-10-CM | POA: Diagnosis not present

## 2014-09-12 DIAGNOSIS — M6281 Muscle weakness (generalized): Secondary | ICD-10-CM | POA: Diagnosis not present

## 2014-09-13 ENCOUNTER — Other Ambulatory Visit: Payer: Self-pay | Admitting: Family Medicine

## 2014-09-14 ENCOUNTER — Other Ambulatory Visit: Payer: Self-pay | Admitting: Family Medicine

## 2014-09-21 DIAGNOSIS — M6281 Muscle weakness (generalized): Secondary | ICD-10-CM | POA: Diagnosis not present

## 2014-09-21 DIAGNOSIS — I69152 Hemiplegia and hemiparesis following nontraumatic intracerebral hemorrhage affecting left dominant side: Secondary | ICD-10-CM | POA: Diagnosis not present

## 2014-09-28 DIAGNOSIS — M6281 Muscle weakness (generalized): Secondary | ICD-10-CM | POA: Diagnosis not present

## 2014-09-28 DIAGNOSIS — I69152 Hemiplegia and hemiparesis following nontraumatic intracerebral hemorrhage affecting left dominant side: Secondary | ICD-10-CM | POA: Diagnosis not present

## 2014-10-03 ENCOUNTER — Ambulatory Visit: Payer: Medicare Other

## 2014-10-10 ENCOUNTER — Other Ambulatory Visit: Payer: Self-pay | Admitting: Family Medicine

## 2014-10-12 ENCOUNTER — Other Ambulatory Visit: Payer: Self-pay

## 2014-10-12 MED ORDER — DESMOPRESSIN ACETATE 0.2 MG PO TABS: 0.2000 mg | ORAL_TABLET | Freq: Every day | ORAL | Status: DC

## 2014-10-12 MED ORDER — RAMIPRIL 5 MG PO CAPS: 5.0000 mg | ORAL_CAPSULE | Freq: Every day | ORAL | Status: DC

## 2014-10-12 MED ORDER — SITAGLIPTIN PHOSPHATE 50 MG PO TABS: ORAL_TABLET | ORAL | Status: DC

## 2014-10-12 MED ORDER — DOCUSATE SODIUM 100 MG PO CAPS: 100.0000 mg | ORAL_CAPSULE | Freq: Two times a day (BID) | ORAL | Status: DC

## 2014-10-15 ENCOUNTER — Ambulatory Visit: Payer: Self-pay | Admitting: Family Medicine

## 2014-10-16 ENCOUNTER — Other Ambulatory Visit: Payer: Self-pay | Admitting: Family Medicine

## 2014-10-17 ENCOUNTER — Encounter: Payer: Self-pay | Admitting: Family Medicine

## 2014-10-17 ENCOUNTER — Ambulatory Visit (INDEPENDENT_AMBULATORY_CARE_PROVIDER_SITE_OTHER): Payer: Medicare Other | Admitting: Family Medicine

## 2014-10-17 VITALS — BP 128/82 | HR 93 | Temp 98.3°F | Resp 18

## 2014-10-17 DIAGNOSIS — E1051 Type 1 diabetes mellitus with diabetic peripheral angiopathy without gangrene: Secondary | ICD-10-CM

## 2014-10-17 DIAGNOSIS — Z23 Encounter for immunization: Secondary | ICD-10-CM

## 2014-10-17 DIAGNOSIS — R8290 Unspecified abnormal findings in urine: Secondary | ICD-10-CM

## 2014-10-17 DIAGNOSIS — E785 Hyperlipidemia, unspecified: Secondary | ICD-10-CM | POA: Diagnosis not present

## 2014-10-17 DIAGNOSIS — I70209 Unspecified atherosclerosis of native arteries of extremities, unspecified extremity: Secondary | ICD-10-CM

## 2014-10-17 DIAGNOSIS — L22 Diaper dermatitis: Secondary | ICD-10-CM | POA: Diagnosis not present

## 2014-10-17 DIAGNOSIS — E1165 Type 2 diabetes mellitus with hyperglycemia: Secondary | ICD-10-CM

## 2014-10-17 DIAGNOSIS — E1065 Type 1 diabetes mellitus with hyperglycemia: Principal | ICD-10-CM

## 2014-10-17 DIAGNOSIS — F324 Major depressive disorder, single episode, in partial remission: Secondary | ICD-10-CM

## 2014-10-17 DIAGNOSIS — E1151 Type 2 diabetes mellitus with diabetic peripheral angiopathy without gangrene: Secondary | ICD-10-CM

## 2014-10-17 DIAGNOSIS — E1159 Type 2 diabetes mellitus with other circulatory complications: Secondary | ICD-10-CM | POA: Diagnosis not present

## 2014-10-17 DIAGNOSIS — S0990XD Unspecified injury of head, subsequent encounter: Secondary | ICD-10-CM

## 2014-10-17 DIAGNOSIS — R4189 Other symptoms and signs involving cognitive functions and awareness: Secondary | ICD-10-CM | POA: Diagnosis not present

## 2014-10-17 DIAGNOSIS — I10 Essential (primary) hypertension: Secondary | ICD-10-CM | POA: Diagnosis not present

## 2014-10-17 DIAGNOSIS — IMO0002 Reserved for concepts with insufficient information to code with codable children: Secondary | ICD-10-CM

## 2014-10-17 LAB — POCT URINALYSIS DIPSTICK
BILIRUBIN UA: NEGATIVE
Glucose, UA: NEGATIVE
KETONES UA: NEGATIVE
NITRITE UA: NEGATIVE
PH UA: 5
Spec Grav, UA: 1.02
Urobilinogen, UA: 0.2

## 2014-10-17 LAB — GLUCOSE, POCT (MANUAL RESULT ENTRY): POC GLUCOSE: 161 mg/dL — AB (ref 70–99)

## 2014-10-17 LAB — POCT GLYCOSYLATED HEMOGLOBIN (HGB A1C): HEMOGLOBIN A1C: 7.3

## 2014-10-17 MED ORDER — NYSTATIN 100000 UNIT/GM EX CREA: TOPICAL_CREAM | CUTANEOUS | Status: DC | PRN

## 2014-10-17 MED ORDER — POLYETHYLENE GLYCOL 3350 17 GM/SCOOP PO POWD: 1.0000 | Freq: Once | ORAL | Status: DC

## 2014-10-17 MED ORDER — RAMIPRIL 10 MG PO CAPS: 10.0000 mg | ORAL_CAPSULE | Freq: Every day | ORAL | Status: DC

## 2014-10-17 MED ORDER — CIPROFLOXACIN HCL 500 MG PO TABS: 500.0000 mg | ORAL_TABLET | Freq: Two times a day (BID) | ORAL | Status: DC

## 2014-10-17 NOTE — Progress Notes (Signed)
Name: [name] [name]   MRN: [id number]    DOB: 06-Aug-1960   Date:10/17/2014       Progress Note  Subjective  Chief Complaint  Chief Complaint  Patient presents with  . Diabetes    BS has been running in the 180's  . Hypertension    BP elevated at 140/100  . Urinary Tract Infection    terrible odor when urinating  . OTHER    discuss memory, forgetfulness and guardianship    HPI  Diabetes  Patient presents for follow-up of diabetes which is present for over 5 years. Is currently on a regimen of Januvia once daily. . Patient states is noncompliant with their diet and exercise. There's been no hypoglycemic episodes and there has not been any polyuria polydipsia polyphagia. His average fasting glucoses been in the low around 150 with a high around 180 . There is no end organ disease.  Last diabetic eye exam was earlier in this year.   Last visit with dietitian was many years ago.  Last microalbumin was obtained December 2015 with 0 .   Hypertension   Patient presents for follow-up of hypertension. It has been present for over 5 years.  Patient states that there is compliance with medical regimen which consists of ramipril 5 mg daily . There is no end organ disease. Cardiac risk factors include hypertension hyperlipidemia and diabetes.  Exercise regimen consist of none .  Diet consist of no salt or glucose restriction currently .  Hyperlipidemia  Patient has a history of hyperlipidemia for over 5 years.  Current medical regimen consist of simvastatin 10 mg daily at bedtime .  Compliance is good .  Diet and exercise are currently followed minimally .  Risk factors for cardiovascular disease include hyperlipidemia , diabetes, hypertension, inactivity, peripheral vascular disease .   There have been no side effects from the medication.    Traumatic brain injury with spastic quadriparesis  Patient remains in a group home setting. There is more more problem with memory and decision making  regarding her ongoing care. A sister is considered the guardian but has been having normal difficulty on her own makes decision making for [name] [name] more difficult. Staff is asking whether or not he'll be supportive and considering another guardian to help with  decisi the patienton making her medical care which I am agreeable to.   Constipation  Patient have recurrent problem with constipation. She's been using over-the-counter Dulcolax with minimal improvement. She has minimal amount of movement being wheelchair bound from her traumatic brain injury. Plan Will give a trial of MiraLAX 17 g by mouth daily at bedtime when necessary constipation and encourage as much as possible tear.   Past Medical History  Diagnosis Date  . Traumatic brain injury   . Hypertension   . Diabetes mellitus without complication   . Osteoporosis   . Arthritis   . Spastic quadriparesis   . Borderline intellectual functioning   . Double vision   . 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     Social History  Substance Use Topics  . Smoking status: Former Research scientist (life sciences)  . Smokeless tobacco: Never Used  . Alcohol Use: No     Current outpatient prescriptions:  .  alendronate (FOSAMAX) 70 MG tablet, 1 TAB PO WEEKLY EARLY AM BEFORE FOOD/MEDS W/WATER. DON'T LIE DOWN FOR30 MIN (OSTEOPOROSIS) *NO CRUSH*, Disp: 4 tablet, Rfl: 0 .  ALPRAZolam (XANAX) 0.5 MG tablet, Take 0.5 mg by mouth 2 (two) times daily as needed (agitation).,  Disp: , Rfl:  .  carboxymethylcellulose (REFRESH PLUS) 0.5 % SOLN, Place 1 drop into both eyes 3 (three) times daily as needed (dry eyes)., Disp: , Rfl:  .  ciprofloxacin (CIPRO) 500 MG tablet, Take 1 tablet (500 mg total) by mouth 2 (two) times daily., Disp: 6 tablet, Rfl: 0 .  desmopressin (DDAVP) 0.2 MG tablet, Take 1 tablet (0.2 mg total) by mouth at bedtime., Disp: 30 tablet, Rfl: 2 .  docusate sodium (COLACE) 100 MG capsule, Take 1 capsule (100 mg total) by mouth 2 (two) times daily., Disp: 10 capsule, Rfl:  0 .  donepezil (ARICEPT) 10 MG tablet, TAKE 1 TABLET BY MOUTH TWICE A DAY (DEMENTIA), Disp: 60 tablet, Rfl: 0 .  ferrous sulfate 325 (65 FE) MG tablet, Take 325 mg by mouth daily., Disp: , Rfl:  .  FLUoxetine (PROZAC) 10 MG tablet, Take 30 mg by mouth every morning., Disp: , Rfl:  .  Lurasidone HCl 20 MG TABS, Take 1 tablet by mouth every morning., Disp: , Rfl:  .  memantine (NAMENDA XR) 28 MG CP24 24 hr capsule, Take 28 mg by mouth daily., Disp: , Rfl:  .  nortriptyline (PAMELOR) 25 MG capsule, Take 25 mg by mouth at bedtime., Disp: , Rfl:  .  nystatin cream (MYCOSTATIN), Apply topically as needed for dry skin., Disp: 120 g, Rfl: 0 .  omeprazole (PRILOSEC) 20 MG capsule, Take 20 mg by mouth daily., Disp: , Rfl:  .  polyethylene glycol powder (GLYCOLAX/MIRALAX) powder, Take 255 g by mouth once., Disp: 3350 g, Rfl: 1 .  promethazine (PHENERGAN) 25 MG tablet, Take 12.5-25 mg by mouth every 6 (six) hours as needed for nausea., Disp: , Rfl:  .  ramipril (ALTACE) 10 MG capsule, Take 1 capsule (10 mg total) by mouth daily., Disp: 90 capsule, Rfl: 3 .  simvastatin (ZOCOR) 10 MG tablet, Take 10 mg by mouth at bedtime., Disp: , Rfl:  .  sitaGLIPtin (JANUVIA) 50 MG tablet, ONE TABLET BY MOUTH ONCE A DAY. (DIABETES), Disp: 30 tablet, Rfl: 0 .  tolterodine (DETROL LA) 4 MG 24 hr capsule, Take 4 mg by mouth at bedtime., Disp: , Rfl:   Allergies  Allergen Reactions  . Dilantin [Phenytoin] Other (See Comments)    unknown  . Penicillins Other (See Comments)    unknown  . Sulfa Antibiotics Rash    Review of Systems  Constitutional: Negative for fever, chills and weight loss.  HENT: Negative for congestion, hearing loss, sore throat and tinnitus.   Eyes: Negative for blurred vision, double vision and redness.  Respiratory: Negative for cough, hemoptysis and shortness of breath.   Cardiovascular: Positive for claudication. Negative for chest pain, palpitations, orthopnea and leg swelling.   Gastrointestinal: Positive for constipation. Negative for heartburn, nausea, vomiting, diarrhea and blood in stool.  Genitourinary: Positive for dysuria. Negative for urgency, frequency and hematuria.  Musculoskeletal: Positive for joint pain. Negative for myalgias, back pain, falls and neck pain.  Skin: Negative for itching.  Neurological: Positive for weakness. Negative for dizziness, tingling, tremors, focal weakness, seizures, loss of consciousness and headaches.       Spastic quadriparesis  Endo/Heme/Allergies: Does not bruise/bleed easily.  Psychiatric/Behavioral:       Very labile affect normal     Objective  Filed Vitals:   10/17/14 0939  BP: 128/82  Pulse: 93  Temp: 98.3 F (36.8 C)  TempSrc: Oral  Resp: 18  SpO2: 98%     Physical Exam  Constitutional: She is  well-developed, well-nourished, and in no distress.  HENT:  Head: Normocephalic.  Eyes: EOM are normal. Pupils are equal, round, and reactive to light.  Neck: Normal range of motion. No thyromegaly present.  Cardiovascular: Normal rate, regular rhythm and normal heart sounds.   No murmur heard. Pulmonary/Chest: Effort normal and breath sounds normal.  Abdominal: Soft. Bowel sounds are normal.  Musculoskeletal: Normal range of motion. She exhibits no edema.  Neurological: No cranial nerve deficit.  Patient is very ataxic with spastic quadriparesis  and hyperreflexia and increased tone  Skin: Skin is warm and dry. No rash noted.  Psychiatric: Memory and affect normal.      Assessment & Plan   1. Uncontrolled type 1 diabetes mellitus with peripheral vascular disease  - POCT HgB A1C - POCT Glucose (CBG) - POCT urinalysis dipstick  2. Diaper rash  - nystatin cream (MYCOSTATIN); Apply topically as needed for dry skin.  Dispense: 120 g; Refill: 0  3. Abnormal urine odor  - POCT urinalysis dipstick - Urine Culture  4. Essential hypertension Elevated today increase her ramipril - TSH  5.  Hyperlipidemia  - TSH  6. Cognitive impairment UnchangedTSH  7. Closed head injury, subsequent encounter A need some more supervision and medical decision making 8. Major depressive disorder, single episode, in partial remstable  9. Need for influenza vacgiven today - Flu Vaccine QUAD 36+ mos PF IM (Fluarix & Fluzone Quad PF)

## 2014-10-18 LAB — TSH: TSH: 0.946 u[IU]/mL (ref 0.450–4.500)

## 2014-10-18 LAB — URINE CULTURE

## 2014-10-22 ENCOUNTER — Telehealth: Payer: Self-pay | Admitting: Emergency Medicine

## 2014-10-22 NOTE — Telephone Encounter (Signed)
Becky at [facility] notified.

## 2014-10-23 ENCOUNTER — Encounter: Payer: Self-pay | Admitting: Family Medicine

## 2014-10-24 ENCOUNTER — Telehealth: Payer: Self-pay | Admitting: Family Medicine

## 2014-10-24 NOTE — Telephone Encounter (Signed)
[name] [name] is requesting a order for physical therapy evaluation for treatment.

## 2014-10-25 NOTE — Telephone Encounter (Signed)
Order faxed to Bethel Born at [facility]

## 2014-11-16 ENCOUNTER — Telehealth: Payer: Self-pay | Admitting: Family Medicine

## 2014-11-16 NOTE — Telephone Encounter (Signed)
[name] [name] from [name] [name] is requesting the last two lab results to be faxed to [name] [name] (F) [phone]

## 2014-11-22 ENCOUNTER — Other Ambulatory Visit: Payer: Self-pay | Admitting: Family Medicine

## 2014-11-26 NOTE — Telephone Encounter (Signed)
[name] [name] Becky picked up copy of labs

## 2014-11-27 DIAGNOSIS — F329 Major depressive disorder, single episode, unspecified: Secondary | ICD-10-CM | POA: Diagnosis not present

## 2014-12-03 ENCOUNTER — Ambulatory Visit (INDEPENDENT_AMBULATORY_CARE_PROVIDER_SITE_OTHER): Payer: Medicare Other | Admitting: Family Medicine

## 2014-12-03 ENCOUNTER — Encounter: Payer: Self-pay | Admitting: Family Medicine

## 2014-12-03 VITALS — BP 124/84 | HR 99 | Temp 98.3°F | Resp 16

## 2014-12-03 DIAGNOSIS — S069XAA Unspecified intracranial injury with loss of consciousness status unknown, initial encounter: Secondary | ICD-10-CM | POA: Insufficient documentation

## 2014-12-03 DIAGNOSIS — Z0000 Encounter for general adult medical examination without abnormal findings: Secondary | ICD-10-CM | POA: Diagnosis not present

## 2014-12-03 DIAGNOSIS — S069X9A Unspecified intracranial injury with loss of consciousness of unspecified duration, initial encounter: Secondary | ICD-10-CM | POA: Insufficient documentation
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
## 2014-12-09 NOTE — Addendum Note (Signed)
Addended by: Ashok Norris on: 12/09/2014 04:31 PM   Modules accepted: Miquel Dunn

## 2014-12-11 ENCOUNTER — Ambulatory Visit (INDEPENDENT_AMBULATORY_CARE_PROVIDER_SITE_OTHER): Payer: Medicare Other | Admitting: Sports Medicine

## 2014-12-11 ENCOUNTER — Encounter: Payer: Self-pay | Admitting: Sports Medicine

## 2014-12-11 DIAGNOSIS — E119 Type 2 diabetes mellitus without complications: Secondary | ICD-10-CM

## 2014-12-11 DIAGNOSIS — B351 Tinea unguium: Secondary | ICD-10-CM | POA: Diagnosis not present

## 2014-12-11 DIAGNOSIS — M79676 Pain in unspecified toe(s): Secondary | ICD-10-CM

## 2014-12-11 NOTE — Progress Notes (Signed)
Patient ID: [name] [name], female   DOB: 04-09-1960, 54 y.o.   MRN: [id number] Subjective: [name] [name] is a 54 y.o. female patient with history of type 2 diabetes who presents to office today complaining of [name] [name], painful nails; unable to trim. Patient is assisted by her caregiver; states that the glucose reading this morning was not recorded. Patient denies any new changes in medication or new problems. Patient denies any new cramping, numbness, burning or tingling in the legs.  Patient Active Problem List   Diagnosis Date Noted  . Type 2 diabetes mellitus ([place]) 12/03/2014  . Traumatic brain injury ([place]) 12/03/2014  . Dehydration 08/23/2014  . Chronic constipation 08/23/2014  . Clinical depression 08/23/2014  . Diabetes mellitus, type 2 ([place]) 08/23/2014  . BP (high blood pressure) 08/23/2014  . Injury brain, traumatic ([place]) 08/23/2014  . OP (osteoporosis) 08/23/2014  . HLD (hyperlipidemia) 08/23/2014  . Chest pain syndrome 08/23/2014  . Cognitive impairment 08/23/2014   Current Outpatient Prescriptions on File Prior to Visit  Medication Sig Dispense Refill  . alendronate (FOSAMAX) 70 MG tablet 1 TAB PO WEEKLY EARLY AM BEFORE FOOD/MEDS W/WATER. DON'T LIE DOWN FOR30 MIN (OSTEOPOROSIS) *NO CRUSH* 4 tablet 0  . ALPRAZolam (XANAX) 0.5 MG tablet Take 0.5 mg by mouth 2 (two) times daily as needed (agitation).    . carboxymethylcellulose (REFRESH PLUS) 0.5 % SOLN Place 1 drop into both eyes 3 (three) times daily as needed (dry eyes).    . ciprofloxacin (CIPRO) 500 MG tablet Take 1 tablet (500 mg total) by mouth 2 (two) times daily. 6 tablet 0  . desmopressin (DDAVP) 0.2 MG tablet Take 1 tablet (0.2 mg total) by mouth at bedtime. 30 tablet 2  . docusate sodium (COLACE) 100 MG capsule Take 1 capsule (100 mg total) by mouth 2 (two) times daily. 10 capsule 0  . donepezil (ARICEPT) 10 MG tablet TAKE 1 TABLET BY MOUTH TWICE A DAY (DEMENTIA) 60 tablet 0  . ferrous sulfate 325 (65 FE) MG tablet Take 325  mg by mouth daily.    Marland Kitchen FLUoxetine (PROZAC) 10 MG tablet Take 30 mg by mouth every morning.    . Lurasidone HCl 20 MG TABS Take 1 tablet by mouth every morning.    . memantine (NAMENDA XR) 28 MG CP24 24 hr capsule Take 28 mg by mouth daily.    . nortriptyline (PAMELOR) 25 MG capsule Take 25 mg by mouth at bedtime.    Marland Kitchen nystatin cream (MYCOSTATIN) Apply topically as needed for dry skin. 120 g 0  . omeprazole (PRILOSEC) 20 MG capsule Take 20 mg by mouth daily.    . polyethylene glycol powder (GLYCOLAX/MIRALAX) powder Take 255 g by mouth once. 3350 g 1  . promethazine (PHENERGAN) 25 MG tablet Take 12.5-25 mg by mouth every 6 (six) hours as needed for nausea.    . ramipril (ALTACE) 10 MG capsule Take 1 capsule (10 mg total) by mouth daily. 90 capsule 3  . simvastatin (ZOCOR) 10 MG tablet Take 10 mg by mouth at bedtime.    . sitaGLIPtin (JANUVIA) 50 MG tablet ONE TABLET BY MOUTH ONCE A DAY. (DIABETES) 30 tablet 0  . tolterodine (DETROL LA) 4 MG 24 hr capsule TAKE 1 CAPSULE BY MOUTH ONCE DAILY FOR BLADDER. **DO NOT CRUSH** 30 capsule 0   No current facility-administered medications on file prior to visit.   Allergies  Allergen Reactions  . Dilantin [Phenytoin] Other (See Comments)    unknown  . Penicillins Other (See  Comments)    unknown  . Sulfa Antibiotics Rash    Labs: HEMOGLOBIN A1C- 7.3 (09/2014)  Objective: General: Patient is awake, alert, and oriented x 3 and in no acute distress in wheelchair.   Integument: Skin is warm, dry and supple bilateral. Nails are tender, [place], thickened and  dystrophic with subungual debris, consistent with onychomycosis, 1-5 bilateral. No signs of infection. No open lesions or preulcerative lesions present bilateral. Remaining integument unremarkable.  Vasculature:  Dorsalis Pedis pulse 1/4 bilateral. Posterior Tibial pulse  1/4 bilateral.  Capillary fill time <3 sec 1-5 bilateral. Scant hair growth to the level of the digits. Temperature gradient  within normal limits. No varicosities present bilateral. No edema present bilateral.   Neurology: The patient has intact sensation measured with a 5.07/10g Semmes Weinstein Monofilament at all pedal sites bilateral . Vibratory sensation diminished bilateral with tuning fork. No Babinski sign present bilateral.   Musculoskeletal: No gross pedal deformities noted bilateral. Muscular strength 4/5 in all lower extremity muscular groups bilateral without pain or limitation on range of motion with the right>left sided weakness secondary to TBI. No tenderness with calf compression bilateral.  Assessment and Plan: Problem List Items Addressed This Visit    None    Visit Diagnoses    Dermatophytosis of nail    -  Primary    Pain of toe, unspecified laterality        Diabetes mellitus without complication ([place])          -Examined patient. -Discussed and educated patient on diabetic foot care, especially with  regards to the vascular, neurological and musculoskeletal systems.  -Stressed the importance of good glycemic control and the detriment of not  controlling glucose levels in relation to the foot. -Mechanically debrided all nails 1-5 bilateral using sterile nail nipper and filed with dremel without incident  -Answered all patient questions -Patient to return in 3 months for at risk foot care -Patient advised to call the office if any problems or questions arise in the  Meantime.  Landis Martins, DPM

## 2015-01-11 ENCOUNTER — Other Ambulatory Visit: Payer: Self-pay | Admitting: Family Medicine

## 2015-01-14 ENCOUNTER — Other Ambulatory Visit: Payer: Self-pay

## 2015-01-14 MED ORDER — SIMVASTATIN 10 MG PO TABS: 10.0000 mg | ORAL_TABLET | Freq: Every day | ORAL | Status: DC

## 2015-01-14 MED ORDER — DESMOPRESSIN ACETATE 0.2 MG PO TABS: 200.0000 ug | ORAL_TABLET | Freq: Every day | ORAL | Status: DC

## 2015-02-18 ENCOUNTER — Ambulatory Visit (INDEPENDENT_AMBULATORY_CARE_PROVIDER_SITE_OTHER): Payer: Medicare Other | Admitting: Family Medicine

## 2015-02-18 ENCOUNTER — Encounter: Payer: Self-pay | Admitting: Family Medicine

## 2015-02-18 VITALS — BP 122/80 | HR 94 | Temp 97.9°F | Resp 18 | Ht 67.0 in | Wt 180.0 lb

## 2015-02-18 DIAGNOSIS — I10 Essential (primary) hypertension: Secondary | ICD-10-CM | POA: Diagnosis not present

## 2015-02-18 DIAGNOSIS — E785 Hyperlipidemia, unspecified: Secondary | ICD-10-CM | POA: Diagnosis not present

## 2015-02-18 DIAGNOSIS — R4189 Other symptoms and signs involving cognitive functions and awareness: Secondary | ICD-10-CM

## 2015-02-18 DIAGNOSIS — S069X6S Unspecified intracranial injury with loss of consciousness greater than 24 hours without return to pre-existing conscious level with patient surviving, sequela: Secondary | ICD-10-CM | POA: Diagnosis not present

## 2015-02-18 DIAGNOSIS — E1169 Type 2 diabetes mellitus with other specified complication: Secondary | ICD-10-CM | POA: Diagnosis not present

## 2015-02-18 LAB — GLUCOSE, POCT (MANUAL RESULT ENTRY): POC Glucose: 143 mg/dl — AB (ref 70–99)

## 2015-02-18 LAB — POCT GLYCOSYLATED HEMOGLOBIN (HGB A1C): HEMOGLOBIN A1C: 7.1
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
## 2015-02-19 LAB — COMPREHENSIVE METABOLIC PANEL
A/G RATIO: 1.4 (ref 1.1–2.5)
ALK PHOS: 118 IU/L — AB (ref 39–117)
ALT: 21 IU/L (ref 0–32)
AST: 17 IU/L (ref 0–40)
Albumin: 4.2 g/dL (ref 3.5–5.5)
BUN/Creatinine Ratio: 11 (ref 9–23)
BUN: 10 mg/dL (ref 6–24)
CHLORIDE: 98 mmol/L (ref 96–106)
CO2: 24 mmol/L (ref 18–29)
Calcium: 9.6 mg/dL (ref 8.7–10.2)
Creatinine, Ser: 0.94 mg/dL (ref 0.57–1.00)
GFR calc non Af Amer: 68 mL/min/{1.73_m2} (ref >59)
GFR, EST AFRICAN AMERICAN: 78 mL/min/{1.73_m2} (ref >59)
GLUCOSE: 136 mg/dL — AB (ref 65–99)
Globulin, Total: 3 g/dL (ref 1.5–4.5)
POTASSIUM: 4.6 mmol/L (ref 3.5–5.2)
Sodium: 139 mmol/L (ref 134–144)
TOTAL PROTEIN: 7.2 g/dL (ref 6.0–8.5)

## 2015-02-19 LAB — LIPID PANEL
CHOLESTEROL TOTAL: 178 mg/dL (ref 100–199)
Chol/HDL Ratio: 1.9 ratio units (ref 0.0–4.4)
HDL: 96 mg/dL (ref >39)
LDL Calculated: 67 mg/dL (ref 0–99)
Triglycerides: 77 mg/dL (ref 0–149)
VLDL CHOLESTEROL CAL: 15 mg/dL (ref 5–40)

## 2015-02-19 LAB — TSH: TSH: 1.8 u[IU]/mL (ref 0.450–4.500)

## 2015-02-21 ENCOUNTER — Telehealth: Payer: Self-pay | Admitting: Emergency Medicine

## 2015-02-21 NOTE — Telephone Encounter (Signed)
Copy faxed to Merlene Morse per Care Coordinator

## 2015-03-15 ENCOUNTER — Encounter: Payer: Self-pay | Admitting: Sports Medicine

## 2015-03-15 ENCOUNTER — Ambulatory Visit (INDEPENDENT_AMBULATORY_CARE_PROVIDER_SITE_OTHER): Payer: Medicare Other | Admitting: Sports Medicine

## 2015-03-15 ENCOUNTER — Other Ambulatory Visit: Payer: Self-pay | Admitting: Family Medicine

## 2015-03-15 DIAGNOSIS — M79676 Pain in unspecified toe(s): Secondary | ICD-10-CM

## 2015-03-15 DIAGNOSIS — E119 Type 2 diabetes mellitus without complications: Secondary | ICD-10-CM | POA: Diagnosis not present

## 2015-03-15 DIAGNOSIS — B353 Tinea pedis: Secondary | ICD-10-CM

## 2015-03-15 DIAGNOSIS — B351 Tinea unguium: Secondary | ICD-10-CM | POA: Diagnosis not present

## 2015-03-15 MED ORDER — NORTRIPTYLINE HCL 25 MG PO CAPS: 25.0000 mg | ORAL_CAPSULE | Freq: Every day | ORAL | Status: DC

## 2015-03-15 NOTE — Progress Notes (Signed)
Patient ID: [name] [name], female   DOB: 21-Oct-1960, 55 y.o.   MRN: [id number]   Subjective: [name] [name] is a 55 y.o. female patient with history of type 2 diabetes who returns to office today complaining of [place], painful nails; unable to trim. Patient is assisted by her caregiver; states that the glucose reading this morning was not recorded , but runs between 150-175. Patient denies any new changes in medication or new problems. Patient denies any new cramping, numbness, burning or tingling in the legs.  Patient Active Problem List   Diagnosis Date Noted  . Type 2 diabetes mellitus ([place]) 12/03/2014  . Traumatic brain injury ([place]) 12/03/2014  . Dehydration 08/23/2014  . Chronic constipation 08/23/2014  . Clinical depression 08/23/2014  . Diabetes mellitus, type 2 ([place]) 08/23/2014  . BP (high blood pressure) 08/23/2014  . Injury brain, traumatic ([place]) 08/23/2014  . OP (osteoporosis) 08/23/2014  . HLD (hyperlipidemia) 08/23/2014  . Chest pain syndrome 08/23/2014  . Cognitive impairment 08/23/2014   Current Outpatient Prescriptions on File Prior to Visit  Medication Sig Dispense Refill  . alendronate (FOSAMAX) 70 MG tablet 1 TAB PO WEEKLY EARLY AM BEFORE FOOD/MEDS W/WATER. DON'T LIE DOWN FOR30 MIN (OSTEOPOROSIS) *NO CRUSH* 4 tablet 0  . ALPRAZolam (XANAX) 0.5 MG tablet Take 0.5 mg by mouth 2 (two) times daily as needed (agitation).    . carboxymethylcellulose (REFRESH PLUS) 0.5 % SOLN Place 1 drop into both eyes 3 (three) times daily as needed (dry eyes).    . ciprofloxacin (CIPRO) 500 MG tablet Take 1 tablet (500 mg total) by mouth 2 (two) times daily. 6 tablet 0  . desmopressin (DDAVP) 0.2 MG tablet Take 1 tablet (200 mcg total) by mouth at bedtime. 30 tablet 0  . docusate sodium (COLACE) 100 MG capsule Take 1 capsule (100 mg total) by mouth 2 (two) times daily. 10 capsule 0  . donepezil (ARICEPT) 10 MG tablet TAKE 1 TABLET BY MOUTH TWICE A DAY (DEMENTIA) 60 tablet 0  . ferrous sulfate  325 (65 FE) MG tablet Take 325 mg by mouth daily.    Marland Kitchen FLUoxetine (PROZAC) 10 MG tablet Take 30 mg by mouth every morning.    Marland Kitchen glucose blood test strip     . Lurasidone HCl 20 MG TABS Take 1 tablet by mouth every morning.    . memantine (NAMENDA XR) 28 MG CP24 24 hr capsule Take 28 mg by mouth daily.    Marland Kitchen nystatin cream (MYCOSTATIN) Apply topically as needed for dry skin. 120 g 0  . omeprazole (PRILOSEC) 20 MG capsule Take 20 mg by mouth daily.    . polyethylene glycol powder (GLYCOLAX/MIRALAX) powder Take 255 g by mouth once. 3350 g 1  . promethazine (PHENERGAN) 25 MG tablet Take 12.5-25 mg by mouth every 6 (six) hours as needed for nausea.    . ramipril (ALTACE) 10 MG capsule Take 1 capsule (10 mg total) by mouth daily. 90 capsule 3  . simvastatin (ZOCOR) 10 MG tablet Take 1 tablet (10 mg total) by mouth at bedtime. 30 tablet 0  . sitaGLIPtin (JANUVIA) 50 MG tablet ONE TABLET BY MOUTH ONCE A DAY. (DIABETES) 30 tablet 0  . tolterodine (DETROL LA) 4 MG 24 hr capsule TAKE 1 CAPSULE BY MOUTH ONCE DAILY FOR BLADDER. **DO NOT CRUSH** 30 capsule 0   No current facility-administered medications on file prior to visit.   Allergies  Allergen Reactions  . Dilantin [Phenytoin] Other (See Comments)    unknown  .  Penicillins Other (See Comments)    unknown  . Phenytoin Sodium Extended Other (See Comments)  . Sulfa Antibiotics Rash    Labs: HEMOGLOBIN A1C- 7.1 (01/2015)  Objective: General: Patient is awake, alert, and oriented x 3 and in no acute distress in wheelchair.   Integument: Skin is dry and supple bilateral. Nails are tender, [place], thickened and  dystrophic with subungual debris, consistent with onychomycosis, 1-5 bilateral. Mild scaly skin to right foot. No open lesions or preulcerative lesions present bilateral. Remaining integument unremarkable.  Vasculature:  Dorsalis Pedis pulse 1/4 bilateral. Posterior Tibial pulse  1/4 bilateral.  Capillary fill time <3 sec 1-5 bilateral.  Scant hair growth to the level of the digits. Temperature gradient decreased on right. No varicosities present bilateral. No edema present bilateral.   Neurology: The patient has intact sensation measured with a 5.07/10g Semmes Weinstein Monofilament at all pedal sites bilateral . Vibratory sensation diminished bilateral with tuning fork. No Babinski sign present bilateral.   Musculoskeletal: No gross pedal deformities noted bilateral. Muscular strength 4/5 in all lower extremity muscular groups bilateral without pain or limitation on range of motion with the right>left sided weakness secondary to TBI. No tenderness with calf compression bilateral.  Assessment and Plan: Problem List Items Addressed This Visit    None    Visit Diagnoses    Dermatophytosis of nail    -  Primary    Pain of toe, unspecified laterality        Diabetes mellitus without complication (HCC)        Tinea pedis of right foot          -Examined patient. -Discussed and educated patient on diabetic foot care, especially with  regards to the vascular, neurological and musculoskeletal systems.  -Stressed the importance of good glycemic control and the detriment of not  controlling glucose levels in relation to the foot. -Mechanically debrided all nails 1-5 bilateral using sterile nail nipper and filed with dremel without incident  -Recommend cont with daily antifungal and moisturizing creams -Answered all patient questions -Patient to return in 3 months for at risk foot care -Patient advised to call the office if any problems or questions arise in the  Meantime.  Landis Martins, DPM

## 2015-03-22 ENCOUNTER — Other Ambulatory Visit: Payer: Self-pay | Admitting: Family Medicine

## 2015-03-22 MED ORDER — OMEPRAZOLE 20 MG PO CPDR: 20.0000 mg | DELAYED_RELEASE_CAPSULE | Freq: Every day | ORAL | Status: DC

## 2015-03-22 MED ORDER — DESMOPRESSIN ACETATE 0.2 MG PO TABS: 200.0000 ug | ORAL_TABLET | Freq: Every day | ORAL | Status: DC

## 2015-03-22 MED ORDER — CILOSTAZOL 50 MG PO TABS: 50.0000 mg | ORAL_TABLET | Freq: Two times a day (BID) | ORAL | Status: DC

## 2015-03-22 MED ORDER — ADVANCED PROBIOTIC 10 PO CAPS: 1.0000 | ORAL_CAPSULE | Freq: Once | ORAL | Status: DC

## 2015-03-22 MED ORDER — SIMVASTATIN 10 MG PO TABS: 10.0000 mg | ORAL_TABLET | Freq: Every day | ORAL | Status: DC

## 2015-03-26 ENCOUNTER — Emergency Department: Payer: Medicare Other

## 2015-03-26 ENCOUNTER — Emergency Department
Admission: EM | Admit: 2015-03-26 | Discharge: 2015-03-26 | Disposition: A | Discharge status: Home / Self Care | Payer: Medicare Other | Attending: Emergency Medicine | Admitting: Emergency Medicine

## 2015-03-26 ENCOUNTER — Encounter: Payer: Self-pay | Admitting: Emergency Medicine

## 2015-03-26 DIAGNOSIS — R05 Cough: Secondary | ICD-10-CM | POA: Insufficient documentation

## 2015-03-26 DIAGNOSIS — Z792 Long term (current) use of antibiotics: Secondary | ICD-10-CM | POA: Diagnosis not present

## 2015-03-26 DIAGNOSIS — Z880 Allergy status to penicillin: Secondary | ICD-10-CM | POA: Diagnosis not present

## 2015-03-26 DIAGNOSIS — Z79899 Other long term (current) drug therapy: Secondary | ICD-10-CM | POA: Insufficient documentation

## 2015-03-26 DIAGNOSIS — Z87891 Personal history of nicotine dependence: Secondary | ICD-10-CM | POA: Diagnosis not present

## 2015-03-26 DIAGNOSIS — E119 Type 2 diabetes mellitus without complications: Secondary | ICD-10-CM | POA: Insufficient documentation

## 2015-03-26 DIAGNOSIS — R053 Chronic cough: Secondary | ICD-10-CM

## 2015-03-26 DIAGNOSIS — I10 Essential (primary) hypertension: Secondary | ICD-10-CM | POA: Diagnosis not present

## 2015-03-26 LAB — CBC WITH DIFFERENTIAL/PLATELET
BASOS PCT: 1 %
Basophils Absolute: 0.1 10*3/uL (ref 0–0.1)
EOS ABS: 0.1 10*3/uL (ref 0–0.7)
Eosinophils Relative: 1 %
HEMATOCRIT: 41.3 % (ref 35.0–47.0)
HEMOGLOBIN: 13.8 g/dL (ref 12.0–16.0)
LYMPHS ABS: 2.2 10*3/uL (ref 1.0–3.6)
Lymphocytes Relative: 27 %
MCH: 29.7 pg (ref 26.0–34.0)
MCHC: 33.5 g/dL (ref 32.0–36.0)
MCV: 88.5 fL (ref 80.0–100.0)
Monocytes Absolute: 1 10*3/uL — ABNORMAL HIGH (ref 0.2–0.9)
Monocytes Relative: 12 %
NEUTROS ABS: 4.9 10*3/uL (ref 1.4–6.5)
NEUTROS PCT: 59 %
Platelets: 165 10*3/uL (ref 150–440)
RBC: 4.66 MIL/uL (ref 3.80–5.20)
RDW: 13.2 % (ref 11.5–14.5)
WBC: 8.2 10*3/uL (ref 3.6–11.0)

## 2015-03-26 LAB — BASIC METABOLIC PANEL
Anion gap: 8 (ref 5–15)
BUN: 8 mg/dL (ref 6–20)
CHLORIDE: 101 mmol/L (ref 101–111)
CO2: 28 mmol/L (ref 22–32)
Calcium: 9.3 mg/dL (ref 8.9–10.3)
Creatinine, Ser: 0.75 mg/dL (ref 0.44–1.00)
GFR calc non Af Amer: >60 mL/min (ref >60)
Glucose, Bld: 102 mg/dL — ABNORMAL HIGH (ref 65–99)
POTASSIUM: 4.1 mmol/L (ref 3.5–5.1)
SODIUM: 137 mmol/L (ref 135–145)

## 2015-03-26 NOTE — ED Notes (Signed)

## 2015-03-26 NOTE — ED Notes (Signed)
Cough.  Patient has history of aspiration pneumonia, SNF concerned about aspiration.  No fever.

## 2015-03-26 NOTE — Discharge Instructions (Signed)

## 2015-03-26 NOTE — ED Notes (Signed)
Pt caretakers present to ED with c/o cough. Caretaker states "she has always had this cough, our supervisor told us to bring her in." Pt denies chest pain, shortness of breath, or other complaints. Pt alert, skin warm and dry, respirations even and unlabored.

## 2015-03-26 NOTE — ED Provider Notes (Signed)
[facility] Emergency Department Provider Note  ____________________________________________    I have reviewed the triage vital signs and the nursing notes.   HISTORY  Chief Complaint Cough    HPI [name] [name] is a 55 y.o. female with a history of a traumatic brain injury who presents with her caregivers. Caregivers report patient has a chronic cough but they note they were instructed to bring the patient in today to make sure that she didn't aspirate because it is seemed like her cough has been more frequent today. No fevers reported. No evidence of shortness of breath.. Per caregivers patient is at her baseline    Past Medical History  Diagnosis Date  . Traumatic brain injury ([place])   . Hypertension   . Diabetes mellitus without complication ([place])   . Osteoporosis   . Arthritis   . 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[place])   . Borderline intellectual functioning   . Double vision   . 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     Patient Active Problem List   Diagnosis Date Noted  . Type 2 diabetes mellitus ([place]) 12/03/2014  . Traumatic brain injury ([place]) 12/03/2014  . Dehydration 08/23/2014  . Chronic constipation 08/23/2014  . Clinical depression 08/23/2014  . Diabetes mellitus, type 2 ([place]) 08/23/2014  . BP (high blood pressure) 08/23/2014  . Injury brain, traumatic ([place]) 08/23/2014  . OP (osteoporosis) 08/23/2014  . HLD (hyperlipidemia) 08/23/2014  . Chest pain syndrome 08/23/2014  . Cognitive impairment 08/23/2014    History reviewed. No pertinent past surgical history.  Current Outpatient Rx  Name  Route  Sig  Dispense  Refill  . alendronate (FOSAMAX) 70 MG tablet      1 TAB PO WEEKLY EARLY AM BEFORE FOOD/MEDS W/WATER. DON'T LIE DOWN FOR30 MIN (OSTEOPOROSIS) *NO CRUSH*   4 tablet   0   . ALPRAZolam (XANAX) 0.5 MG tablet   Oral   Take 0.5 mg by mouth 2 (two) times daily as needed (agitation).         . carboxymethylcellulose (REFRESH PLUS) 0.5 %  SOLN   Both Eyes   Place 1 drop into both eyes 3 (three) times daily as needed (dry eyes).         . cilostazol (PLETAL) 50 MG tablet   Oral   Take 1 tablet (50 mg total) by mouth 2 (two) times daily.   60 tablet   5   . ciprofloxacin (CIPRO) 500 MG tablet   Oral   Take 1 tablet (500 mg total) by mouth 2 (two) times daily.   6 tablet   0   . desmopressin (DDAVP) 0.2 MG tablet   Oral   Take 1 tablet (200 mcg total) by mouth at bedtime.   30 tablet   5   . docusate sodium (COLACE) 100 MG capsule   Oral   Take 1 capsule (100 mg total) by mouth 2 (two) times daily.   10 capsule   0   . donepezil (ARICEPT) 10 MG tablet      TAKE 1 TABLET BY MOUTH TWICE A DAY (DEMENTIA)   60 tablet   0   . ferrous sulfate 325 (65 FE) MG tablet   Oral   Take 325 mg by mouth daily.         Marland Kitchen FLUoxetine (PROZAC) 10 MG tablet   Oral   Take 30 mg by mouth every morning.         Marland Kitchen glucose blood test strip               .  Lurasidone HCl 20 MG TABS   Oral   Take 1 tablet by mouth every morning.         . memantine (NAMENDA XR) 28 MG CP24 24 hr capsule   Oral   Take 28 mg by mouth daily.         . nortriptyline (PAMELOR) 25 MG capsule   Oral   Take 1 capsule (25 mg total) by mouth at bedtime.   30 capsule   0   . nystatin cream (MYCOSTATIN)   Topical   Apply topically as needed for dry skin.   120 g   0   . omeprazole (PRILOSEC) 20 MG capsule   Oral   Take 1 capsule (20 mg total) by mouth daily.   30 capsule   5   . polyethylene glycol powder (GLYCOLAX/MIRALAX) powder   Oral   Take 255 g by mouth once.   3350 g   1     Take for 3 days   . Probiotic Product (ADVANCED PROBIOTIC 10) CAPS   Oral   Take 1 capsule by mouth once.   30 capsule   5   . promethazine (PHENERGAN) 25 MG tablet   Oral   Take 12.5-25 mg by mouth every 6 (six) hours as needed for nausea.         . ramipril (ALTACE) 10 MG capsule   Oral   Take 1 capsule (10 mg total) by mouth  daily.   90 capsule   3   . simvastatin (ZOCOR) 10 MG tablet   Oral   Take 1 tablet (10 mg total) by mouth at bedtime.   30 tablet   5   . sitaGLIPtin (JANUVIA) 50 MG tablet      ONE TABLET BY MOUTH ONCE A DAY. (DIABETES)   30 tablet   0   . tolterodine (DETROL LA) 4 MG 24 hr capsule      TAKE 1 CAPSULE BY MOUTH ONCE DAILY FOR BLADDER. **DO NOT CRUSH**   30 capsule   0     Allergies Dilantin; Penicillins; Phenytoin sodium extended; and Sulfa antibiotics  No family history on file.  Social History Social History  Substance Use Topics  . Smoking status: Former Research scientist (life sciences)  . Smokeless tobacco: Never Used  . Alcohol Use: No    Review of Systems per caregivers,  Constitutional: Negative for fever. Eyes: Negative for discharge   Respiratory: Negative for shortness of breath. Positive for cough, chronic Gastrointestinal: Negative for vomiting and diarrhea. Genitourinary: Negative for foul smelling urine Musculoskeletal: Negative for joint swelling Skin: Negative for rash.  Psychiatric: At baseline    ____________________________________________   PHYSICAL EXAM:  VITAL SIGNS: ED Triage Vitals  Enc Vitals Group     BP 03/26/15 1837 116/76 mmHg     Pulse Rate 03/26/15 1837 93     Resp 03/26/15 1837 18     Temp 03/26/15 1837 98.8 F (37.1 C)     Temp Source 03/26/15 1837 Oral     SpO2 03/26/15 1837 91 %     Weight 03/26/15 1837 200 lb (90.719 kg)     Height 03/26/15 1837 5\' 6"  (1.676 m)     Head Cir --      Peak Flow --      Pain Score 03/26/15 1840 0     Pain Loc --      Pain Edu? --      Excl. in [place]? --  Constitutional:  Well appearing and in no distress. Eyes: Conjunctivae are normal.  ENT   Head: Normocephalic and atraumatic.   Mouth/Throat: Mucous membranes are moist. Cardiovascular: Normal rate, regular rhythm. Normal and symmetric distal pulses are present in all extremities.  Respiratory: Normal respiratory effort without  tachypnea nor retractions. Breath sounds are clear and equal bilaterally.  Gastrointestinal: Soft and non-tender in all quadrants. No distention.  Genitourinary: deferred   Skin:  Skin is warm, dry and intact. No rash noted.   ____________________________________________    LABS (pertinent positives/negatives)  Labs Reviewed  CBC WITH DIFFERENTIAL/PLATELET - Abnormal; Notable for the following:    Monocytes Absolute 1.0 (*)    All other components within normal limits  BASIC METABOLIC PANEL - Abnormal; Notable for the following:    Glucose, Bld 102 (*)    All other components within normal limits    ____________________________________________   EKG  None  ____________________________________________    RADIOLOGY I have personally reviewed any xrays that were ordered on this patient: Chest x-ray unremarkable  ____________________________________________   PROCEDURES  Procedure(s) performed: none  Critical Care performed: none  ____________________________________________   INITIAL IMPRESSION / ASSESSMENT AND PLAN / ED COURSE  Pertinent labs & imaging results that were available during my care of the patient were reviewed by me and considered in my medical decision making (see chart for details).  Patient well-appearing and in no distress. Vital signs are at her baseline. Chest x-ray is clear. Lung Exam is normal. Lab work is reassuring. No intervention required at this time  ____________________________________________   FINAL CLINICAL IMPRESSION(S) / ED DIAGNOSES  Final diagnoses:  Chronic cough     Lavonia Drafts, MD 03/26/15 2225

## 2015-04-04 ENCOUNTER — Other Ambulatory Visit: Payer: Self-pay | Admitting: Family Medicine

## 2015-04-04 DIAGNOSIS — R921 Mammographic calcification found on diagnostic imaging of breast: Secondary | ICD-10-CM

## 2015-04-20 ENCOUNTER — Encounter: Payer: Self-pay | Admitting: Emergency Medicine

## 2015-04-20 ENCOUNTER — Emergency Department
Admission: EM | Admit: 2015-04-20 | Discharge: 2015-04-20 | Disposition: A | Discharge status: Home / Self Care | Payer: Medicare Other | Attending: Emergency Medicine | Admitting: Emergency Medicine

## 2015-04-20 ENCOUNTER — Emergency Department: Payer: Medicare Other

## 2015-04-20 DIAGNOSIS — Z7984 Long term (current) use of oral hypoglycemic drugs: Secondary | ICD-10-CM | POA: Insufficient documentation

## 2015-04-20 DIAGNOSIS — I10 Essential (primary) hypertension: Secondary | ICD-10-CM | POA: Diagnosis not present

## 2015-04-20 DIAGNOSIS — R05 Cough: Secondary | ICD-10-CM | POA: Diagnosis not present

## 2015-04-20 DIAGNOSIS — Z87820 Personal history of traumatic brain injury: Secondary | ICD-10-CM | POA: Diagnosis not present

## 2015-04-20 DIAGNOSIS — M1990 Unspecified osteoarthritis, unspecified site: Secondary | ICD-10-CM | POA: Insufficient documentation

## 2015-04-20 DIAGNOSIS — Z792 Long term (current) use of antibiotics: Secondary | ICD-10-CM | POA: Insufficient documentation

## 2015-04-20 DIAGNOSIS — Z79899 Other long term (current) drug therapy: Secondary | ICD-10-CM | POA: Diagnosis not present

## 2015-04-20 DIAGNOSIS — R053 Chronic cough: Secondary | ICD-10-CM

## 2015-04-20 DIAGNOSIS — E119 Type 2 diabetes mellitus without complications: Secondary | ICD-10-CM | POA: Diagnosis not present

## 2015-04-20 DIAGNOSIS — M810 Age-related osteoporosis without current pathological fracture: Secondary | ICD-10-CM | POA: Diagnosis not present

## 2015-04-20 DIAGNOSIS — Z87891 Personal history of nicotine dependence: Secondary | ICD-10-CM | POA: Diagnosis not present

## 2015-04-20 LAB — CBC WITH DIFFERENTIAL/PLATELET
Basophils Absolute: 0 10*3/uL (ref 0–0.1)
Basophils Relative: 1 %
EOS ABS: 0.1 10*3/uL (ref 0–0.7)
EOS PCT: 2 %
HCT: 41.2 % (ref 35.0–47.0)
HEMOGLOBIN: 14 g/dL (ref 12.0–16.0)
LYMPHS ABS: 0.9 10*3/uL — AB (ref 1.0–3.6)
Lymphocytes Relative: 11 %
MCH: 29.5 pg (ref 26.0–34.0)
MCHC: 33.8 g/dL (ref 32.0–36.0)
MCV: 87.2 fL (ref 80.0–100.0)
Monocytes Absolute: 0.9 10*3/uL (ref 0.2–0.9)
Monocytes Relative: 12 %
NEUTROS ABS: 5.8 10*3/uL (ref 1.4–6.5)
Neutrophils Relative %: 74 %
Platelets: 220 10*3/uL (ref 150–440)
RBC: 4.73 MIL/uL (ref 3.80–5.20)
RDW: 13.3 % (ref 11.5–14.5)
WBC: 7.8 10*3/uL (ref 3.6–11.0)

## 2015-04-20 NOTE — ED Provider Notes (Signed)
[facility] Emergency Department Provider Note  ____________________________________________  Time seen: Approximately 12:57 PM  I have reviewed the triage vital signs and the nursing notes.   HISTORY  Chief Complaint Cough    HPI [name] [name] is a 55 y.o. female , NAD, presents to the emergency department accompanied by a caregiver who gives the history. Patient has history of traumatic brain injury and is under the care of [facility]. Her caregiver notes that the patient has a chronic cough but was told when she arrived on shift this morning that the overnight staff felt the cough had change. Patient has a history of aspiration pneumonia and it was suggested by an on call physician to report to the emergency department for chest x-ray to rule out such. Caregiver notes the patient has had no change in demeanor has not had any fevers, chills or body aches. Patient has had some nasal congestion which can be common for her during this season.   Past Medical History  Diagnosis Date  . Traumatic brain injury ([place])   . Hypertension   . Diabetes mellitus without complication ([place])   . Osteoporosis   . Arthritis   . 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[place])   . Borderline intellectual functioning   . Double vision   . 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     Patient Active Problem List   Diagnosis Date Noted  . Type 2 diabetes mellitus ([place]) 12/03/2014  . Traumatic brain injury ([place]) 12/03/2014  . Dehydration 08/23/2014  . Chronic constipation 08/23/2014  . Clinical depression 08/23/2014  . Diabetes mellitus, type 2 ([place]) 08/23/2014  . BP (high blood pressure) 08/23/2014  . Injury brain, traumatic ([place]) 08/23/2014  . OP (osteoporosis) 08/23/2014  . HLD (hyperlipidemia) 08/23/2014  . Chest pain syndrome 08/23/2014  . Cognitive impairment 08/23/2014    History reviewed. No pertinent past surgical history.  Current Outpatient Rx  Name  Route  Sig  Dispense   Refill  . alendronate (FOSAMAX) 70 MG tablet      1 TAB PO WEEKLY EARLY AM BEFORE FOOD/MEDS W/WATER. DON'T LIE DOWN FOR30 MIN (OSTEOPOROSIS) *NO CRUSH*   4 tablet   0   . ALPRAZolam (XANAX) 0.5 MG tablet   Oral   Take 0.5 mg by mouth 2 (two) times daily as needed (agitation).         . carboxymethylcellulose (REFRESH PLUS) 0.5 % SOLN   Both Eyes   Place 1 drop into both eyes 3 (three) times daily as needed (dry eyes).         . cilostazol (PLETAL) 50 MG tablet   Oral   Take 1 tablet (50 mg total) by mouth 2 (two) times daily.   60 tablet   5   . ciprofloxacin (CIPRO) 500 MG tablet   Oral   Take 1 tablet (500 mg total) by mouth 2 (two) times daily.   6 tablet   0   . desmopressin (DDAVP) 0.2 MG tablet   Oral   Take 1 tablet (200 mcg total) by mouth at bedtime.   30 tablet   5   . docusate sodium (COLACE) 100 MG capsule   Oral   Take 1 capsule (100 mg total) by mouth 2 (two) times daily.   10 capsule   0   . donepezil (ARICEPT) 10 MG tablet      TAKE 1 TABLET BY MOUTH TWICE A DAY (DEMENTIA)   60 tablet   0   .  ferrous sulfate 325 (65 FE) MG tablet   Oral   Take 325 mg by mouth daily.         Marland Kitchen FLUoxetine (PROZAC) 10 MG tablet   Oral   Take 30 mg by mouth every morning.         Marland Kitchen glucose blood test strip               . Lurasidone HCl 20 MG TABS   Oral   Take 1 tablet by mouth every morning.         . memantine (NAMENDA XR) 28 MG CP24 24 hr capsule   Oral   Take 28 mg by mouth daily.         . nortriptyline (PAMELOR) 25 MG capsule   Oral   Take 1 capsule (25 mg total) by mouth at bedtime.   30 capsule   0   . nystatin cream (MYCOSTATIN)   Topical   Apply topically as needed for dry skin.   120 g   0   . omeprazole (PRILOSEC) 20 MG capsule   Oral   Take 1 capsule (20 mg total) by mouth daily.   30 capsule   5   . polyethylene glycol powder (GLYCOLAX/MIRALAX) powder   Oral   Take 255 g by mouth once.   3350 g   1      Take for 3 days   . Probiotic Product (ADVANCED PROBIOTIC 10) CAPS   Oral   Take 1 capsule by mouth once.   30 capsule   5   . promethazine (PHENERGAN) 25 MG tablet   Oral   Take 12.5-25 mg by mouth every 6 (six) hours as needed for nausea.         . ramipril (ALTACE) 10 MG capsule   Oral   Take 1 capsule (10 mg total) by mouth daily.   90 capsule   3   . simvastatin (ZOCOR) 10 MG tablet   Oral   Take 1 tablet (10 mg total) by mouth at bedtime.   30 tablet   5   . sitaGLIPtin (JANUVIA) 50 MG tablet      ONE TABLET BY MOUTH ONCE A DAY. (DIABETES)   30 tablet   0   . tolterodine (DETROL LA) 4 MG 24 hr capsule      TAKE 1 CAPSULE BY MOUTH ONCE DAILY FOR BLADDER. **DO NOT CRUSH**   30 capsule   0     Allergies Dilantin; Penicillins; Phenytoin sodium extended; and Sulfa antibiotics  No family history on file.  Social History Social History  Substance Use Topics  . Smoking status: Former Research scientist (life sciences)  . Smokeless tobacco: Never Used  . Alcohol Use: No     Review of Systems  Constitutional: No fever/chills, fatigue Eyes: No visual changes. No discharge ENT: Positive nasal congestion. No sore throat, ear pain. Cardiovascular: No chest pain. Respiratory: Positive cough. No shortness of breath. No wheezing.  Musculoskeletal: Negative for general myalgias.  Skin: Negative for rash. Neurological: Negative for headaches, focal weakness or numbness. 10-point ROS otherwise negative.  ____________________________________________   PHYSICAL EXAM:  VITAL SIGNS: ED Triage Vitals  Enc Vitals Group     BP 04/20/15 1154 113/76 mmHg     Pulse Rate 04/20/15 1154 100     Resp 04/20/15 1154 18     Temp 04/20/15 1154 97 F (36.1 C)     Temp Source 04/20/15 1154 Oral     SpO2 04/20/15  1154 98 %     Weight 04/20/15 1154 189 lb (85.73 kg)     Height 04/20/15 1154 5\' 7"  (1.702 m)     Head Cir --      Peak Flow --      Pain Score 04/20/15 1255 0     Pain Loc --       Pain Edu? --      Excl. in [place]? --     Constitutional: Alert and oriented. Well appearing and in no acute distress. Eyes: Conjunctivae are normal.  Head: Atraumatic. ENT:      Nose: No congestion/rhinnorhea.      Mouth/Throat: Mucous membranes are moist.  Neck: No stridor.  Hematological/Lymphatic/Immunilogical: No cervical lymphadenopathy. Cardiovascular: Normal rate, regular rhythm. Normal S1 and S2.  Good peripheral circulation. Respiratory: Normal respiratory effort without tachypnea or retractions. Lungs CTAB with breath sounds heard in all fields. Neurologic:  Normal speech and language. No gross focal neurologic deficits are appreciated.  Skin:  Skin is warm, dry and intact. No rash noted. Psychiatric: Mood and affect are normal. Speech and behavior are at baseline.   ____________________________________________   LABS (all labs ordered are listed, but only abnormal results are displayed)  Labs Reviewed  CBC WITH DIFFERENTIAL/PLATELET - Abnormal; Notable for the following:    Lymphs Abs 0.9 (*)    All other components within normal limits   ____________________________________________  EKG  None ____________________________________________  RADIOLOGY I have personally viewed and evaluated these images (plain radiographs) as part of my medical decision making, as well as reviewing the written report by the radiologist.  Dg Chest 2 View  04/20/2015  CLINICAL DATA:  55 year old female with a history of cough and sniffles. EXAM: CHEST - 2 VIEW COMPARISON:  03/26/2015 FINDINGS: Cardiomediastinal silhouette unchanged. No evidence of pulmonary vascular congestion. No confluent airspace disease, pneumothorax, or pleural effusion. No displaced fracture. Degenerative changes of the shoulders. Cholecystectomy. IMPRESSION: Chronic lung changes with no evidence of superimposed acute cardiopulmonary disease. Signed, Dulcy Fanny. Earleen Newport, DO [facility]  [facility] Radiology Electronically Signed   By: Corrie Mckusick D.O.   On: 04/20/2015 12:27    ____________________________________________    PROCEDURES  Procedure(s) performed: None    Medications - No data to display   ____________________________________________   INITIAL IMPRESSION / ASSESSMENT AND PLAN / ED COURSE  Pertinent labs & imaging results that were available during my care of the patient were reviewed by me and considered in my medical decision making (see chart for details).  Patient's diagnosis is consistent with chronic cough with no evidence of infection or aspiration at this time. Patient will be discharged home with instructions for home care givers to continue to monitor for any other changes.  Patient is to follow up with her primary care provider if symptoms persist past this treatment course. Patient is given ED precautions to return to the ED for any worsening or new symptoms.    ____________________________________________  FINAL CLINICAL IMPRESSION(S) / ED DIAGNOSES  Final diagnoses:  Chronic cough      NEW MEDICATIONS STARTED DURING THIS VISIT:  Discharge Medication List as of 04/20/2015 12:51 PM           Braxton Feathers, PA-C 04/20/15 1340  Schuyler Amor, MD 04/20/15 1553

## 2015-04-20 NOTE — ED Notes (Signed)
AAOx3.  Skin warm and dry. NAD.  No SOB/ DOE. 

## 2015-04-20 NOTE — ED Notes (Signed)
Pt here from group home with c/o cough and "sniffles." There was some concern this am that pt may have aspirated. Pt does not remember choking on anything at breakfast, group home worker with patient does not know of any aspiration event, and states her cough sounds the same as it always has. Pt has sinus congestion in triage, foul smelling breath. No respiratory distress noted.

## 2015-04-20 NOTE — Discharge Instructions (Signed)

## 2015-05-01 ENCOUNTER — Other Ambulatory Visit: Payer: Medicare Other

## 2015-05-01 ENCOUNTER — Ambulatory Visit: Payer: Medicare Other

## 2015-05-14 ENCOUNTER — Other Ambulatory Visit: Payer: Self-pay

## 2015-05-14 MED ORDER — TOLTERODINE TARTRATE ER 4 MG PO CP24: ORAL_CAPSULE | ORAL | Status: DC

## 2015-05-14 MED ORDER — DONEPEZIL HCL 10 MG PO TABS: ORAL_TABLET | ORAL | Status: DC

## 2015-05-21 ENCOUNTER — Other Ambulatory Visit: Payer: Medicare Other

## 2015-05-21 ENCOUNTER — Ambulatory Visit: Payer: Medicare Other

## 2015-05-28 DIAGNOSIS — F339 Major depressive disorder, recurrent, unspecified: Secondary | ICD-10-CM | POA: Diagnosis not present

## 2015-06-03 ENCOUNTER — Other Ambulatory Visit: Payer: Medicare Other

## 2015-06-03 ENCOUNTER — Ambulatory Visit: Payer: Medicare Other

## 2015-06-12 ENCOUNTER — Ambulatory Visit
Admission: RE | Admit: 2015-06-12 | Discharge: 2015-06-12 | Disposition: A | Discharge status: Home / Self Care | Payer: Medicare Other | Source: Ambulatory Visit | Attending: Family Medicine | Admitting: Family Medicine

## 2015-06-12 DIAGNOSIS — R928 Other abnormal and inconclusive findings on diagnostic imaging of breast: Secondary | ICD-10-CM | POA: Insufficient documentation

## 2015-06-12 DIAGNOSIS — R921 Mammographic calcification found on diagnostic imaging of breast: Secondary | ICD-10-CM

## 2015-06-13 ENCOUNTER — Telehealth: Payer: Self-pay | Admitting: Family Medicine

## 2015-06-13 NOTE — Telephone Encounter (Signed)
[name] [name] from [facility] requesting refill on Detrol 4mg , Prilosec 20mg  and Donepezil

## 2015-06-14 ENCOUNTER — Ambulatory Visit: Payer: Medicare Other | Admitting: Sports Medicine

## 2015-06-19 ENCOUNTER — Ambulatory Visit: Payer: Medicare Other | Admitting: Family Medicine

## 2015-06-21 ENCOUNTER — Other Ambulatory Visit: Payer: Self-pay | Admitting: Emergency Medicine

## 2015-06-21 MED ORDER — OMEPRAZOLE 20 MG PO CPDR: 20.0000 mg | DELAYED_RELEASE_CAPSULE | Freq: Every day | ORAL | Status: DC

## 2015-06-21 MED ORDER — TOLTERODINE TARTRATE ER 4 MG PO CP24: ORAL_CAPSULE | ORAL | Status: DC

## 2015-06-21 MED ORDER — DONEPEZIL HCL 10 MG PO TABS: ORAL_TABLET | ORAL | Status: DC

## 2015-06-25 ENCOUNTER — Encounter: Payer: Self-pay | Admitting: Sports Medicine

## 2015-06-25 ENCOUNTER — Encounter: Payer: Medicare Other | Admitting: Sports Medicine

## 2015-06-25 IMAGING — US ABDOMEN ULTRASOUND LIMITED
1 series · 14 of 25 positions shown · non-contrast
Comparison: 

REASON FOR EXAM: epigastric and ruq tenderness
COMMENTS:   Body Site: GB and Fossa, CBD, Head of Pancreas; Liver; Spleen

PROCEDURE:     US  - US ABDOMEN LIMITED SURVEY  - October 07, 2012  [DATE]
RESULT:     Right or quadrant abdominal ultrasound dated
TECHNIQUE: Real time sonographic imaging of the right quadrant was obtained.
Static representative images were provided for interpretation.

[Series 1: abdomen ultrasound limited · 0.31mm/px · 14 of 34 slices shown]
[im 1/34]
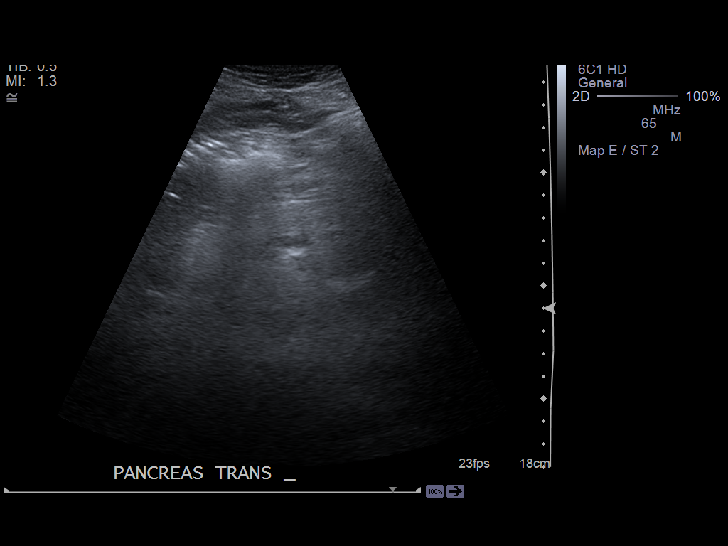
[im 3/34]
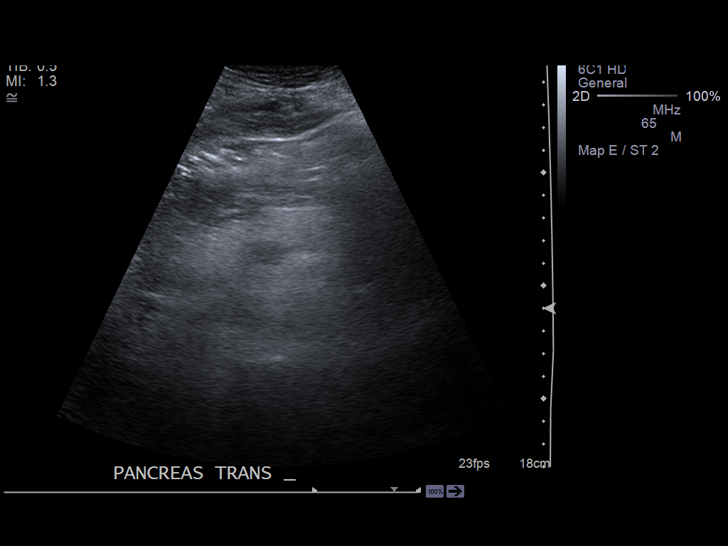
[im 6/34]
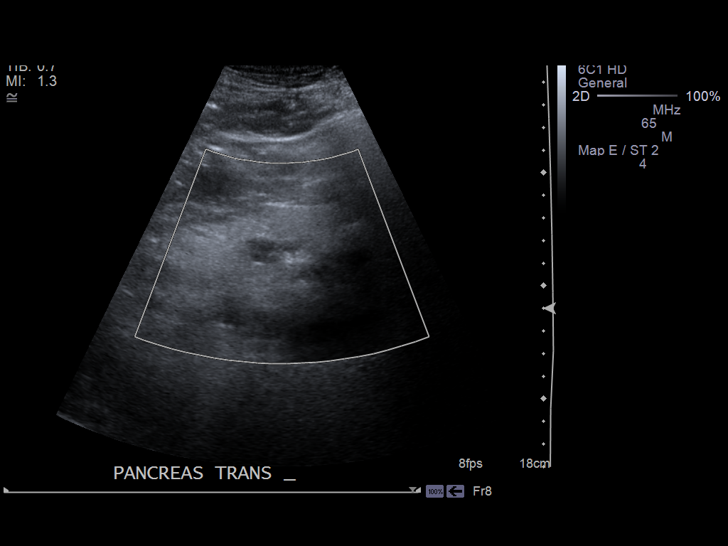
[im 9/34]
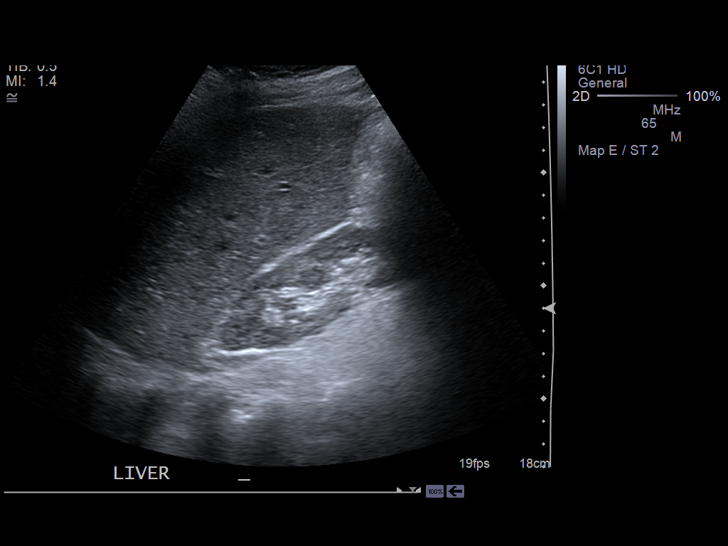
[im 12/34]
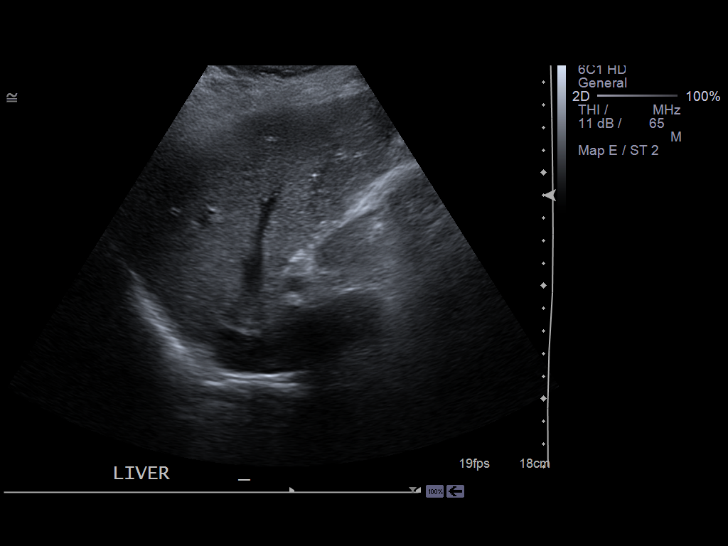
[im 13/34]
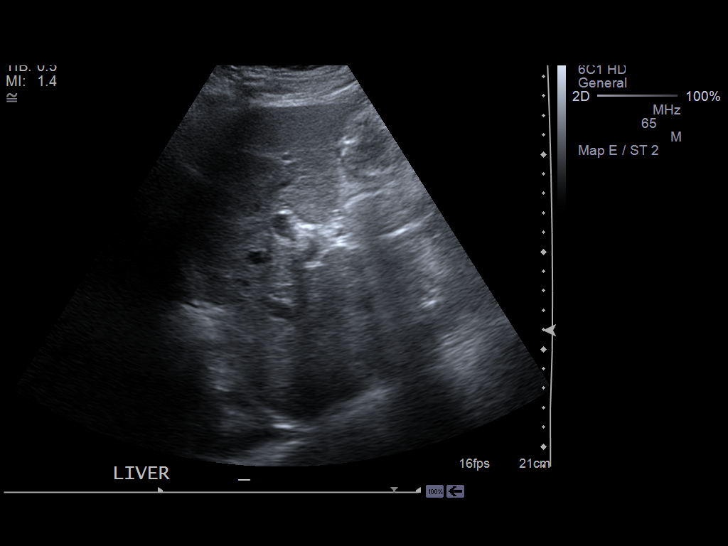
[im 16/34]
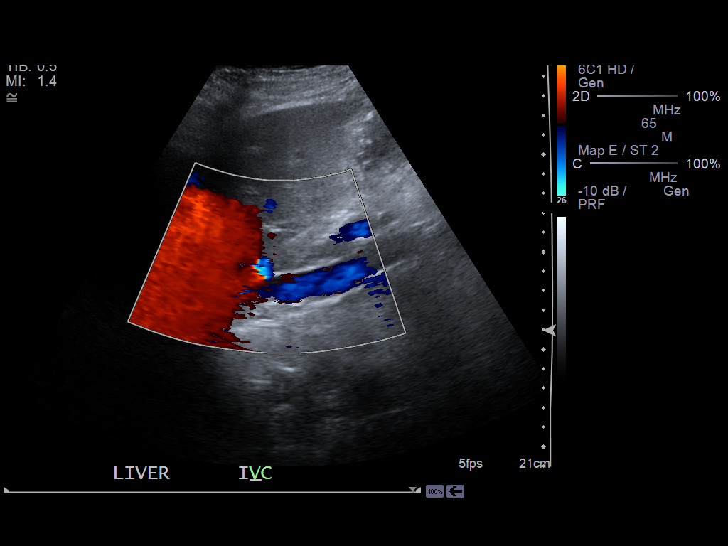
[im 18/34]
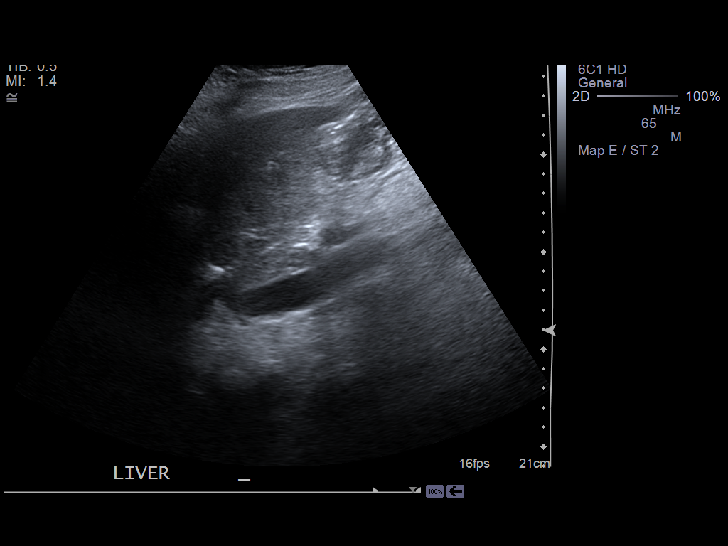
[im 21/34]
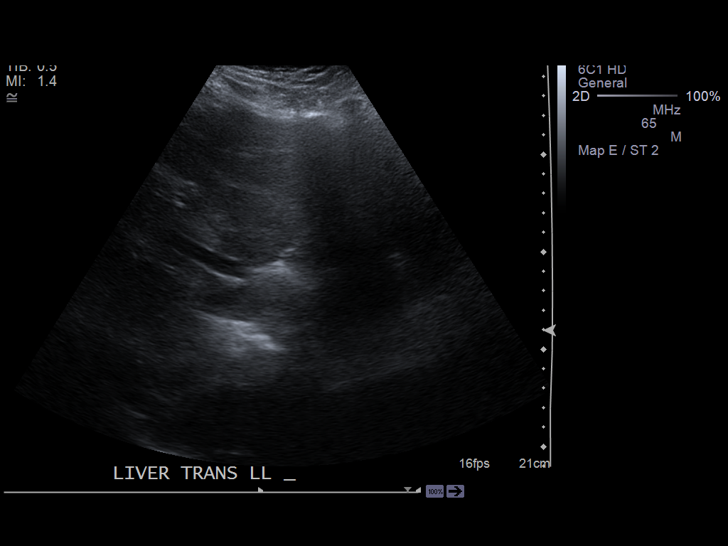
[im 23/34]
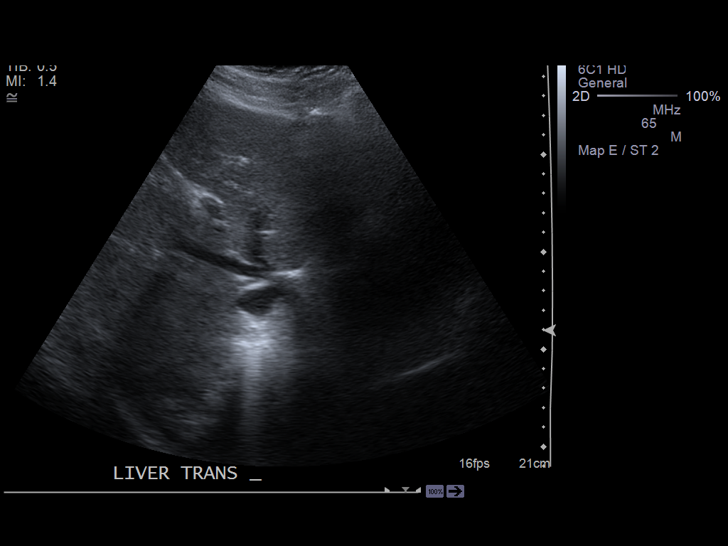
[im 25/34]
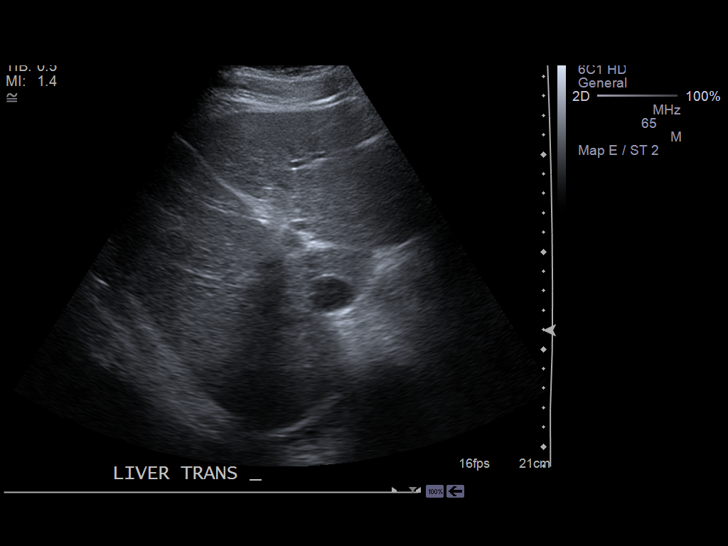
[im 28/34]
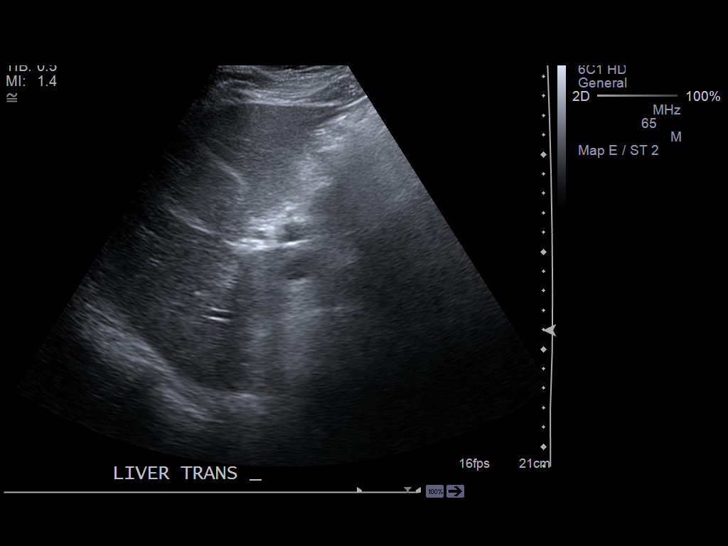
[im 31/34]
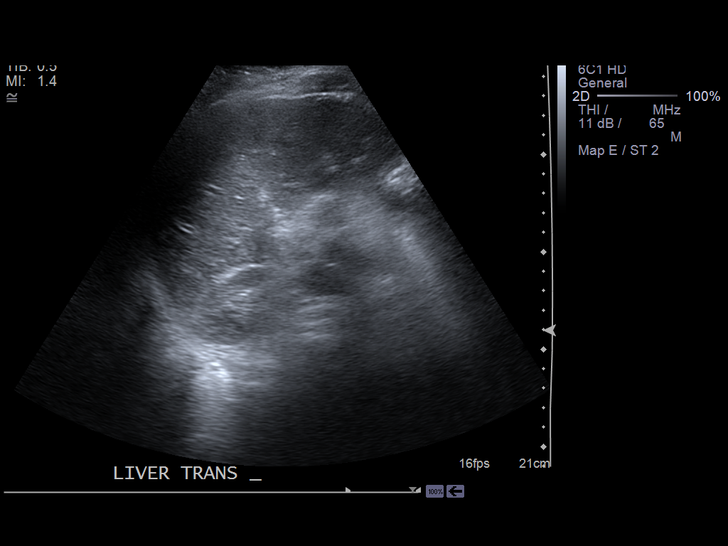
[im 34/34]
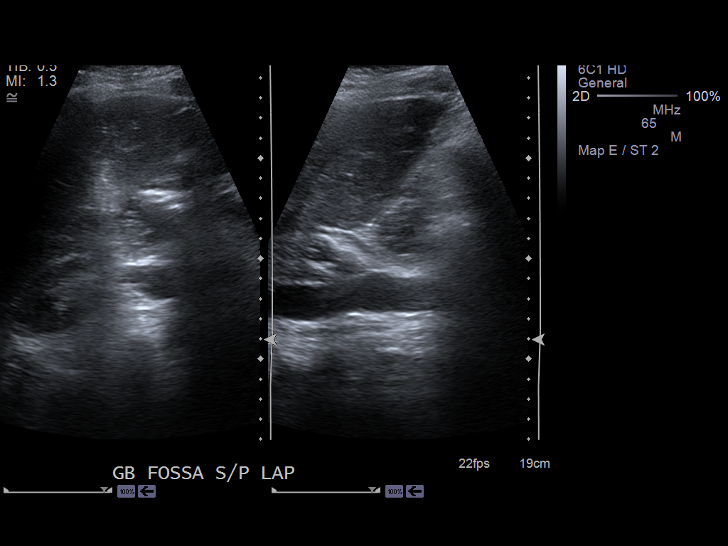

[14 of 25 positions shown; findings below may reference images not displayed]

FINDINGS: The liver is unremarkable. Hepatopedal flow is identified within
the portal vein.

Patient status post cholecystectomy. Common bile  measures 3.9 mm in
diameter. There is no evidence of free fluid or loculated fluid collections
in the gallbladder fossa. Pancreas is partially by bowel gas.
IMPRESSION: Unremarkable right or quadrant abdominal ultrasound.

## 2015-06-25 IMAGING — CR DG CHEST 1V PORT
1 series · 1 of 1 positions shown · non-contrast
Comparison: none

REASON FOR EXAM: sob,
COMMENTS:

[ap]
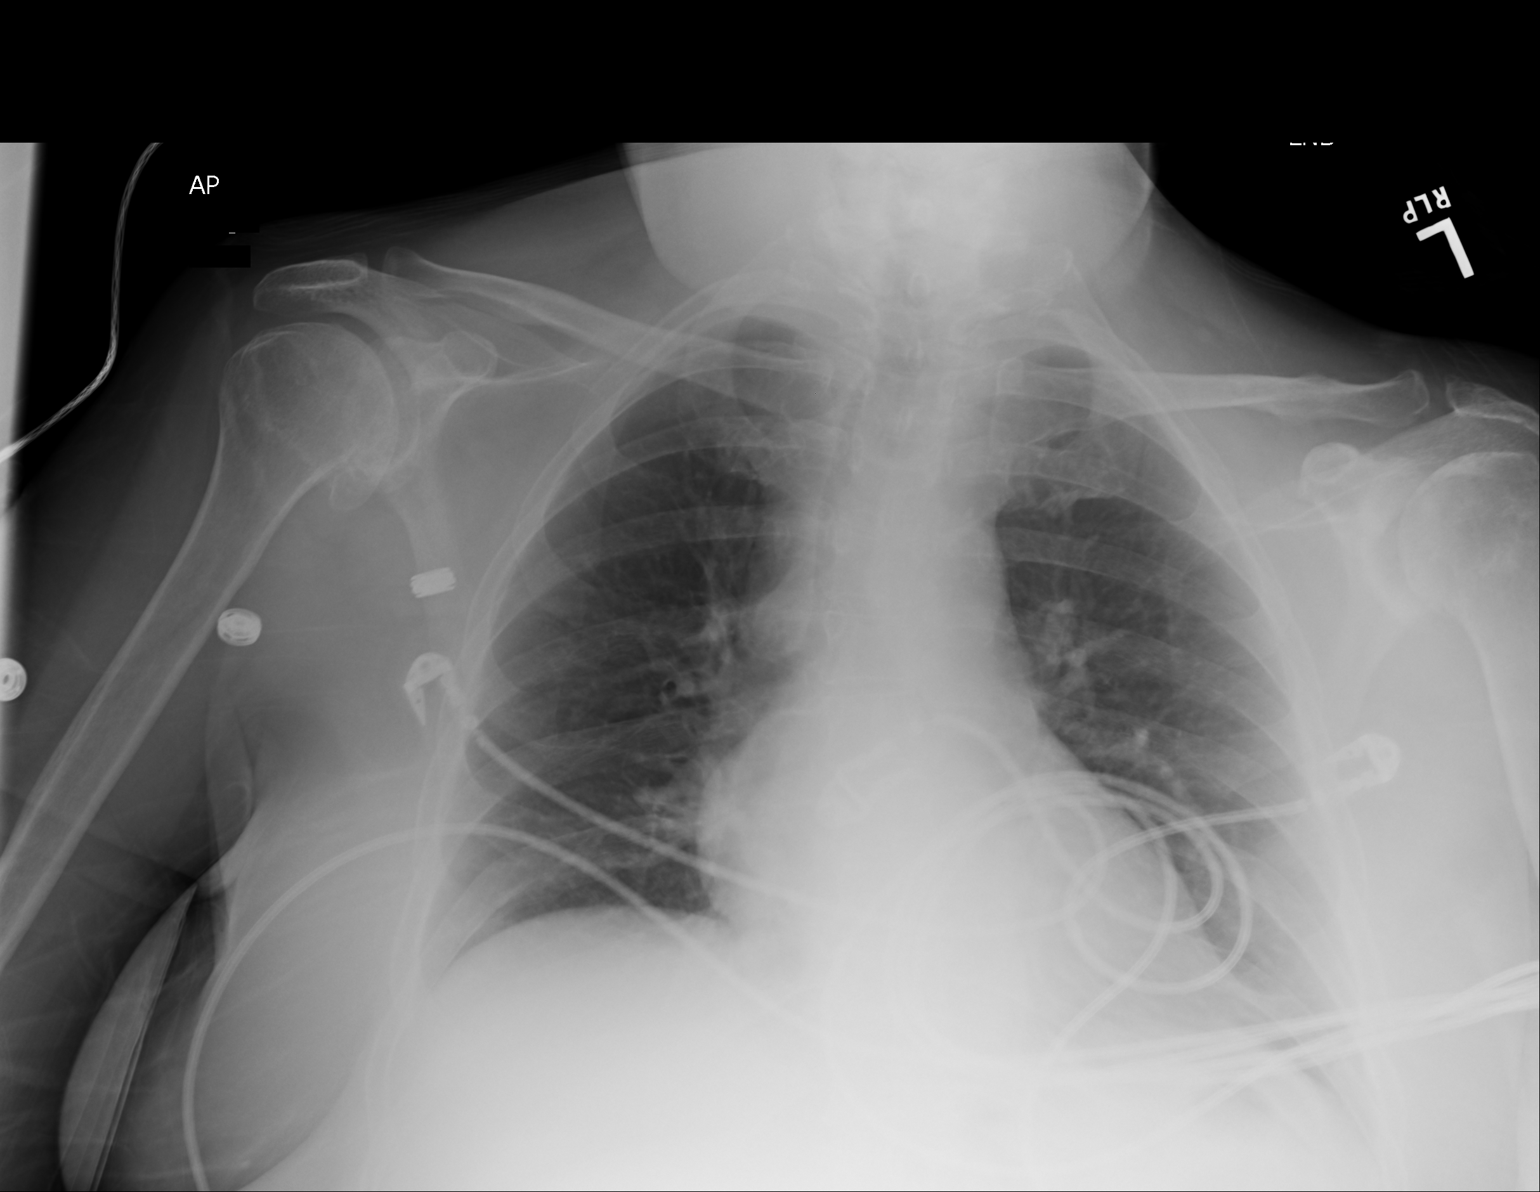

[1 of 1 positions shown; findings below may reference images not displayed]

PROCEDURE:     DXR - DXR PORTABLE CHEST SINGLE VIEW  - October 07, 2012  [DATE]

RESULT:     Comparison is made to the study October 06, 2012.

The lungs are adequately inflated. There is no focal infiltrate. The cardiac
silhouette is top normal in size. The pulmonary vascularity is not engorged.
The mediastinum is normal in width. There is no pleural effusion or
pneumothorax. There are mild degenerative changes of the right shoulder.
IMPRESSION: There is no evidence of CHF nor pneumonia nor other acute
cardiopulmonary abnormality. A followup PA and lateral chest x-ray may be
useful if the patient's dyspnea persists.

[REDACTED]

## 2015-06-25 NOTE — Progress Notes (Signed)
This encounter was created in error - please disregard.

## 2015-06-26 IMAGING — CR DG ABDOMEN 1V
1 series · 3 of 3 positions shown · non-contrast
Comparison: none

REASON FOR EXAM: Dobbhoff placement check
COMMENTS:   Bedside (portable):Y

PROCEDURE:     DXR - DXR KIDNEY URETER BLADDER  - October 08, 2012  [DATE]
RESULT:

[Series 1: ap · 0.17mm/px · 3 of 3 slices shown]
[im 1/3]
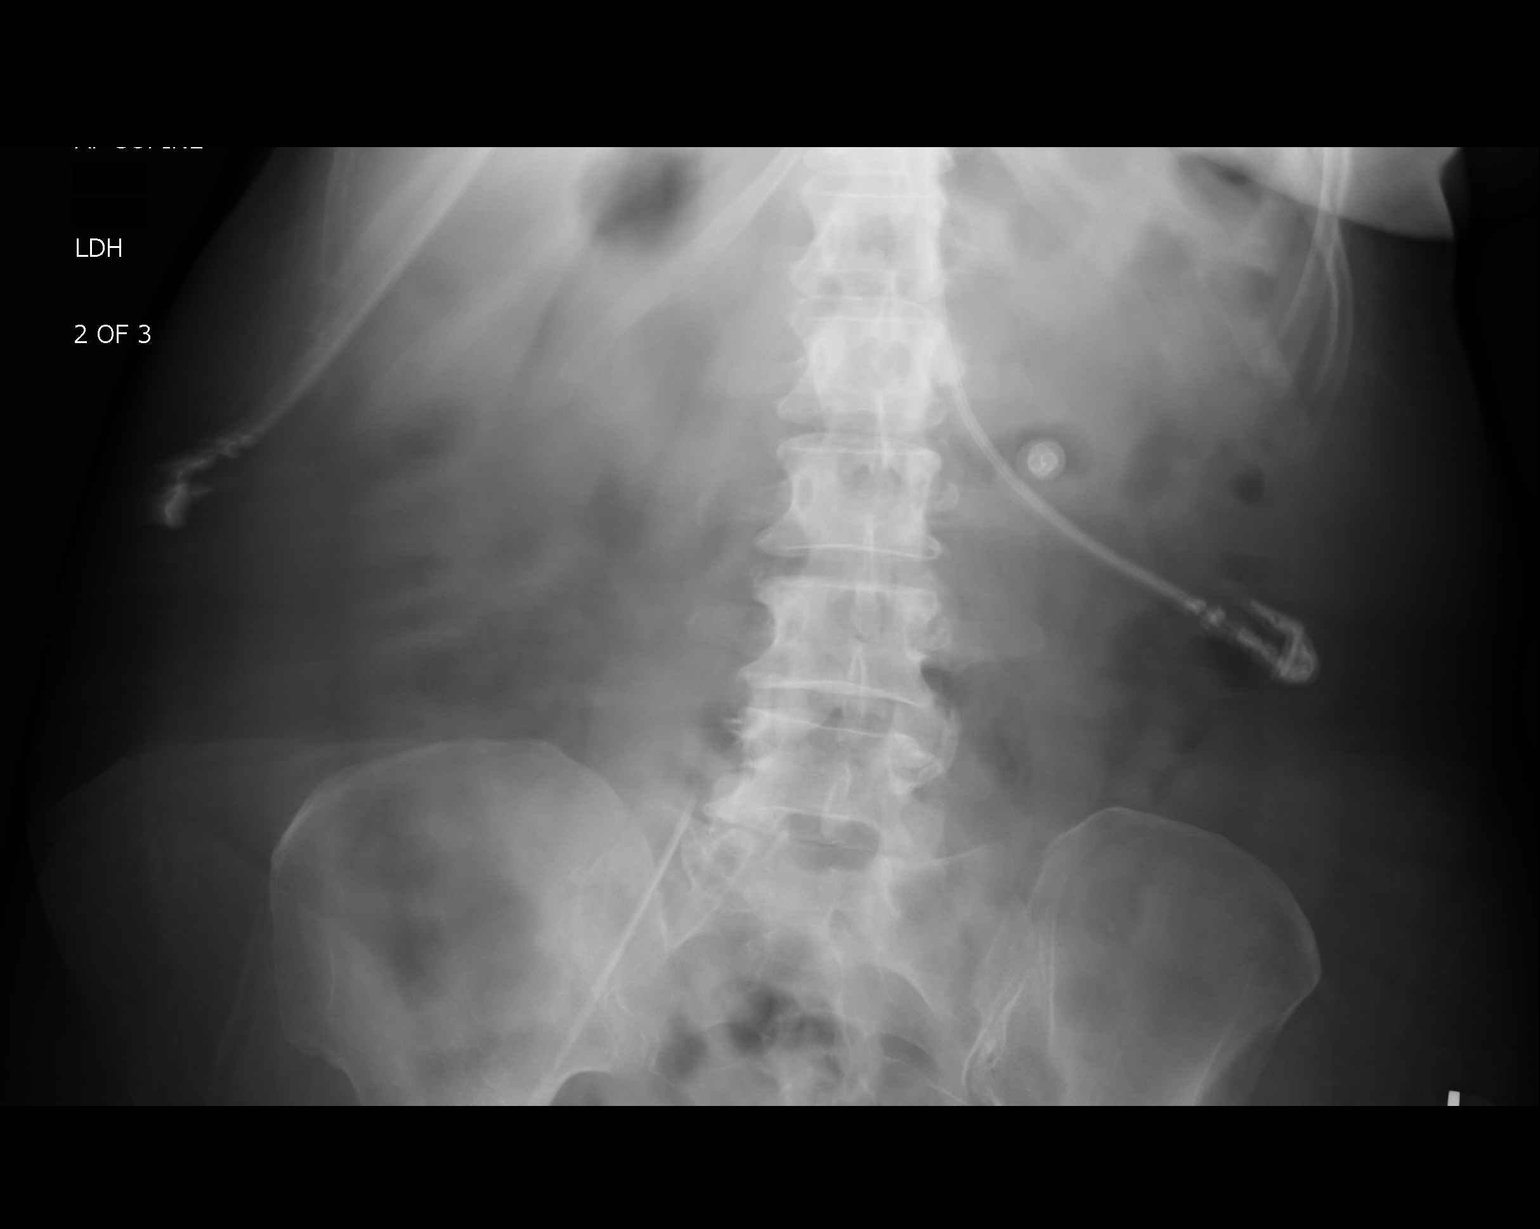
[im 2/3]
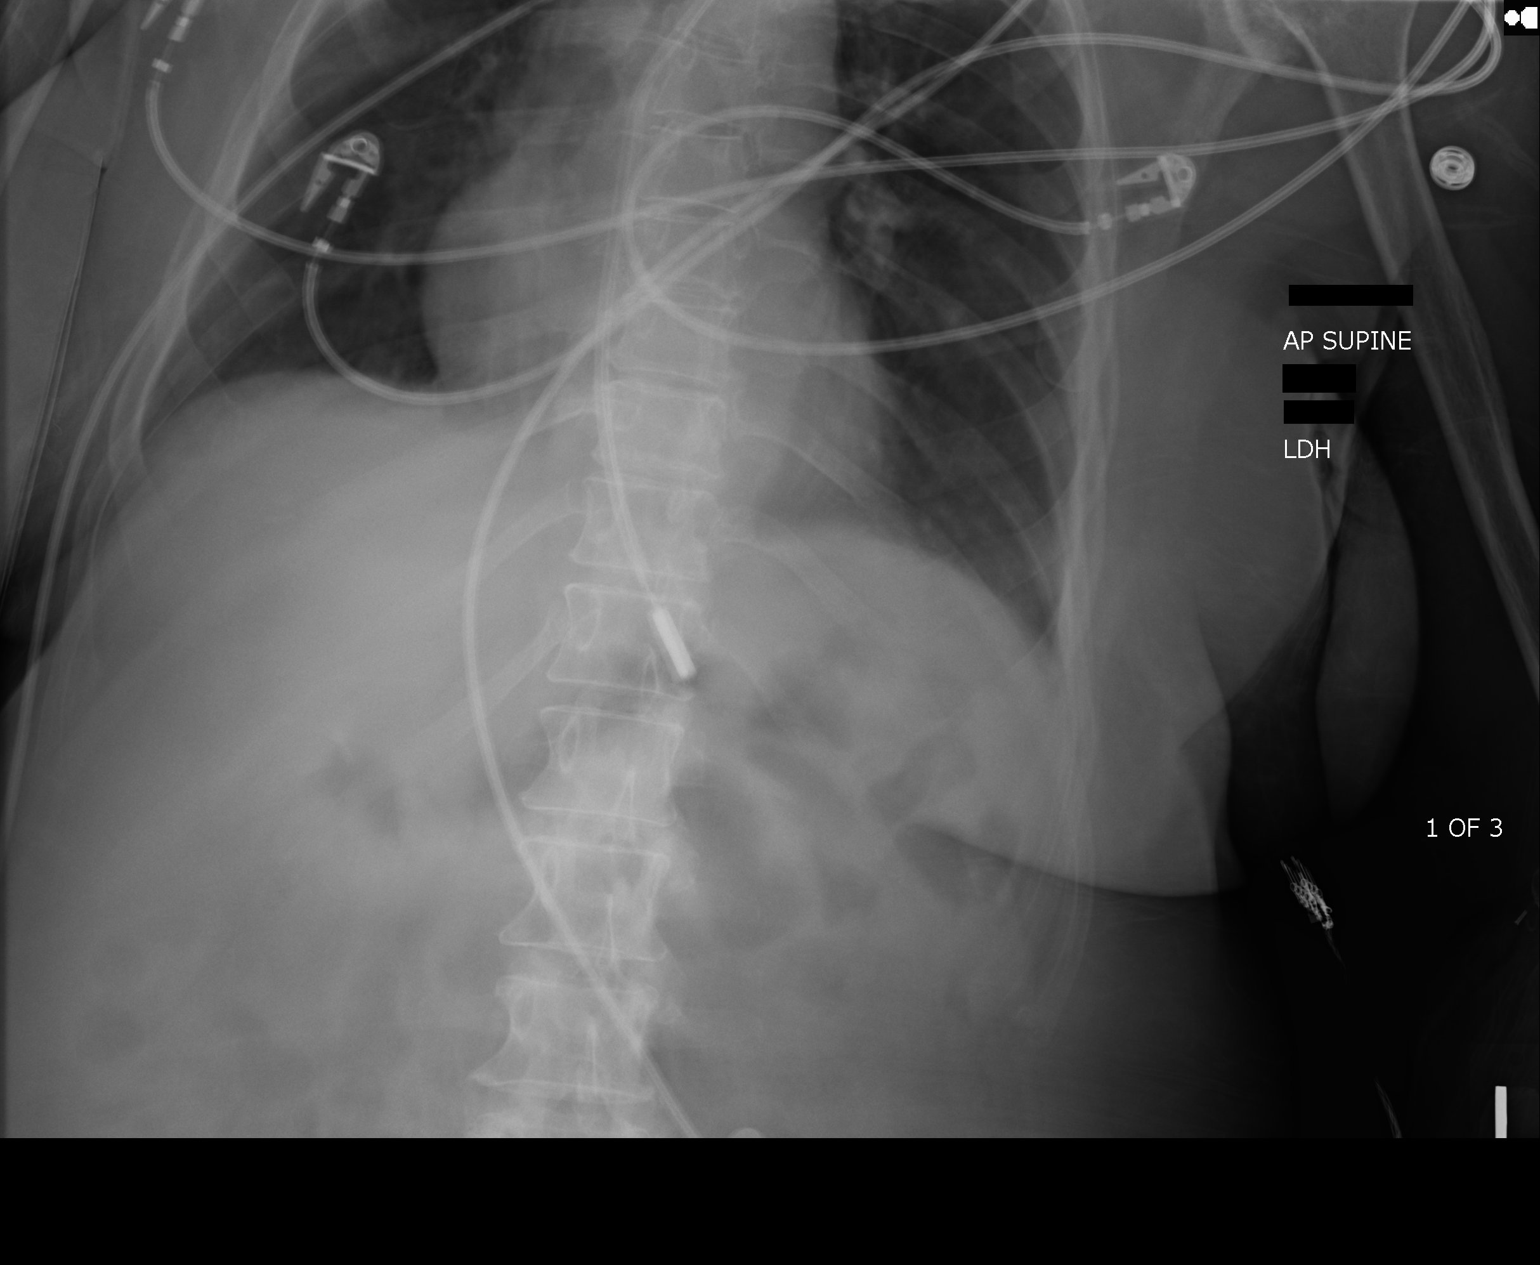
[im 3/3]
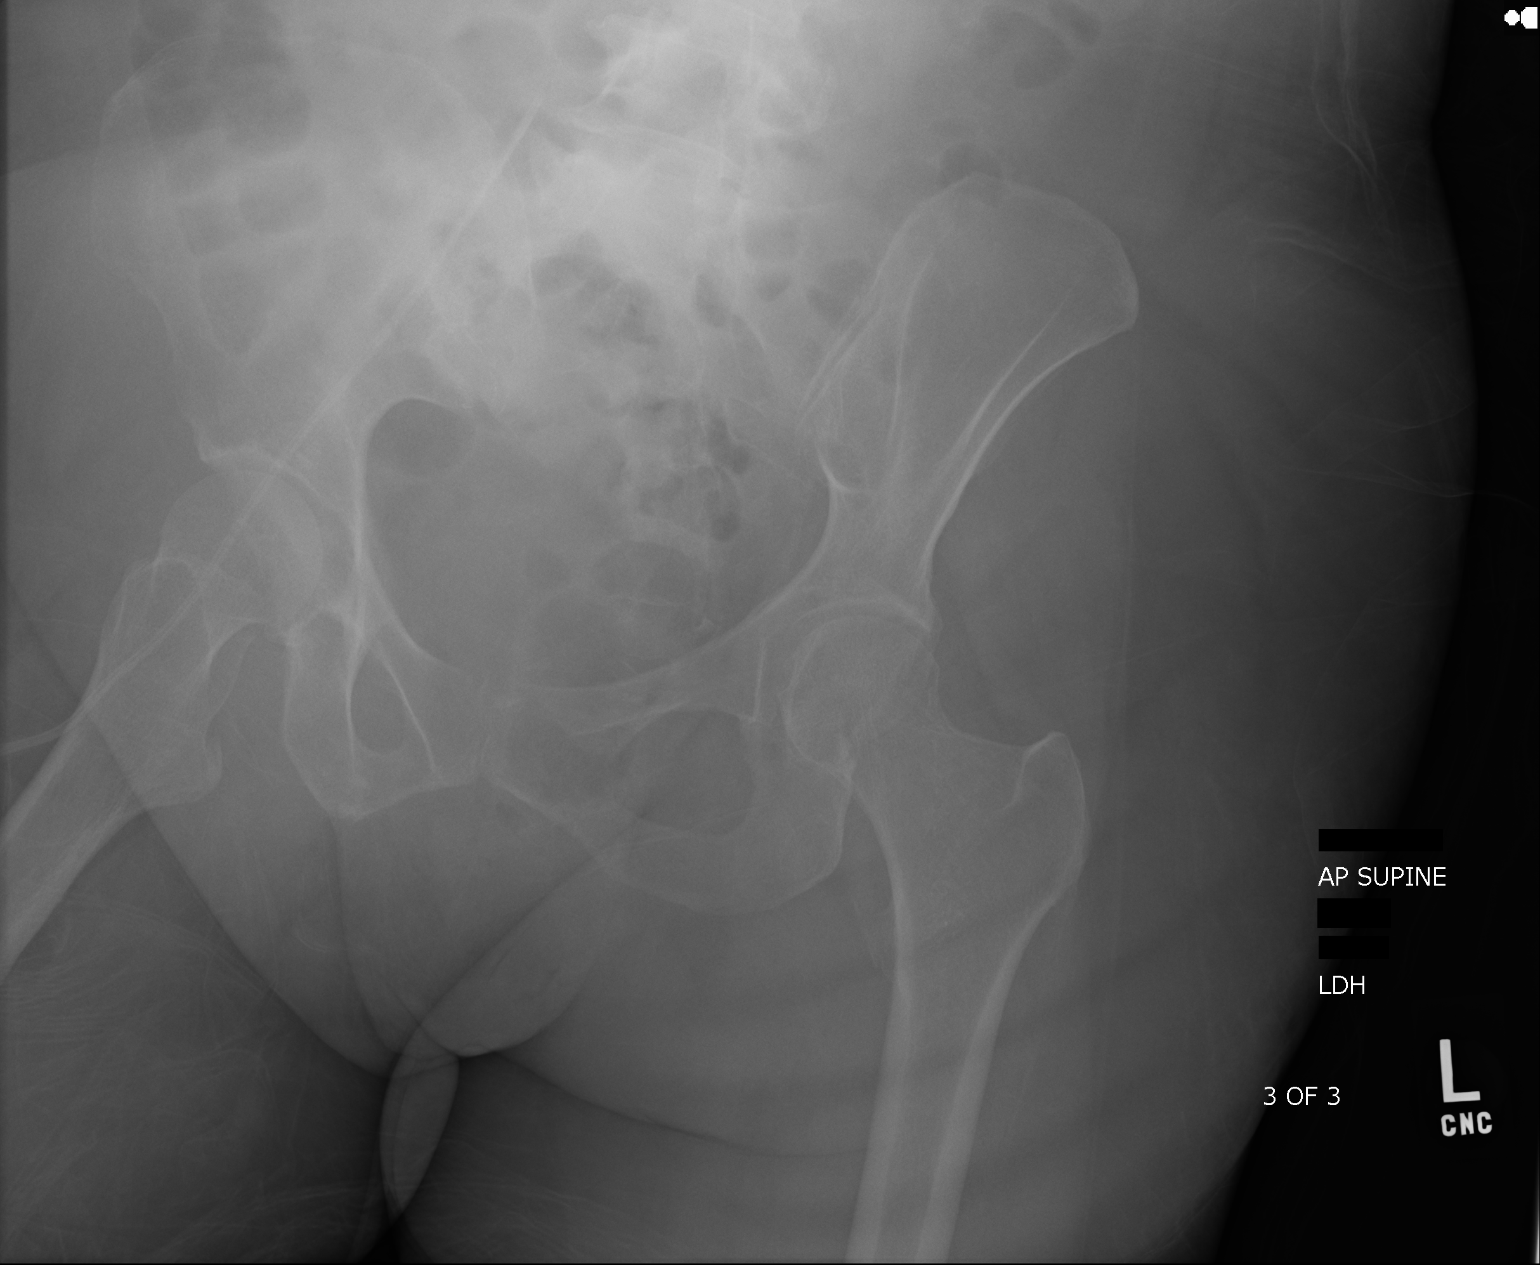

[3 of 3 positions shown; findings below may reference images not displayed]

FINDINGS: Air is seen within nondilated loops of large and small bowel. A
feeding tube is appreciated with tip projecting in the region of the
gastroesophageal junction. Advancement of 5 to 6 cm is recommended. The
visualized bony skeleton is unremarkable.
IMPRESSION: 1. Nonobstructive bowel gas pattern.
2. Feeding tube with tip projecting in the region of the proximal
stomach/gastroesophageal region.

## 2015-07-03 ENCOUNTER — Ambulatory Visit (INDEPENDENT_AMBULATORY_CARE_PROVIDER_SITE_OTHER): Payer: Medicare Other | Admitting: Family Medicine

## 2015-07-03 ENCOUNTER — Encounter: Payer: Self-pay | Admitting: Family Medicine

## 2015-07-03 VITALS — BP 128/70 | HR 113 | Temp 98.2°F | Resp 16

## 2015-07-03 DIAGNOSIS — I10 Essential (primary) hypertension: Secondary | ICD-10-CM | POA: Diagnosis not present

## 2015-07-03 DIAGNOSIS — IMO0001 Reserved for inherently not codable concepts without codable children: Secondary | ICD-10-CM

## 2015-07-03 DIAGNOSIS — E785 Hyperlipidemia, unspecified: Secondary | ICD-10-CM

## 2015-07-03 DIAGNOSIS — E1165 Type 2 diabetes mellitus with hyperglycemia: Secondary | ICD-10-CM | POA: Diagnosis not present

## 2015-07-03 LAB — POCT GLYCOSYLATED HEMOGLOBIN (HGB A1C): Hemoglobin A1C: 7.1

## 2015-07-03 LAB — GLUCOSE, POCT (MANUAL RESULT ENTRY): POC Glucose: 145 mg/dl — AB (ref 70–99)

## 2015-07-03 MED ORDER — SITAGLIPTIN PHOSPHATE 100 MG PO TABS: ORAL_TABLET | ORAL | Status: DC

## 2015-07-03 NOTE — Progress Notes (Signed)
Name: [name] [name]   MRN: [id number]    DOB: [birth year]   Date:07/03/2015       Progress Note  Subjective  Chief Complaint  Chief Complaint  Patient presents with  . Diabetes    follow up  . Hyperlipidemia  . Hypertension    Diabetes She presents for her follow-up diabetic visit. She has type 2 diabetes mellitus. Her disease course has been worsening. Pertinent negatives for hypoglycemia include no headaches. Pertinent negatives for diabetes include no blurred vision, no chest pain, no fatigue, no polydipsia and no polyuria. Pertinent negatives for diabetic complications include no CVA, heart disease, nephropathy or peripheral neuropathy. Current diabetic treatment includes oral agent (monotherapy). She is following a diabetic diet. She monitors blood glucose at home 3-4 x per week. Her breakfast blood glucose range is generally 130-140 mg/dl. An ACE inhibitor/angiotensin II receptor blocker is being taken. Eye exam is current.  Hyperlipidemia This is a chronic problem. The problem is controlled. Recent lipid tests were reviewed and are normal. Pertinent negatives include no chest pain, leg pain, myalgias or shortness of breath. Current antihyperlipidemic treatment includes statins.  Hypertension This is a chronic problem. The problem is unchanged. The problem is controlled. Pertinent negatives include no blurred vision, chest pain, headaches, palpitations or shortness of breath. Past treatments include ACE inhibitors. There is no history of kidney disease, CAD/MI or CVA.    Past Medical History  Diagnosis Date  . Traumatic brain injury ([place])   . Hypertension   . Diabetes mellitus without complication ([place])   . Osteoporosis   . Arthritis   . 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[place])   . Borderline intellectual functioning   . Double vision   . 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 No pertinent past surgical history.  History reviewed. No pertinent family history.  Social  History   Social History  . Marital Status: Single    Spouse Name: N/A  . Number of Children: N/A  . Years of Education: N/A   Occupational History  . Not on file.   Social History Main Topics  . Smoking status: Former Research scientist (life sciences)  . Smokeless tobacco: Never Used  . Alcohol Use: No  . Drug Use: No  . Sexual Activity: Not Currently   Other Topics Concern  . Not on file   Social History Narrative     Current outpatient prescriptions:  .  alendronate (FOSAMAX) 70 MG tablet, 1 TAB PO WEEKLY EARLY AM BEFORE FOOD/MEDS W/WATER. DON'T LIE DOWN FOR30 MIN (OSTEOPOROSIS) *NO CRUSH*, Disp: 4 tablet, Rfl: 0 .  ALPRAZolam (XANAX) 0.5 MG tablet, Take 0.5 mg by mouth 2 (two) times daily as needed (agitation)., Disp: , Rfl:  .  carboxymethylcellulose (REFRESH PLUS) 0.5 % SOLN, Place 1 drop into both eyes 3 (three) times daily as needed (dry eyes)., Disp: , Rfl:  .  cilostazol (PLETAL) 50 MG tablet, Take 1 tablet (50 mg total) by mouth 2 (two) times daily., Disp: 60 tablet, Rfl: 5 .  desmopressin (DDAVP) 0.2 MG tablet, Take 1 tablet (200 mcg total) by mouth at bedtime., Disp: 30 tablet, Rfl: 5 .  docusate sodium (COLACE) 100 MG capsule, Take 1 capsule (100 mg total) by mouth 2 (two) times daily., Disp: 10 capsule, Rfl: 0 .  donepezil (ARICEPT) 10 MG tablet, TAKE 1 TABLET BY MOUTH TWICE A DAY (DEMENTIA), Disp: 60 tablet, Rfl: 0 .  ferrous sulfate 325 (65 FE) MG tablet, Take 325 mg by mouth daily., Disp: , Rfl:  .  FLUoxetine (PROZAC) 10 MG tablet, Take 30 mg by mouth every morning., Disp: , Rfl:  .  glucose blood test strip, , Disp: , Rfl:  .  Lurasidone HCl 20 MG TABS, Take 1 tablet by mouth every morning., Disp: , Rfl:  .  memantine (NAMENDA XR) 28 MG CP24 24 hr capsule, Take 28 mg by mouth daily., Disp: , Rfl:  .  nortriptyline (PAMELOR) 25 MG capsule, Take 1 capsule (25 mg total) by mouth at bedtime., Disp: 30 capsule, Rfl: 0 .  nystatin cream (MYCOSTATIN), Apply topically as needed for dry skin.,  Disp: 120 g, Rfl: 0 .  omeprazole (PRILOSEC) 20 MG capsule, Take 1 capsule (20 mg total) by mouth daily., Disp: 30 capsule, Rfl: 5 .  polyethylene glycol powder (GLYCOLAX/MIRALAX) powder, Take 255 g by mouth once., Disp: 3350 g, Rfl: 1 .  Probiotic Product (ADVANCED PROBIOTIC 10) CAPS, Take 1 capsule by mouth once., Disp: 30 capsule, Rfl: 5 .  promethazine (PHENERGAN) 25 MG tablet, Take 12.5-25 mg by mouth every 6 (six) hours as needed for nausea., Disp: , Rfl:  .  ramipril (ALTACE) 10 MG capsule, Take 1 capsule (10 mg total) by mouth daily., Disp: 90 capsule, Rfl: 3 .  simvastatin (ZOCOR) 10 MG tablet, Take 1 tablet (10 mg total) by mouth at bedtime., Disp: 30 tablet, Rfl: 5 .  sitaGLIPtin (JANUVIA) 50 MG tablet, ONE TABLET BY MOUTH ONCE A DAY. (DIABETES), Disp: 30 tablet, Rfl: 0 .  tolterodine (DETROL LA) 4 MG 24 hr capsule, TAKE 1 CAPSULE BY MOUTH ONCE DAILY FOR BLADDER. **DO NOT CRUSH**, Disp: 30 capsule, Rfl: 0  Allergies  Allergen Reactions  . Dilantin [Phenytoin] Other (See Comments)    unknown  . Penicillins Other (See Comments)    unknown  . Phenytoin Sodium Extended Other (See Comments)  . Sulfa Antibiotics Rash     Review of Systems  Constitutional: Negative for fatigue.  Eyes: Negative for blurred vision.  Respiratory: Negative for shortness of breath.   Cardiovascular: Negative for chest pain and palpitations.  Musculoskeletal: Negative for myalgias.  Neurological: Negative for headaches.  Endo/Heme/Allergies: Negative for polydipsia.    Objective  Filed Vitals:   07/03/15 1019  BP: 128/70  Pulse: 113  Temp: 98.2 F (36.8 C)  TempSrc: Oral  Resp: 16  SpO2: 92%    Physical Exam  Constitutional: She is well-developed, well-nourished, and in no distress.  Eyes: Pupils are equal, round, and reactive to light.  Cardiovascular: Regular rhythm, S1 normal, S2 normal and normal heart sounds.  Tachycardia present.  Exam reveals no gallop.   No murmur  heard. Pulmonary/Chest: Effort normal and breath sounds normal. She has no decreased breath sounds. She has no wheezes.  Abdominal: Soft. Bowel sounds are normal. There is no tenderness.  Musculoskeletal:       Right ankle: She exhibits swelling.  Right lower extremity 1+ pitting edema.  Nursing note and vitals reviewed.    Assessment & Plan  1. Uncontrolled type 2 diabetes mellitus without complication, without Knopf-term current use of insulin (HCC) A1c is 7.5%, worse from 7.1% from January 2017. We'll increase Januvia to 100 mg daily. Encouraged dietary modification. - POCT HgB A1C - POCT Glucose (CBG) - sitaGLIPtin (JANUVIA) 100 MG tablet; ONE TABLET BY MOUTH ONCE A DAY. (DIABETES)  Dispense: 90 tablet; Refill: 1  2. HLD (hyperlipidemia) Fasting lipid panel reviewed and LDL is at goal.  3. Essential hypertension Blood pressure stable and controlled on present antihypertensive therapy.  Normal Recinos Asad A. [facility] 07/03/2015 10:51 AM

## 2015-07-15 DIAGNOSIS — S0081XA Abrasion of other part of head, initial encounter: Secondary | ICD-10-CM | POA: Diagnosis not present

## 2015-07-15 DIAGNOSIS — W19XXXA Unspecified fall, initial encounter: Secondary | ICD-10-CM | POA: Diagnosis not present

## 2015-07-19 ENCOUNTER — Ambulatory Visit: Payer: Medicare Other | Admitting: Sports Medicine

## 2015-08-06 ENCOUNTER — Ambulatory Visit: Payer: Medicare Other | Admitting: Podiatry

## 2015-08-07 ENCOUNTER — Other Ambulatory Visit: Payer: Self-pay

## 2015-08-07 NOTE — Telephone Encounter (Signed)
Routed to Dr. Shah for approval 

## 2015-08-12 ENCOUNTER — Encounter: Payer: Self-pay | Admitting: Emergency Medicine

## 2015-08-12 ENCOUNTER — Emergency Department
Admission: EM | Admit: 2015-08-12 | Discharge: 2015-08-12 | Disposition: A | Discharge status: Home / Self Care | Payer: Medicare Other | Attending: Emergency Medicine | Admitting: Emergency Medicine

## 2015-08-12 DIAGNOSIS — S0081XA Abrasion of other part of head, initial encounter: Secondary | ICD-10-CM | POA: Insufficient documentation

## 2015-08-12 DIAGNOSIS — E119 Type 2 diabetes mellitus without complications: Secondary | ICD-10-CM | POA: Diagnosis not present

## 2015-08-12 DIAGNOSIS — W03XXXA Other fall on same level due to collision with another person, initial encounter: Secondary | ICD-10-CM | POA: Diagnosis not present

## 2015-08-12 DIAGNOSIS — Y939 Activity, unspecified: Secondary | ICD-10-CM | POA: Insufficient documentation

## 2015-08-12 DIAGNOSIS — E785 Hyperlipidemia, unspecified: Secondary | ICD-10-CM | POA: Diagnosis not present

## 2015-08-12 DIAGNOSIS — Y999 Unspecified external cause status: Secondary | ICD-10-CM | POA: Diagnosis not present

## 2015-08-12 DIAGNOSIS — Y929 Unspecified place or not applicable: Secondary | ICD-10-CM | POA: Diagnosis not present

## 2015-08-12 DIAGNOSIS — Z87891 Personal history of nicotine dependence: Secondary | ICD-10-CM | POA: Diagnosis not present

## 2015-08-12 DIAGNOSIS — Z79899 Other long term (current) drug therapy: Secondary | ICD-10-CM | POA: Diagnosis not present

## 2015-08-12 DIAGNOSIS — I10 Essential (primary) hypertension: Secondary | ICD-10-CM | POA: Insufficient documentation

## 2015-08-12 DIAGNOSIS — W1830XA Fall on same level, unspecified, initial encounter: Secondary | ICD-10-CM | POA: Diagnosis not present

## 2015-08-12 DIAGNOSIS — T148XXA Other injury of unspecified body region, initial encounter: Secondary | ICD-10-CM

## 2015-08-12 DIAGNOSIS — W19XXXA Unspecified fall, initial encounter: Secondary | ICD-10-CM

## 2015-08-12 DIAGNOSIS — S0990XA Unspecified injury of head, initial encounter: Secondary | ICD-10-CM | POA: Diagnosis not present

## 2015-08-12 NOTE — ED Notes (Signed)
Await caregivers bringing wheelchair for discharge.
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
## 2015-08-13 ENCOUNTER — Other Ambulatory Visit: Payer: Self-pay | Admitting: Emergency Medicine

## 2015-08-13 DIAGNOSIS — IMO0001 Reserved for inherently not codable concepts without codable children: Secondary | ICD-10-CM

## 2015-08-13 DIAGNOSIS — E1165 Type 2 diabetes mellitus with hyperglycemia: Principal | ICD-10-CM

## 2015-08-13 MED ORDER — ALENDRONATE SODIUM 70 MG PO TABS: ORAL_TABLET | ORAL | Status: DC

## 2015-08-13 MED ORDER — TOLTERODINE TARTRATE ER 4 MG PO CP24: ORAL_CAPSULE | ORAL | Status: DC

## 2015-08-13 MED ORDER — CILOSTAZOL 50 MG PO TABS: 50.0000 mg | ORAL_TABLET | Freq: Two times a day (BID) | ORAL | Status: DC

## 2015-08-13 MED ORDER — DONEPEZIL HCL 10 MG PO TABS: ORAL_TABLET | ORAL | Status: DC

## 2015-08-13 MED ORDER — RAMIPRIL 10 MG PO CAPS: 10.0000 mg | ORAL_CAPSULE | Freq: Every day | ORAL | Status: DC

## 2015-08-13 MED ORDER — OMEPRAZOLE 20 MG PO CPDR: 20.0000 mg | DELAYED_RELEASE_CAPSULE | Freq: Every day | ORAL | Status: DC

## 2015-08-13 MED ORDER — SITAGLIPTIN PHOSPHATE 100 MG PO TABS: ORAL_TABLET | ORAL | Status: DC

## 2015-08-14 DIAGNOSIS — S069X6S Unspecified intracranial injury with loss of consciousness greater than 24 hours without return to pre-existing conscious level with patient surviving, sequela: Secondary | ICD-10-CM | POA: Diagnosis not present

## 2015-08-14 DIAGNOSIS — R413 Other amnesia: Secondary | ICD-10-CM | POA: Diagnosis not present

## 2015-08-14 DIAGNOSIS — F070 Personality change due to known physiological condition: Secondary | ICD-10-CM | POA: Diagnosis not present

## 2015-08-15 ENCOUNTER — Ambulatory Visit: Payer: Medicare Other | Admitting: Podiatry

## 2015-08-30 ENCOUNTER — Encounter: Payer: Self-pay | Admitting: Sports Medicine

## 2015-08-30 ENCOUNTER — Ambulatory Visit (INDEPENDENT_AMBULATORY_CARE_PROVIDER_SITE_OTHER): Payer: Medicare Other | Admitting: Sports Medicine

## 2015-08-30 DIAGNOSIS — B351 Tinea unguium: Secondary | ICD-10-CM | POA: Diagnosis not present

## 2015-08-30 DIAGNOSIS — B359 Dermatophytosis, unspecified: Secondary | ICD-10-CM

## 2015-08-30 DIAGNOSIS — M79676 Pain in unspecified toe(s): Secondary | ICD-10-CM | POA: Diagnosis not present

## 2015-08-30 DIAGNOSIS — E119 Type 2 diabetes mellitus without complications: Secondary | ICD-10-CM

## 2015-08-30 MED ORDER — TEA TREE 100 % EX OIL: 1.0000 "application " | TOPICAL_OIL | Freq: Every day | CUTANEOUS | 3 refills | Status: DC

## 2015-08-30 MED ORDER — KETOCONAZOLE 2 % EX CREA: 1.0000 "application " | TOPICAL_CREAM | Freq: Every day | CUTANEOUS | 0 refills | Status: DC

## 2015-08-30 NOTE — Patient Instructions (Signed)
Apply tea tree oil to nails daily   -Dr. Cannon Kettle

## 2015-08-30 NOTE — Progress Notes (Signed)
Patient ID: [name] [name], female   DOB: Jun 19, 1960, 55 y.o.   MRN: [id number]   Subjective: [name] [name] is a 55 y.o. female patient with history of type 2 diabetes who returns to office today complaining of [place], painful nails; unable to trim. Patient is assisted by her caregiver; states that the glucose reading this morning was not recorded , but runs between 130s Patient denies any new changes in medication or new problems. Patient denies any new cramping, numbness, burning or tingling in the legs.  Patient Active Problem List   Diagnosis Date Noted  . Type 2 diabetes mellitus ([place]) 12/03/2014  . Traumatic brain injury ([place]) 12/03/2014  . Dehydration 08/23/2014  . Chronic constipation 08/23/2014  . Clinical depression 08/23/2014  . Diabetes mellitus, type 2 ([place]) 08/23/2014  . BP (high blood pressure) 08/23/2014  . Injury brain, traumatic ([place]) 08/23/2014  . OP (osteoporosis) 08/23/2014  . HLD (hyperlipidemia) 08/23/2014  . Chest pain syndrome 08/23/2014  . Cognitive impairment 08/23/2014   Current Outpatient Prescriptions on File Prior to Visit  Medication Sig Dispense Refill  . alendronate (FOSAMAX) 70 MG tablet 1 TAB PO WEEKLY EARLY AM BEFORE FOOD/MEDS W/WATER. DON'T LIE DOWN FOR30 MIN (OSTEOPOROSIS) *NO CRUSH* 4 tablet 0  . ALPRAZolam (XANAX) 0.5 MG tablet Take 0.5 mg by mouth 2 (two) times daily as needed (agitation).    . carboxymethylcellulose (REFRESH PLUS) 0.5 % SOLN Place 1 drop into both eyes 3 (three) times daily as needed (dry eyes).    . cilostazol (PLETAL) 50 MG tablet Take 1 tablet (50 mg total) by mouth 2 (two) times daily. 60 tablet 5  . desmopressin (DDAVP) 0.2 MG tablet Take 1 tablet (200 mcg total) by mouth at bedtime. 30 tablet 5  . docusate sodium (COLACE) 100 MG capsule Take 1 capsule (100 mg total) by mouth 2 (two) times daily. 10 capsule 0  . donepezil (ARICEPT) 10 MG tablet TAKE 1 TABLET BY MOUTH TWICE A DAY (DEMENTIA) 60 tablet 3  . ferrous sulfate 325 (65  FE) MG tablet Take 325 mg by mouth daily.    Marland Kitchen FLUoxetine (PROZAC) 10 MG tablet Take 30 mg by mouth every morning.    Marland Kitchen glucose blood test strip     . Lurasidone HCl 20 MG TABS Take 1 tablet by mouth every morning.    . memantine (NAMENDA XR) 28 MG CP24 24 hr capsule Take 28 mg by mouth daily.    . nortriptyline (PAMELOR) 25 MG capsule Take 1 capsule (25 mg total) by mouth at bedtime. 30 capsule 0  . nystatin cream (MYCOSTATIN) Apply topically as needed for dry skin. 120 g 0  . omeprazole (PRILOSEC) 20 MG capsule Take 1 capsule (20 mg total) by mouth daily. 30 capsule 5  . polyethylene glycol powder (GLYCOLAX/MIRALAX) powder Take 255 g by mouth once. 3350 g 1  . Probiotic Product (ADVANCED PROBIOTIC 10) CAPS Take 1 capsule by mouth once. 30 capsule 5  . promethazine (PHENERGAN) 25 MG tablet Take 12.5-25 mg by mouth every 6 (six) hours as needed for nausea.    . ramipril (ALTACE) 10 MG capsule Take 1 capsule (10 mg total) by mouth daily. 90 capsule 1  . simvastatin (ZOCOR) 10 MG tablet Take 1 tablet (10 mg total) by mouth at bedtime. 30 tablet 5  . sitaGLIPtin (JANUVIA) 100 MG tablet ONE TABLET BY MOUTH ONCE A DAY. (DIABETES) 90 tablet 1  . tolterodine (DETROL LA) 4 MG 24 hr capsule TAKE 1 CAPSULE  BY MOUTH ONCE DAILY FOR BLADDER. **DO NOT CRUSH** 30 capsule 3   No current facility-administered medications on file prior to visit.    Allergies  Allergen Reactions  . Dilantin [Phenytoin] Other (See Comments)    unknown  . Penicillins Other (See Comments)    unknown unknown  . Phenytoin Sodium Extended Other (See Comments)    Other reaction(s): Other (See Comments)  . Sulfa Antibiotics Rash and Other (See Comments)    unknown   Objective: General: Patient is awake, alert, and oriented x 2 and in no acute distress in wheelchair.   Integument: Skin is dry and supple bilateral. Nails are tender, [place], thickened and  dystrophic with subungual debris, consistent with onychomycosis, 1-5  bilateral. Mild scaly skin to right foot. No open lesions or preulcerative lesions present bilateral. Remaining integument unremarkable.  Vasculature:  Dorsalis Pedis pulse 1/4 bilateral. Posterior Tibial pulse  1/4 bilateral.  Capillary fill time <3 sec 1-5 bilateral. Scant hair growth to the level of the digits. Temperature gradient decreased on right. No varicosities present bilateral. No edema present bilateral.   Neurology: The patient has intact sensation measured with a 5.07/10g Semmes Weinstein Monofilament at all pedal sites bilateral . Vibratory sensation diminished bilateral with tuning fork. No Babinski sign present bilateral.   Musculoskeletal: No gross pedal deformities noted bilateral. Muscular strength 4/5 in all lower extremity muscular groups bilateral without pain or limitation on range of motion with the right>left sided weakness secondary to TBI. No tenderness with calf compression bilateral.  Assessment and Plan: Problem List Items Addressed This Visit    None    Visit Diagnoses    Tinea    -  Primary   Relevant Medications   ketoconazole (NIZORAL) 2 % cream   Dermatophytosis of nail       Relevant Medications   ketoconazole (NIZORAL) 2 % cream   Tea Tree 100 % OIL   Pain of toe, unspecified laterality       Diabetes mellitus without complication ([place])         -Examined patient. -Discussed and educated patient on diabetic foot care, especially with  regards to the vascular, neurological and musculoskeletal systems.  -Stressed the importance of good glycemic control and the detriment of not  controlling glucose levels in relation to the foot. -Mechanically debrided all nails 1-5 bilateral using sterile nail nipper and filed with dremel without incident  -Recommend cont with daily antifungal and moisturizing creams; refilled ketoconazole and rx tea tree oil for brittle thick nails -Answered all patient questions -Patient to return in 3 months for at risk foot  care -Patient advised to call the office if any problems or questions arise in the  Meantime.  Landis Martins, DPM

## 2015-09-10 DIAGNOSIS — F339 Major depressive disorder, recurrent, unspecified: Secondary | ICD-10-CM | POA: Diagnosis not present

## 2015-09-12 ENCOUNTER — Other Ambulatory Visit: Payer: Self-pay

## 2015-09-16 ENCOUNTER — Other Ambulatory Visit: Payer: Self-pay | Admitting: Family Medicine

## 2015-09-16 NOTE — Telephone Encounter (Signed)
Patient requesting refill of Accu-Chek Test Strips and Alcohol Swabs be sent to [facility].

## 2015-10-03 ENCOUNTER — Encounter: Payer: Self-pay | Admitting: Family Medicine

## 2015-10-03 ENCOUNTER — Ambulatory Visit (INDEPENDENT_AMBULATORY_CARE_PROVIDER_SITE_OTHER): Payer: Medicare Other | Admitting: Family Medicine

## 2015-10-03 VITALS — BP 132/84 | HR 94 | Temp 98.7°F | Resp 16 | Ht 67.0 in | Wt 168.0 lb

## 2015-10-03 DIAGNOSIS — E785 Hyperlipidemia, unspecified: Secondary | ICD-10-CM

## 2015-10-03 DIAGNOSIS — Z794 Long term (current) use of insulin: Secondary | ICD-10-CM | POA: Diagnosis not present

## 2015-10-03 DIAGNOSIS — E1121 Type 2 diabetes mellitus with diabetic nephropathy: Secondary | ICD-10-CM | POA: Diagnosis not present

## 2015-10-03 DIAGNOSIS — I10 Essential (primary) hypertension: Secondary | ICD-10-CM

## 2015-10-03 DIAGNOSIS — Z23 Encounter for immunization: Secondary | ICD-10-CM | POA: Diagnosis not present

## 2015-10-03 LAB — POCT GLYCOSYLATED HEMOGLOBIN (HGB A1C): HEMOGLOBIN A1C: 7.5

## 2015-10-03 LAB — GLUCOSE, POCT (MANUAL RESULT ENTRY): POC GLUCOSE: 132 mg/dL — AB (ref 70–99)

## 2015-10-03 MED ORDER — OMEPRAZOLE 20 MG PO CPDR: 20.0000 mg | DELAYED_RELEASE_CAPSULE | Freq: Every day | ORAL | 5 refills | Status: DC

## 2015-10-03 MED ORDER — CHOLECALCIFEROL 10 MCG (400 UNIT) PO TABS: 400.0000 [IU] | ORAL_TABLET | Freq: Every day | ORAL | 2 refills | Status: DC

## 2015-10-03 MED ORDER — SIMVASTATIN 10 MG PO TABS: 10.0000 mg | ORAL_TABLET | Freq: Every day | ORAL | 5 refills | Status: DC

## 2015-10-03 MED ORDER — NORTRIPTYLINE HCL 25 MG PO CAPS: 25.0000 mg | ORAL_CAPSULE | Freq: Every day | ORAL | 5 refills | Status: DC

## 2015-10-03 MED ORDER — DESMOPRESSIN ACETATE 0.2 MG PO TABS: 200.0000 ug | ORAL_TABLET | Freq: Every day | ORAL | 5 refills | Status: DC

## 2015-10-03 MED ORDER — CILOSTAZOL 50 MG PO TABS: 50.0000 mg | ORAL_TABLET | Freq: Two times a day (BID) | ORAL | 5 refills | Status: DC

## 2015-10-03 MED ORDER — METFORMIN HCL 500 MG PO TABS: 500.0000 mg | ORAL_TABLET | Freq: Every day | ORAL | 0 refills | Status: DC

## 2015-10-03 MED ORDER — DOCUSATE SODIUM 100 MG PO CAPS: 100.0000 mg | ORAL_CAPSULE | Freq: Two times a day (BID) | ORAL | 5 refills | Status: DC

## 2015-10-03 MED ORDER — ADVANCED PROBIOTIC 10 PO CAPS: 1.0000 | ORAL_CAPSULE | Freq: Once | ORAL | 5 refills | Status: AC
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
## 2015-10-16 ENCOUNTER — Other Ambulatory Visit: Payer: Self-pay | Admitting: Family Medicine

## 2015-10-16 DIAGNOSIS — E785 Hyperlipidemia, unspecified: Secondary | ICD-10-CM | POA: Diagnosis not present

## 2015-10-17 ENCOUNTER — Other Ambulatory Visit: Payer: Self-pay | Admitting: Family Medicine

## 2015-10-17 ENCOUNTER — Other Ambulatory Visit (INDEPENDENT_AMBULATORY_CARE_PROVIDER_SITE_OTHER): Payer: Medicare Other

## 2015-10-17 DIAGNOSIS — E119 Type 2 diabetes mellitus without complications: Secondary | ICD-10-CM | POA: Diagnosis not present

## 2015-10-17 LAB — COMPREHENSIVE METABOLIC PANEL
ALK PHOS: 111 IU/L (ref 39–117)
ALT: 20 IU/L (ref 0–32)
AST: 17 IU/L (ref 0–40)
Albumin/Globulin Ratio: 1.4 (ref 1.2–2.2)
Albumin: 4 g/dL (ref 3.5–5.5)
BUN/Creatinine Ratio: 9 (ref 9–23)
BUN: 9 mg/dL (ref 6–24)
Bilirubin Total: 0.3 mg/dL (ref 0.0–1.2)
CALCIUM: 9.8 mg/dL (ref 8.7–10.2)
CHLORIDE: 98 mmol/L (ref 96–106)
CO2: 29 mmol/L (ref 18–29)
CREATININE: 0.95 mg/dL (ref 0.57–1.00)
GFR calc Af Amer: 77 mL/min/{1.73_m2} (ref >59)
GFR, EST NON AFRICAN AMERICAN: 67 mL/min/{1.73_m2} (ref >59)
GLUCOSE: 142 mg/dL — AB (ref 65–99)
Globulin, Total: 2.8 g/dL (ref 1.5–4.5)
Potassium: 4.6 mmol/L (ref 3.5–5.2)
Sodium: 139 mmol/L (ref 134–144)
Total Protein: 6.8 g/dL (ref 6.0–8.5)

## 2015-10-17 LAB — LIPID PANEL WITH LDL/HDL RATIO
CHOLESTEROL TOTAL: 151 mg/dL (ref 100–199)
HDL: 102 mg/dL (ref >39)
LDL Calculated: 39 mg/dL (ref 0–99)
LDl/HDL Ratio: 0.4 ratio units (ref 0.0–3.2)
TRIGLYCERIDES: 51 mg/dL (ref 0–149)
VLDL Cholesterol Cal: 10 mg/dL (ref 5–40)

## 2015-10-17 LAB — POCT UA - MICROALBUMIN: Microalbumin Ur, POC: 50 mg/L

## 2015-10-17 NOTE — Progress Notes (Signed)
poc

## 2015-10-21 ENCOUNTER — Telehealth: Payer: Self-pay | Admitting: Family Medicine

## 2015-10-21 NOTE — Telephone Encounter (Signed)
Pts caregiver is asking for written results of patients urine to be faxed to [phone] Attn:Becky

## 2015-10-22 NOTE — Telephone Encounter (Signed)
Patient's urine results have been faxed to [facility] on 10/22/2015 @ 11:48am as requested by patient's caregiver

## 2015-11-11 ENCOUNTER — Other Ambulatory Visit: Payer: Self-pay | Admitting: Family Medicine

## 2015-11-21 ENCOUNTER — Telehealth: Payer: Self-pay | Admitting: Family Medicine

## 2015-11-21 NOTE — Telephone Encounter (Signed)
Please D/C all previous vitamin D orders. Make new order for Vitamin D 400 individual units once daily. Please fax to [facility] [phone] Attn: Jacqlyn Larsen. If you have any questions she can be reached at [phone]

## 2015-11-22 ENCOUNTER — Other Ambulatory Visit: Payer: Self-pay

## 2015-11-22 DIAGNOSIS — Z711 Person with feared health complaint in whom no diagnosis is made: Secondary | ICD-10-CM | POA: Diagnosis not present

## 2015-11-22 DIAGNOSIS — Y92099 Unspecified place in other non-institutional residence as the place of occurrence of the external cause: Secondary | ICD-10-CM | POA: Diagnosis not present

## 2015-11-22 DIAGNOSIS — E1121 Type 2 diabetes mellitus with diabetic nephropathy: Secondary | ICD-10-CM

## 2015-11-22 DIAGNOSIS — Z794 Long term (current) use of insulin: Principal | ICD-10-CM

## 2015-11-22 DIAGNOSIS — W050XXA Fall from non-moving wheelchair, initial encounter: Secondary | ICD-10-CM | POA: Diagnosis not present

## 2015-11-22 MED ORDER — TOLTERODINE TARTRATE ER 4 MG PO CP24: ORAL_CAPSULE | ORAL | 3 refills | Status: DC

## 2015-11-22 MED ORDER — METFORMIN HCL 500 MG PO TABS
500.0000 mg | ORAL_TABLET | Freq: Every day | ORAL | 0 refills | Status: DC
Start: 2015-11-22 — End: 2016-07-16

## 2015-11-22 MED ORDER — DONEPEZIL HCL 10 MG PO TABS: ORAL_TABLET | ORAL | 3 refills | Status: DC

## 2015-11-22 NOTE — Telephone Encounter (Signed)
Medication has been refilled and sent to [facility]

## 2015-11-28 ENCOUNTER — Telehealth: Payer: Self-pay | Admitting: Family Medicine

## 2015-11-28 NOTE — Telephone Encounter (Signed)
Becky from [facility] is requesting that you D/C vitamin D order 400 IU for twice daily. And send to [facility] (P) [phone]

## 2015-12-03 ENCOUNTER — Ambulatory Visit: Payer: Medicare Other | Admitting: Podiatry

## 2015-12-05 NOTE — Telephone Encounter (Signed)
Pt needs refill on Fosamax to go to [facility].

## 2015-12-06 NOTE — Telephone Encounter (Signed)
LMOM for Becky to call the office

## 2015-12-06 NOTE — Telephone Encounter (Signed)
She'll need an appointment for a complete physical exam to order bone density scan first

## 2015-12-11 ENCOUNTER — Ambulatory Visit (INDEPENDENT_AMBULATORY_CARE_PROVIDER_SITE_OTHER): Payer: Medicare Other | Admitting: Family Medicine

## 2015-12-11 ENCOUNTER — Encounter: Payer: Self-pay | Admitting: Family Medicine

## 2015-12-11 VITALS — BP 134/75 | HR 94 | Temp 97.6°F | Resp 18

## 2015-12-11 DIAGNOSIS — Z593 Problems related to living in residential institution: Secondary | ICD-10-CM

## 2015-12-11 DIAGNOSIS — R1310 Dysphagia, unspecified: Secondary | ICD-10-CM | POA: Diagnosis not present
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
## 2015-12-12 ENCOUNTER — Other Ambulatory Visit: Payer: Self-pay | Admitting: Family Medicine

## 2015-12-23 ENCOUNTER — Other Ambulatory Visit: Payer: Self-pay | Admitting: Sports Medicine

## 2015-12-23 DIAGNOSIS — B359 Dermatophytosis, unspecified: Secondary | ICD-10-CM

## 2015-12-26 ENCOUNTER — Ambulatory Visit (INDEPENDENT_AMBULATORY_CARE_PROVIDER_SITE_OTHER): Payer: Medicare Other | Admitting: Podiatry

## 2015-12-26 DIAGNOSIS — B351 Tinea unguium: Secondary | ICD-10-CM

## 2015-12-26 DIAGNOSIS — M79676 Pain in unspecified toe(s): Secondary | ICD-10-CM

## 2015-12-26 DIAGNOSIS — I739 Peripheral vascular disease, unspecified: Secondary | ICD-10-CM | POA: Diagnosis not present

## 2015-12-26 DIAGNOSIS — L601 Onycholysis: Secondary | ICD-10-CM

## 2015-12-26 DIAGNOSIS — S90222A Contusion of left lesser toe(s) with damage to nail, initial encounter: Secondary | ICD-10-CM

## 2015-12-26 NOTE — Progress Notes (Signed)
Patient ID: LYRIK RODRICK, female   DOB: 02-May-1960, 55 y.o.   MRN: [id number]   Subjective: Ms. Gradowski  presents the office today for painful elongated toenails, which she is unable to do herself. She presents today with a representative from her facility. She did get her left hallux toenail caught in wheelchair last week and the toenail turned dark but denies any pain, redness, drainage. Denies any redness or drainage from the nails.  Denies any recent changes or any new complaints.  Objective:her  Awake, Alert, NAD; presents in a wheelchair.  DP/PT pulses decreased, CRT delayed b/l. Nails hypertrophic, dystrophic, elongated, brittle x10. No surrounding erythema or drainage. There subjective tenderness on nails 1-5 bilaterally. The left hallux toenail has evidence of subungual hematoma with mild loosening of the nail distally. There is no surrounding erythema, ascending cellulitis, drainage, or signs of infection. No area of pinpoint tenderness to the hallux. There is no open sore in the nail bed.  There is  dry, scaly skin on the plantar right foot. There is currently no skin changes interdigitally.  No open skin lesions or pre-ulcerative lesion No other areas of tenderness to bilateral lower extremity is. No calf pain with compression, swelling, warmth, erythema.   Assessment: 55 year old female with symptomatic onychomycosis, PVD; left hallux toenail injury.   Plan: -Treatment options discussed including alternatives, risks, complications. -Nail sharply debrided [id number] without complications. The left hallux toenail was sharply debrided without any complications to remove the loose toenail. Old dried blood was debrided. Monitor for signs/symptoms of infection.  -Discussed the importance of daily foot inspection. -Followup in 3 months or sooner if any problems are to arise or any changes symptoms. In the meantime call any questions, concerns.  Celesta Gentile, DPM

## 2015-12-30 ENCOUNTER — Encounter: Payer: Self-pay | Admitting: Emergency Medicine

## 2015-12-30 ENCOUNTER — Emergency Department
Admission: EM | Admit: 2015-12-30 | Discharge: 2015-12-30 | Disposition: A | Discharge status: Home / Self Care | Payer: Medicare Other | Attending: Emergency Medicine | Admitting: Emergency Medicine

## 2015-12-30 DIAGNOSIS — Z048 Encounter for examination and observation for other specified reasons: Secondary | ICD-10-CM | POA: Insufficient documentation

## 2015-12-30 DIAGNOSIS — Z79899 Other long term (current) drug therapy: Secondary | ICD-10-CM | POA: Diagnosis not present

## 2015-12-30 DIAGNOSIS — Z7984 Long term (current) use of oral hypoglycemic drugs: Secondary | ICD-10-CM | POA: Diagnosis not present

## 2015-12-30 DIAGNOSIS — Y929 Unspecified place or not applicable: Secondary | ICD-10-CM | POA: Insufficient documentation

## 2015-12-30 DIAGNOSIS — W19XXXA Unspecified fall, initial encounter: Secondary | ICD-10-CM

## 2015-12-30 DIAGNOSIS — I10 Essential (primary) hypertension: Secondary | ICD-10-CM | POA: Diagnosis not present

## 2015-12-30 DIAGNOSIS — Z043 Encounter for examination and observation following other accident: Secondary | ICD-10-CM | POA: Diagnosis not present

## 2015-12-30 DIAGNOSIS — Y939 Activity, unspecified: Secondary | ICD-10-CM | POA: Diagnosis not present

## 2015-12-30 DIAGNOSIS — W1839XA Other fall on same level, initial encounter: Secondary | ICD-10-CM | POA: Diagnosis not present

## 2015-12-30 DIAGNOSIS — Z87891 Personal history of nicotine dependence: Secondary | ICD-10-CM | POA: Diagnosis not present

## 2015-12-30 DIAGNOSIS — E119 Type 2 diabetes mellitus without complications: Secondary | ICD-10-CM | POA: Diagnosis not present

## 2015-12-30 DIAGNOSIS — Y999 Unspecified external cause status: Secondary | ICD-10-CM | POA: Diagnosis not present

## 2015-12-30 NOTE — ED Notes (Signed)
Pt denies CP, SOB, N/V/D, LOC, fevers, dizziness or pain.  Pts sts that she is at baseline, caregiver confirms.  Fall witnessed by caregiver.

## 2015-12-30 NOTE — ED Triage Notes (Signed)
Pt presents to ED triage via wheelchair. Pt caregiver reports pt was in a chair in the shower, she was buckled in, caregiver reports pt was pushing herself with her legs and the chair tipped over to the side. Caregiver denies pt hitting head. Pt denies pain anywhere, caregiver reports pt acting normal. No complaints at this time.

## 2015-12-30 NOTE — ED Provider Notes (Signed)
[facility] Emergency Department Provider Note  ____________________________________________  Time seen: Approximately 10:28 PM  I have reviewed the triage vital signs and the nursing notes.   HISTORY  Chief Complaint Fall    HPI JALEYZA [name] is a 55 y.o. female that presents after tipping shower chair over while buckled in earlier tonight. Patient was with caregiver. Chair landed on the right side. Patient denies landing on side of body or head. Patient denies pain. Caregiver denies head trauma. No loss of consciousness. Caregiver states that patient is at baseline.   Past Medical History:  Diagnosis Date  . Arthritis   . Borderline intellectual functioning   . Diabetes mellitus without complication ([place])   . Double vision   . Hypertension   . Osteoporosis   . 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[place])   . 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 Traumatic brain injury [facility])     Patient Active Problem List   Diagnosis Date Noted  . Dysphagia 12/11/2015  . Type 2 diabetes mellitus ([place]) 12/03/2014  . Traumatic brain injury ([place]) 12/03/2014  . Dehydration 08/23/2014  . Chronic constipation 08/23/2014  . Clinical depression 08/23/2014  . Diabetes mellitus, type 2 ([place]) 08/23/2014  . BP (high blood pressure) 08/23/2014  . Injury brain, traumatic ([place]) 08/23/2014  . OP (osteoporosis) 08/23/2014  . HLD (hyperlipidemia) 08/23/2014  . Chest pain syndrome 08/23/2014  . Cognitive impairment 08/23/2014    History reviewed. No pertinent surgical history.  Prior to Admission medications   Medication Sig Start Date End Date Taking? Authorizing Provider  ACCU-CHEK AVIVA PLUS test strip CHECK BLOOD SUGAR TWICE A WEEK 09/16/15   Steele Sizer, MD  Alcohol Swabs (B-D SINGLE USE SWABS REGULAR) PADS USE AS DIRECTED (CHECK BLOOD SUGAR TWICE A WEEK) 09/16/15   Steele Sizer, MD  alendronate (FOSAMAX) 70 MG tablet 1 TAB PO WEEKLY EARLY AM BEFORE FOOD/MEDS W/WATER. DON'T LIE DOWN  FOR30 MIN (OSTEOPOROSIS) *NO CRUSH* 08/13/15   Roselee Nova, MD  ALPRAZolam Duanne Moron) 0.5 MG tablet Take 0.5 mg by mouth 2 (two) times daily as needed (agitation).    Historical Provider, MD  carboxymethylcellulose (REFRESH PLUS) 0.5 % SOLN Place 1 drop into both eyes 3 (three) times daily as needed (dry eyes).    Historical Provider, MD  cholecalciferol (VITAMIN D) 400 units TABS tablet Take 1 tablet (400 Units total) by mouth daily. 10/03/15   Roselee Nova, MD  cilostazol (PLETAL) 50 MG tablet Take 1 tablet (50 mg total) by mouth 2 (two) times daily. 10/03/15   Roselee Nova, MD  desmopressin (DDAVP) 0.2 MG tablet Take 1 tablet (200 mcg total) by mouth at bedtime. 10/03/15   Roselee Nova, MD  docusate sodium (COLACE) 100 MG capsule Take 1 capsule (100 mg total) by mouth 2 (two) times daily. 10/03/15   Roselee Nova, MD  donepezil (ARICEPT) 10 MG tablet TAKE 1 TABLET BY MOUTH TWICE A DAY (DEMENTIA) 11/22/15   Roselee Nova, MD  ferrous sulfate 325 (65 FE) MG tablet Take 325 mg by mouth daily.    Historical Provider, MD  FLUoxetine (PROZAC) 10 MG tablet Take 30 mg by mouth every morning.    Historical Provider, MD  ketoconazole (NIZORAL) 2 % cream APPLY TOPICALLY ONCE DAILY 12/23/15   Landis Martins, DPM  Lurasidone HCl 20 MG TABS Take 1 tablet by mouth every morning.    Historical Provider, MD  memantine (NAMENDA XR) 28 MG CP24 24  hr capsule Take 28 mg by mouth daily.    Historical Provider, MD  metFORMIN (GLUCOPHAGE) 500 MG tablet Take 1 tablet (500 mg total) by mouth daily with breakfast. 11/22/15   Roselee Nova, MD  nortriptyline (PAMELOR) 25 MG capsule Take 1 capsule (25 mg total) by mouth at bedtime. 10/03/15   Roselee Nova, MD  nystatin cream (MYCOSTATIN) Apply topically as needed for dry skin. 10/17/14   Ashok Norris, MD  omeprazole (PRILOSEC) 20 MG capsule Take 1 capsule (20 mg total) by mouth daily. 10/03/15   Roselee Nova, MD  polyethylene glycol powder  (GLYCOLAX/MIRALAX) powder Take 255 g by mouth once. 10/17/14   Ashok Norris, MD  promethazine (PHENERGAN) 25 MG tablet Take 12.5-25 mg by mouth every 6 (six) hours as needed for nausea.    Historical Provider, MD  ramipril (ALTACE) 10 MG capsule Take 1 capsule (10 mg total) by mouth daily. 08/13/15   Roselee Nova, MD  simvastatin (ZOCOR) 10 MG tablet Take 1 tablet (10 mg total) by mouth at bedtime. 10/03/15   Roselee Nova, MD  sitaGLIPtin (JANUVIA) 100 MG tablet ONE TABLET BY MOUTH ONCE A DAY. (DIABETES) 08/13/15   Roselee Nova, MD  Tea Tree 100 % OIL Apply 1 application topically daily. To nails 08/30/15   Landis Martins, DPM  tolterodine (DETROL LA) 4 MG 24 hr capsule TAKE 1 CAPSULE BY MOUTH ONCE DAILY FOR BLADDER. **DO NOT CRUSH** 11/22/15   Roselee Nova, MD    Allergies Dilantin [phenytoin]; Penicillins; Phenytoin sodium extended; Phenytoin sodium extended; and Sulfa antibiotics  No family history on file.  Social History Social History  Substance Use Topics  . Smoking status: Former Research scientist (life sciences)  . Smokeless tobacco: Never Used  . Alcohol use No     Review of Systems  Eyes: No visual changes.  ENT: No upper respiratory complaints. Cardiovascular: no chest pain. Respiratory: no cough. No SOB. Gastrointestinal: No abdominal pain.  No nausea, no vomiting.  Musculoskeletal: Negative for musculoskeletal pain. Skin: Negative for rash, abrasions, lacerations, ecchymosis. Neurological: Negative for headaches.  ____________________________________________   PHYSICAL EXAM:  VITAL SIGNS: ED Triage Vitals  Enc Vitals Group     BP 12/30/15 2059 129/89     Pulse Rate 12/30/15 2059 89     Resp 12/30/15 2059 18     Temp 12/30/15 2059 97.7 F (36.5 C)     Temp Source 12/30/15 2059 Oral     SpO2 12/30/15 2059 95 %     Weight 12/30/15 2059 190 lb (86.2 kg)     Height 12/30/15 2059 5\' 7"  (1.702 m)     Head Circumference --      Peak Flow --      Pain Score 12/30/15 2214 0      Pain Loc --      Pain Edu? --      Excl. in [place]? --      Constitutional: Well appearing and in no acute distress. Patient sitting comfortably in wheelchair. Eyes: Conjunctivae are normal. PERRL. Eyes do not tract, which caregiver says is baseline. Head: Atraumatic. ENT:      Ears:       Nose: No congestion/rhinnorhea.      Mouth/Throat: Mucous membranes are moist.  Neck: No stridor.  No cervical spine tenderness to palpation. Cardiovascular: Normal rate, regular rhythm. Normal S1 and S2.  Good peripheral circulation. Respiratory: Normal respiratory effort without tachypnea or retractions. Lungs  CTAB. Good air entry to the bases with no decreased or absent breath sounds. Gastrointestinal: Bowel sounds 4 quadrants. Soft and nontender to palpation. No guarding or rigidity. No palpable masses. No distention.  Musculoskeletal: Full range of motion to all extremities. No gross deformities appreciated. Neurologic:  No gross focal neurologic deficits are appreciated.  Skin:  Skin is warm, dry and intact. No rash noted.    ____________________________________________   LABS (all labs ordered are listed, but only abnormal results are displayed)  Labs Reviewed - No data to display ____________________________________________  EKG   ____________________________________________  RADIOLOGY  No results found.  ____________________________________________    PROCEDURES  Procedure(s) performed:    Procedures    Medications - No data to display   ____________________________________________   INITIAL IMPRESSION / ASSESSMENT AND PLAN / ED COURSE  Pertinent labs & imaging results that were available during my care of the patient were reviewed by me and considered in my medical decision making (see chart for details).  Review of the [place] CSRS was performed in accordance of the [place] prior to dispensing any controlled drugs.  Clinical Course     Patient presents to ED  after tipping wheelchair on right side earlier this evening. Patient and caregiver deny any body contact with floor. Patient denies any pain. Exam is reassuring. I do not think imaging is necessary at this time. Patient is to follow up with PCP as needed or otherwise directed. Patient is given ED precautions to return to the ED for any worsening or new symptoms.  ____________________________________________  FINAL CLINICAL IMPRESSION(S) / ED DIAGNOSES  Final diagnoses:  Fall, initial encounter      NEW MEDICATIONS STARTED DURING THIS VISIT:  Discharge Medication List as of 12/30/2015 10:11 PM          This chart was dictated using voice recognition software/Dragon. Despite best efforts to proofread, errors can occur which can change the meaning. Any change was purely unintentional.   Laban Emperor, PA-C 12/30/15 2240    Lavonia Drafts, MD 12/30/15 2253

## 2016-01-02 ENCOUNTER — Ambulatory Visit: Payer: Medicare Other | Admitting: Family Medicine

## 2016-01-06 ENCOUNTER — Ambulatory Visit: Payer: Medicare Other | Admitting: Family Medicine

## 2016-02-07 ENCOUNTER — Telehealth: Payer: Self-pay | Admitting: Emergency Medicine

## 2016-02-07 NOTE — Telephone Encounter (Signed)
Got physical therapy order for 1 time a week need to be increased to 2 times a week.  Will have PT fax order for signature.   Need order for depend maxi pads insert #[id number]  Fax to [employer] [facility]  [phone]

## 2016-02-18 ENCOUNTER — Ambulatory Visit: Payer: Medicare Other | Admitting: Family Medicine

## 2016-02-25 ENCOUNTER — Ambulatory Visit: Payer: Medicare Other | Admitting: Family Medicine

## 2016-03-03 DIAGNOSIS — F339 Major depressive disorder, recurrent, unspecified: Secondary | ICD-10-CM | POA: Diagnosis not present

## 2016-03-04 ENCOUNTER — Encounter: Payer: Self-pay | Admitting: Family Medicine

## 2016-03-04 ENCOUNTER — Ambulatory Visit (INDEPENDENT_AMBULATORY_CARE_PROVIDER_SITE_OTHER): Payer: Medicare Other | Admitting: Family Medicine

## 2016-03-04 VITALS — BP 128/67 | HR 98 | Temp 98.6°F | Resp 17

## 2016-03-04 DIAGNOSIS — R8290 Unspecified abnormal findings in urine: Secondary | ICD-10-CM | POA: Diagnosis not present

## 2016-03-04 DIAGNOSIS — E119 Type 2 diabetes mellitus without complications: Secondary | ICD-10-CM | POA: Diagnosis not present

## 2016-03-04 DIAGNOSIS — I10 Essential (primary) hypertension: Secondary | ICD-10-CM

## 2016-03-04 DIAGNOSIS — E7800 Pure hypercholesterolemia, unspecified: Secondary | ICD-10-CM | POA: Diagnosis not present

## 2016-03-04 LAB — LIPID PANEL
CHOL/HDL RATIO: 1.7 ratio (ref <5.0)
Cholesterol: 148 mg/dL (ref <200)
HDL: 85 mg/dL (ref >50)
LDL CALC: 48 mg/dL (ref <100)
Triglycerides: 74 mg/dL (ref <150)
VLDL: 15 mg/dL (ref <30)

## 2016-03-04 MED ORDER — SIMVASTATIN 10 MG PO TABS: 10.0000 mg | ORAL_TABLET | Freq: Every day | ORAL | 0 refills | Status: DC

## 2016-03-04 NOTE — Progress Notes (Signed)
Name: [name] [name]   MRN: [id number]    DOB: 1960-05-08   Date:03/04/2016       Progress Note  Subjective  Chief Complaint  Chief Complaint  Patient presents with  . Follow-up    A1C    Diabetes  She presents for her follow-up diabetic visit. She has type 2 diabetes mellitus. Her disease course has been improving. Pertinent negatives for hypoglycemia include no headaches. Pertinent negatives for diabetes include no blurred vision, no chest pain, no fatigue, no polydipsia and no polyuria. Symptoms are improving. Pertinent negatives for diabetic complications include no CVA. Current diabetic treatment includes oral agent (monotherapy). She is following a generally healthy (She is on a modified diet consisting of pureed and honey-thick liquids, ) diet. She rarely (Sedentary, sits in her wheelchair, has Physical therapy as deemed necessary) participates in exercise. She monitors blood glucose at home 1-2 x per week. Her breakfast blood glucose range is generally 90-110 mg/dl. An ACE inhibitor/angiotensin II receptor blocker is being taken.  Hyperlipidemia  This is a chronic problem. The problem is controlled. Recent lipid tests were reviewed and are normal. Pertinent negatives include no chest pain, myalgias or shortness of breath. Current antihyperlipidemic treatment includes statins.  Hypertension  This is a chronic problem. The problem is unchanged. The problem is controlled. Pertinent negatives include no blurred vision, chest pain, headaches, palpitations or shortness of breath. Risk factors for coronary artery disease include dyslipidemia, diabetes mellitus, obesity and sedentary lifestyle. Past treatments include ACE inhibitors. There is no history of kidney disease, CAD/MI or CVA.    Past Medical History:  Diagnosis Date  . Arthritis   . Borderline intellectual functioning   . Diabetes mellitus without complication ([place])   . Double vision   . Hypertension   . Osteoporosis   . Spastic  quadriparesis ([place])   . 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 Traumatic brain injury [facility])     History reviewed. No pertinent surgical history.  History reviewed. No pertinent family history.  Social History   Social History  . Marital status: Single    Spouse name: N/A  . Number of children: N/A  . Years of education: N/A   Occupational History  . Not on file.   Social History Main Topics  . Smoking status: Former Research scientist (life sciences)  . Smokeless tobacco: Never Used  . Alcohol use No  . Drug use: No  . Sexual activity: Not Currently   Other Topics Concern  . Not on file   Social History Narrative  . No narrative on file     Current Outpatient Prescriptions:  .  ACCU-CHEK AVIVA PLUS test strip, CHECK BLOOD SUGAR TWICE A WEEK, Disp: 100 each, Rfl: PRN .  Alcohol Swabs (B-D SINGLE USE SWABS REGULAR) PADS, USE AS DIRECTED (CHECK BLOOD SUGAR TWICE A WEEK), Disp: 100 each, Rfl: PRN .  alendronate (FOSAMAX) 70 MG tablet, 1 TAB PO WEEKLY EARLY AM BEFORE FOOD/MEDS W/WATER. DON'T LIE DOWN FOR30 MIN (OSTEOPOROSIS) *NO CRUSH*, Disp: 4 tablet, Rfl: 0 .  cholecalciferol (VITAMIN D) 400 units TABS tablet, TAKE ONE TABLET BY MOUTH 2 TIMES A DAY, Disp: 60 tablet, Rfl: 11 .  cilostazol (PLETAL) 50 MG tablet, Take 1 tablet (50 mg total) by mouth 2 (two) times daily., Disp: 60 tablet, Rfl: 5 .  desmopressin (DDAVP) 0.2 MG tablet, Take 1 tablet (200 mcg total) by mouth at bedtime., Disp: 30 tablet, Rfl: 5 .  docusate sodium (COLACE) 100 MG capsule, Take 1 capsule (100  mg total) by mouth 2 (two) times daily., Disp: 60 capsule, Rfl: 5 .  donepezil (ARICEPT) 10 MG tablet, TAKE 1 TABLET BY MOUTH TWICE A DAY (DEMENTIA), Disp: 60 tablet, Rfl: 3 .  ferrous sulfate 325 (65 FE) MG tablet, Take 325 mg by mouth daily., Disp: , Rfl:  .  FLUoxetine (PROZAC) 10 MG tablet, Take 30 mg by mouth every morning., Disp: , Rfl:  .  ketoconazole (NIZORAL) 2 % cream, APPLY TOPICALLY ONCE DAILY, Disp: 15 g, Rfl: 0 .  Lurasidone  HCl 20 MG TABS, Take 1 tablet by mouth every morning., Disp: , Rfl:  .  memantine (NAMENDA XR) 28 MG CP24 24 hr capsule, Take 28 mg by mouth daily., Disp: , Rfl:  .  metFORMIN (GLUCOPHAGE) 500 MG tablet, Take 1 tablet (500 mg total) by mouth daily with breakfast., Disp: 90 tablet, Rfl: 0 .  nortriptyline (PAMELOR) 25 MG capsule, Take 1 capsule (25 mg total) by mouth at bedtime., Disp: 30 capsule, Rfl: 5 .  nystatin cream (MYCOSTATIN), Apply topically as needed for dry skin., Disp: 120 g, Rfl: 0 .  omeprazole (PRILOSEC) 20 MG capsule, Take 1 capsule (20 mg total) by mouth daily., Disp: 30 capsule, Rfl: 5 .  polyethylene glycol powder (GLYCOLAX/MIRALAX) powder, Take 255 g by mouth once., Disp: 3350 g, Rfl: 1 .  ramipril (ALTACE) 10 MG capsule, Take 1 capsule (10 mg total) by mouth daily., Disp: 90 capsule, Rfl: 1 .  simvastatin (ZOCOR) 10 MG tablet, Take 1 tablet (10 mg total) by mouth at bedtime., Disp: 30 tablet, Rfl: 5 .  sitaGLIPtin (JANUVIA) 100 MG tablet, ONE TABLET BY MOUTH ONCE A DAY. (DIABETES), Disp: 90 tablet, Rfl: 1 .  Tea Tree 100 % OIL, Apply 1 application topically daily. To nails, Disp: 60 mL, Rfl: 3 .  tolterodine (DETROL LA) 4 MG 24 hr capsule, TAKE 1 CAPSULE BY MOUTH ONCE DAILY FOR BLADDER. **DO NOT CRUSH**, Disp: 30 capsule, Rfl: 3  Allergies  Allergen Reactions  . Dilantin [Phenytoin] Other (See Comments)    unknown  . Penicillins Other (See Comments)    unknown unknown  . Phenytoin Sodium Extended Other (See Comments)    Other reaction(s): Other (See Comments)  . Phenytoin Sodium Extended Other (See Comments)    Other reaction(s): Other (See Comments)  . Sulfa Antibiotics Rash and Other (See Comments)    unknown     Review of Systems  Constitutional: Negative for fatigue.  Eyes: Negative for blurred vision.  Respiratory: Negative for shortness of breath.   Cardiovascular: Negative for chest pain and palpitations.  Musculoskeletal: Negative for myalgias.   Neurological: Negative for headaches.  Endo/Heme/Allergies: Negative for polydipsia.    Objective  Vitals:   03/04/16 1006  BP: 128/67  Pulse: 98  Resp: 17  Temp: 98.6 F (37 C)  TempSrc: Oral  SpO2: 95%    Physical Exam  Constitutional: She is well-developed, well-nourished, and in no distress. Vital signs are normal.  Age appropriate woman sitting in wheelchair, alert, oriented, Responds to her name,  Cardiovascular: Normal rate, regular rhythm, S1 normal, S2 normal and normal heart sounds.   Pulmonary/Chest: Effort normal and breath sounds normal. No respiratory distress. She has no rhonchi.  Abdominal: Soft. Bowel sounds are normal. There is no tenderness.  Neurological: She is alert.  Psychiatric: Mood normal. She has a flat affect.  Unable to determine memory or judgment  Nursing note and vitals reviewed.       Assessment & Plan  1. Essential hypertension BP stable on present antihypertensive therapy.  2. Type 2 diabetes mellitus without complication, without [place]-term current use of insulin (HCC) Point-of-care A1c is stable and improved at  6.6%, continue on pharmacotherapy as prescribed.  - POCT CBG (Fasting - Glucose) - POCT glycosylated hemoglobin (Hb A1C)  3. Pure hypercholesterolemia  - simvastatin (ZOCOR) 10 MG tablet; Take 1 tablet (10 mg total) by mouth at bedtime.  Dispense: 90 tablet; Refill: 0 - Lipid panel  4. Abnormal urine odor Patient's caregiver will try to bring urine for testing  Rhealynn Myhre [facility] 03/04/2016 10:10 AM

## 2016-03-05 LAB — POCT CBG (FASTING - GLUCOSE)-MANUAL ENTRY: Glucose Fasting, POC: 103 mg/dL — AB (ref 70–99)

## 2016-03-05 LAB — POCT GLYCOSYLATED HEMOGLOBIN (HGB A1C): HEMOGLOBIN A1C: 6.6

## 2016-03-06 ENCOUNTER — Other Ambulatory Visit: Payer: Self-pay

## 2016-03-06 ENCOUNTER — Other Ambulatory Visit: Payer: Self-pay | Admitting: Family Medicine

## 2016-03-06 ENCOUNTER — Other Ambulatory Visit: Payer: Self-pay | Admitting: Emergency Medicine

## 2016-03-06 ENCOUNTER — Encounter: Payer: Self-pay | Admitting: Family Medicine

## 2016-03-06 DIAGNOSIS — R300 Dysuria: Secondary | ICD-10-CM

## 2016-03-06 DIAGNOSIS — N390 Urinary tract infection, site not specified: Secondary | ICD-10-CM | POA: Insufficient documentation

## 2016-03-06 DIAGNOSIS — N3001 Acute cystitis with hematuria: Secondary | ICD-10-CM

## 2016-03-06 DIAGNOSIS — R350 Frequency of micturition: Secondary | ICD-10-CM

## 2016-03-06 LAB — POCT URINALYSIS DIPSTICK
Bilirubin, UA: NEGATIVE
Glucose, UA: NEGATIVE
KETONES UA: NEGATIVE
Nitrite, UA: NEGATIVE
PH UA: 5
SPEC GRAV UA: 1.015
UROBILINOGEN UA: 0.2

## 2016-03-06 MED ORDER — CIPROFLOXACIN HCL 500 MG PO TABS: 500.0000 mg | ORAL_TABLET | Freq: Two times a day (BID) | ORAL | 0 refills | Status: DC

## 2016-03-06 NOTE — Addendum Note (Signed)
Addended by: Rotha Cassels G on: 03/06/2016 03:47 PM   Modules accepted: Orders

## 2016-03-08 LAB — URINE CULTURE

## 2016-03-09 ENCOUNTER — Other Ambulatory Visit: Payer: Self-pay | Admitting: Family Medicine

## 2016-03-09 DIAGNOSIS — N3001 Acute cystitis with hematuria: Secondary | ICD-10-CM

## 2016-03-10 ENCOUNTER — Telehealth: Payer: Self-pay | Admitting: Family Medicine

## 2016-03-10 MED ORDER — NITROFURANTOIN MONOHYD MACRO 100 MG PO CAPS: 100.0000 mg | ORAL_CAPSULE | Freq: Two times a day (BID) | ORAL | 0 refills | Status: AC

## 2016-03-10 NOTE — Telephone Encounter (Signed)
Please call in prescription [facility]i [facility]

## 2016-03-10 NOTE — Progress Notes (Signed)
Changed from Ciprofloxacin to Nitrofurantoin for UTI based on culture and sensitivity.

## 2016-03-11 NOTE — Telephone Encounter (Signed)
Prescription for nitrofurantoin has been sent to patient's pharmacy

## 2016-03-13 DIAGNOSIS — M6281 Muscle weakness (generalized): Secondary | ICD-10-CM | POA: Diagnosis not present

## 2016-03-13 DIAGNOSIS — R2689 Other abnormalities of gait and mobility: Secondary | ICD-10-CM | POA: Diagnosis not present

## 2016-03-16 ENCOUNTER — Ambulatory Visit: Payer: Medicare Other | Admitting: Podiatry

## 2016-03-19 ENCOUNTER — Ambulatory Visit: Payer: Medicare Other | Admitting: Podiatry

## 2016-03-19 DIAGNOSIS — R2689 Other abnormalities of gait and mobility: Secondary | ICD-10-CM | POA: Diagnosis not present

## 2016-03-19 DIAGNOSIS — M6281 Muscle weakness (generalized): Secondary | ICD-10-CM | POA: Diagnosis not present

## 2016-03-26 DIAGNOSIS — R2689 Other abnormalities of gait and mobility: Secondary | ICD-10-CM | POA: Diagnosis not present

## 2016-03-26 DIAGNOSIS — M6281 Muscle weakness (generalized): Secondary | ICD-10-CM | POA: Diagnosis not present

## 2016-03-31 ENCOUNTER — Other Ambulatory Visit: Payer: Self-pay | Admitting: Family Medicine

## 2016-04-09 DIAGNOSIS — M6281 Muscle weakness (generalized): Secondary | ICD-10-CM | POA: Diagnosis not present

## 2016-04-09 DIAGNOSIS — R2689 Other abnormalities of gait and mobility: Secondary | ICD-10-CM | POA: Diagnosis not present

## 2016-04-17 DIAGNOSIS — M6281 Muscle weakness (generalized): Secondary | ICD-10-CM | POA: Diagnosis not present

## 2016-04-17 DIAGNOSIS — R2689 Other abnormalities of gait and mobility: Secondary | ICD-10-CM | POA: Diagnosis not present

## 2016-04-20 ENCOUNTER — Telehealth: Payer: Self-pay | Admitting: Family Medicine

## 2016-04-20 DIAGNOSIS — L601 Onycholysis: Secondary | ICD-10-CM

## 2016-04-20 DIAGNOSIS — B351 Tinea unguium: Secondary | ICD-10-CM

## 2016-04-20 DIAGNOSIS — S90222S Contusion of left lesser toe(s) with damage to nail, sequela: Secondary | ICD-10-CM

## 2016-04-20 NOTE — Telephone Encounter (Signed)
[name] [name] from [facility] is requesting a referral to be sent to Dr Amedeo Gory. 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 And the person that is taking his place is not good with Suanne Marker. [phone]

## 2016-04-23 DIAGNOSIS — R2689 Other abnormalities of gait and mobility: Secondary | ICD-10-CM | POA: Diagnosis not present

## 2016-04-23 DIAGNOSIS — M6281 Muscle weakness (generalized): Secondary | ICD-10-CM | POA: Diagnosis not present

## 2016-04-24 NOTE — Telephone Encounter (Signed)
Okay to place the referral with appropriate diagnosis.

## 2016-04-28 ENCOUNTER — Emergency Department
Admission: EM | Admit: 2016-04-28 | Discharge: 2016-04-28 | Disposition: A | Discharge status: Home / Self Care | Payer: Medicare Other | Attending: Emergency Medicine | Admitting: Emergency Medicine

## 2016-04-28 DIAGNOSIS — Z79899 Other long term (current) drug therapy: Secondary | ICD-10-CM | POA: Diagnosis not present

## 2016-04-28 DIAGNOSIS — Z7984 Long term (current) use of oral hypoglycemic drugs: Secondary | ICD-10-CM | POA: Insufficient documentation

## 2016-04-28 DIAGNOSIS — Z711 Person with feared health complaint in whom no diagnosis is made: Secondary | ICD-10-CM | POA: Diagnosis not present

## 2016-04-28 DIAGNOSIS — I10 Essential (primary) hypertension: Secondary | ICD-10-CM | POA: Insufficient documentation

## 2016-04-28 DIAGNOSIS — E119 Type 2 diabetes mellitus without complications: Secondary | ICD-10-CM | POA: Insufficient documentation

## 2016-04-28 DIAGNOSIS — Z87891 Personal history of nicotine dependence: Secondary | ICD-10-CM | POA: Diagnosis not present

## 2016-04-28 NOTE — ED Provider Notes (Signed)
[facility] Emergency Department Provider Note ____________________________________________  Time seen: Approximately 11:15 PM  I have reviewed the triage vital signs and the nursing notes.   HISTORY  Chief Complaint Fall    HPI [name] [name] is a 56 y.o. female who presents to the emergency department with her caretaker to be "checked out."Caregiver states that she was getting her ready for a shower and while strapped onto the shower chair it tipped over. The company requires that she now have a medical evaluation. Patient denies any complaints. There was no head strike or loss of consciousness. Patient struck the right side of her body on the wall, but there has been no decrease in range of motion/function of the right arm.  Past Medical History:  Diagnosis Date  . Arthritis   . Borderline intellectual functioning   . Diabetes mellitus without complication ([place])   . Double vision   . Hypertension   . Osteoporosis   . 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[place])   . 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 Traumatic brain injury [facility])     Patient Active Problem List   Diagnosis Date Noted  . UTI (urinary tract infection) 03/06/2016  . Dysphagia 12/11/2015  . Type 2 diabetes mellitus ([place]) 12/03/2014  . Traumatic brain injury ([place]) 12/03/2014  . Dehydration 08/23/2014  . Chronic constipation 08/23/2014  . Clinical depression 08/23/2014  . Diabetes mellitus, type 2 ([place]) 08/23/2014  . BP (high blood pressure) 08/23/2014  . Injury brain, traumatic ([place]) 08/23/2014  . OP (osteoporosis) 08/23/2014  . HLD (hyperlipidemia) 08/23/2014  . Chest pain syndrome 08/23/2014  . Cognitive impairment 08/23/2014    No past surgical history on file.  Prior to Admission medications   Medication Sig Start Date End Date Taking? Authorizing Provider  ACCU-CHEK AVIVA PLUS test strip CHECK BLOOD SUGAR TWICE A WEEK 09/16/15   Steele Sizer, MD  Alcohol Swabs (B-D SINGLE USE SWABS  REGULAR) PADS USE AS DIRECTED (CHECK BLOOD SUGAR TWICE A WEEK) 09/16/15   Steele Sizer, MD  alendronate (FOSAMAX) 70 MG tablet 1 TAB PO WEEKLY EARLY AM BEFORE FOOD/MEDS W/WATER. DON'T LIE DOWN FOR30 MIN (OSTEOPOROSIS) *NO CRUSH* 08/13/15   Roselee Nova, MD  cholecalciferol (VITAMIN D) 400 units TABS tablet TAKE ONE TABLET BY MOUTH 2 TIMES A DAY 01/07/16   Roselee Nova, MD  cilostazol (PLETAL) 50 MG tablet Take 1 tablet (50 mg total) by mouth 2 (two) times daily. 10/03/15   Roselee Nova, MD  desmopressin (DDAVP) 0.2 MG tablet Take 1 tablet (200 mcg total) by mouth at bedtime. 10/03/15   Roselee Nova, MD  donepezil (ARICEPT) 10 MG tablet TAKE 1 TABLET BY MOUTH TWICE A DAY (DEMENTIA) 11/22/15   Roselee Nova, MD  ferrous sulfate 325 (65 FE) MG tablet Take 325 mg by mouth daily.    Historical Provider, MD  FLUoxetine (PROZAC) 10 MG tablet Take 30 mg by mouth every morning.    Historical Provider, MD  ketoconazole (NIZORAL) 2 % cream APPLY TOPICALLY ONCE DAILY 12/23/15   Landis Martins, DPM  Lurasidone HCl 20 MG TABS Take 1 tablet by mouth every morning.    Historical Provider, MD  memantine (NAMENDA XR) 28 MG CP24 24 hr capsule Take 28 mg by mouth daily.    Historical Provider, MD  metFORMIN (GLUCOPHAGE) 500 MG tablet Take 1 tablet (500 mg total) by mouth daily with breakfast. 11/22/15   Roselee Nova, MD  nortriptyline (  PAMELOR) 25 MG capsule Take 1 capsule (25 mg total) by mouth at bedtime. 10/03/15   Roselee Nova, MD  nystatin cream (MYCOSTATIN) Apply topically as needed for dry skin. 10/17/14   Ashok Norris, MD  omeprazole (PRILOSEC) 20 MG capsule Take 1 capsule (20 mg total) by mouth daily. 10/03/15   Roselee Nova, MD  polyethylene glycol powder (GLYCOLAX/MIRALAX) powder Take 255 g by mouth once. 10/17/14   Ashok Norris, MD  ramipril (ALTACE) 10 MG capsule Take 1 capsule (10 mg total) by mouth daily. 08/13/15   Roselee Nova, MD  simvastatin (ZOCOR) 10 MG tablet Take 1  tablet (10 mg total) by mouth at bedtime. 03/04/16   Roselee Nova, MD  sitaGLIPtin (JANUVIA) 100 MG tablet ONE TABLET BY MOUTH ONCE A DAY. (DIABETES) 08/13/15   Roselee Nova, MD  STOOL SOFTENER 100 MG capsule TAKE 1 CAPSULE BY MOUTH TWICE A DAY. (STOOL SOFTNER) (TAKE WITH GLASSWATER) 03/31/16   Roselee Nova, MD  Tea Tree 100 % OIL Apply 1 application topically daily. To nails 08/30/15   Landis Martins, DPM  tolterodine (DETROL LA) 4 MG 24 hr capsule TAKE 1 CAPSULE BY MOUTH ONCE DAILY FOR BLADDER. **DO NOT CRUSH** 11/22/15   Roselee Nova, MD    Allergies Dilantin [phenytoin]; Penicillins; Phenytoin sodium extended; Phenytoin sodium extended; and Sulfa antibiotics  No family history on file.  Social History Social History  Substance Use Topics  . Smoking status: Former Research scientist (life sciences)  . Smokeless tobacco: Never Used  . Alcohol use No    Review of Systems Constitutional: No recent illness. Cardiovascular: Denies chest pain or palpitations. Respiratory: Denies shortness of breath. Musculoskeletal: Denies pain Skin: Negative for rash, wound, lesion. Neurological: Negative for focal weakness or numbness.  ____________________________________________   PHYSICAL EXAM:  VITAL SIGNS: ED Triage Vitals  Enc Vitals Group     BP 04/28/16 2219 134/83     Pulse Rate 04/28/16 2219 86     Resp 04/28/16 2219 18     Temp 04/28/16 2219 97.8 F (36.6 C)     Temp Source 04/28/16 2219 Oral     SpO2 04/28/16 2219 99 %     Weight 04/28/16 2217 190 lb (86.2 kg)     Height --      Head Circumference --      Peak Flow --      Pain Score --      Pain Loc --      Pain Edu? --      Excl. in [place]? --     Constitutional: Alert and oriented. Well appearing and in no acute distress. Eyes: Conjunctivae are normal. EOMI. Head: Atraumatic. Neck: No stridor.  Respiratory: Normal respiratory effort.   Musculoskeletal: No tenderness to palpation over the neck or back. No tenderness over the right  shoulder, elbow, or wrist. No tenderness over the abdomen. No tenderness over the right hip, thigh, knee, or lower leg. Neurologic:  Normal speech and language. No gross focal neurologic deficits are appreciated. Speech is normal. No gait instability. Skin:  Skin is warm, dry and intact. Atraumatic. Psychiatric: Mood and affect are normal. Speech and behavior are normal.  ____________________________________________   LABS (all labs ordered are listed, but only abnormal results are displayed)  Labs Reviewed - No data to display ____________________________________________  RADIOLOGY  Not indicated ____________________________________________   PROCEDURES  Procedure(s) performed: None  ____________________________________________   INITIAL IMPRESSION / ASSESSMENT AND  PLAN / ED COURSE  56 year old female presenting to the emergency department due to falling against the wall while in her shower chair. She is a resident at a group home who requires evaluation after any injury such as this. Patient has no complaints and there are no acute exam findings. Caregiver was instructed to have her follow up with primary care if she begins to have pain or starts to complain. They were advised to return to the emergency department for symptoms that change or worsen if they're unable schedule an appointment.  Pertinent labs & imaging results that were available during my care of the patient were reviewed by me and considered in my medical decision making (see chart for details).  _________________________________________   FINAL CLINICAL IMPRESSION(S) / ED DIAGNOSES  Final diagnoses:  No problem, feared complaint unfounded    Discharge Medication List as of 04/28/2016 11:26 PM      If controlled substance prescribed during this visit, 12 month history viewed on the [facility] prior to issuing an initial prescription for Schedule II or III opiod.    Victorino Dike, FNP 05/01/16 [place], MD 05/01/16 [phone]

## 2016-04-28 NOTE — ED Triage Notes (Signed)
Pt to triage via w/c with no distress noted; pt from group home, caregiver reports pt getting ready for shower and fell over on shower chair and "company wants her checked out"; pt denies any c/o

## 2016-04-28 NOTE — Telephone Encounter (Signed)
Referral has been placed with the correct diagnosis and sent to Dr. Sharlotte Alamo at [facility].

## 2016-04-30 ENCOUNTER — Ambulatory Visit: Payer: Medicare Other | Admitting: Family Medicine

## 2016-05-01 ENCOUNTER — Ambulatory Visit: Payer: Medicare Other | Admitting: Family Medicine

## 2016-05-01 DIAGNOSIS — M6281 Muscle weakness (generalized): Secondary | ICD-10-CM | POA: Diagnosis not present

## 2016-05-01 DIAGNOSIS — R2689 Other abnormalities of gait and mobility: Secondary | ICD-10-CM | POA: Diagnosis not present

## 2016-05-05 ENCOUNTER — Encounter: Payer: Self-pay | Admitting: Family Medicine

## 2016-05-05 ENCOUNTER — Ambulatory Visit (INDEPENDENT_AMBULATORY_CARE_PROVIDER_SITE_OTHER): Payer: Medicare Other | Admitting: Family Medicine

## 2016-05-05 VITALS — BP 137/71 | HR 105 | Temp 98.6°F | Resp 17

## 2016-05-05 DIAGNOSIS — R32 Unspecified urinary incontinence: Secondary | ICD-10-CM | POA: Diagnosis not present

## 2016-05-05 NOTE — Progress Notes (Signed)
Name: [name] [name]   MRN: [id number]    DOB: 01-07-1961   Date:05/05/2016       Progress Note  Subjective  Chief Complaint  Chief Complaint  Patient presents with  . Urinary Tract Infection    HPI  Pt. is brought in by a staff member from the [facility] group home with concerns for possible UTI. Patient goes to the day program at [facility] and was picked up from [facility] where she had apparently urinated on herself. Here, patient is not able to provide a urine sample but denies any burning on urination, no reports of blood in urine, no symptoms of urgency or fevers.    Past Medical History:  Diagnosis Date  . Arthritis   . Borderline intellectual functioning   . Diabetes mellitus without complication ([place])   . Double vision   . Hypertension   . Osteoporosis   . 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[place])   . 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 Traumatic brain injury [facility])     History reviewed. No pertinent surgical history.  History reviewed. No pertinent family history.  Social History   Social History  . Marital status: Single    Spouse name: N/A  . Number of children: N/A  . Years of education: N/A   Occupational History  . Not on file.   Social History Main Topics  . Smoking status: Former Research scientist (life sciences)  . Smokeless tobacco: Never Used  . Alcohol use No  . Drug use: No  . Sexual activity: Not Currently   Other Topics Concern  . Not on file   Social History Narrative  . No narrative on file     Current Outpatient Prescriptions:  .  ACCU-CHEK AVIVA PLUS test strip, CHECK BLOOD SUGAR TWICE A WEEK, Disp: 100 each, Rfl: PRN .  Alcohol Swabs (B-D SINGLE USE SWABS REGULAR) PADS, USE AS DIRECTED (CHECK BLOOD SUGAR TWICE A WEEK), Disp: 100 each, Rfl: PRN .  alendronate (FOSAMAX) 70 MG tablet, 1 TAB PO WEEKLY EARLY AM BEFORE FOOD/MEDS W/WATER. DON'T LIE DOWN FOR30 MIN (OSTEOPOROSIS) *NO CRUSH*, Disp: 4 tablet, Rfl: 0 .  cholecalciferol (VITAMIN D) 400 units TABS tablet, TAKE ONE  TABLET BY MOUTH 2 TIMES A DAY, Disp: 60 tablet, Rfl: 11 .  cilostazol (PLETAL) 50 MG tablet, Take 1 tablet (50 mg total) by mouth 2 (two) times daily., Disp: 60 tablet, Rfl: 5 .  desmopressin (DDAVP) 0.2 MG tablet, Take 1 tablet (200 mcg total) by mouth at bedtime., Disp: 30 tablet, Rfl: 5 .  donepezil (ARICEPT) 10 MG tablet, TAKE 1 TABLET BY MOUTH TWICE A DAY (DEMENTIA), Disp: 60 tablet, Rfl: 3 .  ferrous sulfate 325 (65 FE) MG tablet, Take 325 mg by mouth daily., Disp: , Rfl:  .  FLUoxetine (PROZAC) 10 MG tablet, Take 30 mg by mouth every morning., Disp: , Rfl:  .  ketoconazole (NIZORAL) 2 % cream, APPLY TOPICALLY ONCE DAILY, Disp: 15 g, Rfl: 0 .  Lurasidone HCl 20 MG TABS, Take 1 tablet by mouth every morning., Disp: , Rfl:  .  memantine (NAMENDA XR) 28 MG CP24 24 hr capsule, Take 28 mg by mouth daily., Disp: , Rfl:  .  metFORMIN (GLUCOPHAGE) 500 MG tablet, Take 1 tablet (500 mg total) by mouth daily with breakfast., Disp: 90 tablet, Rfl: 0 .  nortriptyline (PAMELOR) 25 MG capsule, Take 1 capsule (25 mg total) by mouth at bedtime., Disp: 30 capsule, Rfl: 5 .  nystatin cream (MYCOSTATIN), Apply topically  as needed for dry skin., Disp: 120 g, Rfl: 0 .  omeprazole (PRILOSEC) 20 MG capsule, Take 1 capsule (20 mg total) by mouth daily., Disp: 30 capsule, Rfl: 5 .  polyethylene glycol powder (GLYCOLAX/MIRALAX) powder, Take 255 g by mouth once., Disp: 3350 g, Rfl: 1 .  ramipril (ALTACE) 10 MG capsule, Take 1 capsule (10 mg total) by mouth daily., Disp: 90 capsule, Rfl: 1 .  simvastatin (ZOCOR) 10 MG tablet, Take 1 tablet (10 mg total) by mouth at bedtime., Disp: 90 tablet, Rfl: 0 .  sitaGLIPtin (JANUVIA) 100 MG tablet, ONE TABLET BY MOUTH ONCE A DAY. (DIABETES), Disp: 90 tablet, Rfl: 1 .  STOOL SOFTENER 100 MG capsule, TAKE 1 CAPSULE BY MOUTH TWICE A DAY. (STOOL SOFTNER) (TAKE WITH GLASSWATER), Disp: 60 capsule, Rfl: 4 .  Tea Tree 100 % OIL, Apply 1 application topically daily. To nails, Disp: 60 mL,  Rfl: 3 .  tolterodine (DETROL LA) 4 MG 24 hr capsule, TAKE 1 CAPSULE BY MOUTH ONCE DAILY FOR BLADDER. **DO NOT CRUSH**, Disp: 30 capsule, Rfl: 3  Allergies  Allergen Reactions  . Dilantin [Phenytoin] Other (See Comments)    unknown  . Penicillins Other (See Comments)    unknown unknown  . Phenytoin Sodium Extended Other (See Comments)    Other reaction(s): Other (See Comments)  . Phenytoin Sodium Extended Other (See Comments)    Other reaction(s): Other (See Comments)  . Sulfa Antibiotics Rash and Other (See Comments)    unknown     Review of Systems  Constitutional: Negative for chills and fever.  Gastrointestinal: Negative for abdominal pain.  Genitourinary: Negative for dysuria, flank pain, frequency and hematuria.      Objective  Vitals:   05/05/16 1344  BP: 137/71  Pulse: (!) 105  Resp: 17  Temp: 98.6 F (37 C)  TempSrc: Oral  SpO2: 97%    Physical Exam  Constitutional: She is well-developed, well-nourished, and in no distress.  Patient has a large area of whiteness on her pants, smells strongly of urine  Cardiovascular: Normal rate, regular rhythm, S1 normal, S2 normal and normal heart sounds.   No murmur heard. Pulmonary/Chest: Effort normal and breath sounds normal. No respiratory distress. She has no wheezes. She has no rhonchi.  Abdominal: Soft. Bowel sounds are normal. There is no tenderness. There is no CVA tenderness.  Neurological: She is alert.  Skin: Skin is warm.  Psychiatric: Mood, affect and judgment normal.   memory unable to be determined.  Nursing note and vitals reviewed.      Assessment & Plan  1. Urinary incontinence, unspecified type Suspect UTI, however patient is unable to provide a urine sample today, the staff members report that patient has incontinence multiple times in the past, likely overflow incontinence. They will try to bring a urine sample for testing today or tomorrow, patient may need a referral to urology.   [name]  [facility] 05/05/2016 1:52 PM

## 2016-05-08 DIAGNOSIS — M6281 Muscle weakness (generalized): Secondary | ICD-10-CM | POA: Diagnosis not present

## 2016-05-08 DIAGNOSIS — R2689 Other abnormalities of gait and mobility: Secondary | ICD-10-CM | POA: Diagnosis not present

## 2016-05-11 ENCOUNTER — Other Ambulatory Visit: Payer: Self-pay | Admitting: Family Medicine

## 2016-05-11 DIAGNOSIS — Z1231 Encounter for screening mammogram for malignant neoplasm of breast: Secondary | ICD-10-CM

## 2016-05-12 ENCOUNTER — Telehealth: Payer: Self-pay | Admitting: Family Medicine

## 2016-05-12 NOTE — Telephone Encounter (Signed)
[name] [name] from [facility] checking status on incontinence supplies. It was originally faxed on 04-13-16 and is only good for 2 days before it is no longer any good. And since then it has been faxed several other times. I have asked her to refax the forms just in case. Please return her call to discuss [phone]

## 2016-05-14 DIAGNOSIS — R2689 Other abnormalities of gait and mobility: Secondary | ICD-10-CM | POA: Diagnosis not present

## 2016-05-14 DIAGNOSIS — M6281 Muscle weakness (generalized): Secondary | ICD-10-CM | POA: Diagnosis not present

## 2016-05-14 NOTE — Telephone Encounter (Signed)
Paperwork for incontinence supplies has been faxed on 05/13/2016

## 2016-05-18 ENCOUNTER — Telehealth: Payer: Self-pay | Admitting: Family Medicine

## 2016-05-18 NOTE — Telephone Encounter (Signed)
[facility] SAID THAT THEY TALKED WITH YOU LAST WEEK ABOUT FORMS THAT [facility] FILLED OUT BUT FORGOT TO SIGN AND YOU TOLD HER THAT HE WAS OUT TILL Monday AND YOU WOULD GET IT SIGNED THEN. IF NEED TO CALL [phone] AND HER NAME IS EMILY.

## 2016-05-20 NOTE — Telephone Encounter (Signed)
Forms for [place] [employer] prior approval has been faxed to [facility] Elicia Lamp) on 05/20/2016

## 2016-05-21 DIAGNOSIS — R2689 Other abnormalities of gait and mobility: Secondary | ICD-10-CM | POA: Diagnosis not present

## 2016-05-21 DIAGNOSIS — M6281 Muscle weakness (generalized): Secondary | ICD-10-CM | POA: Diagnosis not present

## 2016-05-28 DIAGNOSIS — M6281 Muscle weakness (generalized): Secondary | ICD-10-CM | POA: Diagnosis not present

## 2016-05-28 DIAGNOSIS — R2689 Other abnormalities of gait and mobility: Secondary | ICD-10-CM | POA: Diagnosis not present

## 2016-06-02 DIAGNOSIS — F339 Major depressive disorder, recurrent, unspecified: Secondary | ICD-10-CM | POA: Diagnosis not present

## 2016-06-03 ENCOUNTER — Ambulatory Visit: Payer: Medicare Other | Admitting: Family Medicine

## 2016-06-04 ENCOUNTER — Ambulatory Visit: Payer: Medicare Other | Admitting: Family Medicine

## 2016-06-04 DIAGNOSIS — M79675 Pain in left toe(s): Secondary | ICD-10-CM | POA: Diagnosis not present

## 2016-06-04 DIAGNOSIS — L309 Dermatitis, unspecified: Secondary | ICD-10-CM | POA: Diagnosis not present

## 2016-06-04 DIAGNOSIS — B351 Tinea unguium: Secondary | ICD-10-CM | POA: Diagnosis not present

## 2016-06-04 DIAGNOSIS — M79674 Pain in right toe(s): Secondary | ICD-10-CM | POA: Diagnosis not present

## 2016-06-04 DIAGNOSIS — E119 Type 2 diabetes mellitus without complications: Secondary | ICD-10-CM | POA: Diagnosis not present

## 2016-06-09 ENCOUNTER — Ambulatory Visit: Payer: Medicare Other | Admitting: Family Medicine

## 2016-06-09 DIAGNOSIS — S5011XA Contusion of right forearm, initial encounter: Secondary | ICD-10-CM | POA: Diagnosis not present

## 2016-06-09 DIAGNOSIS — M79631 Pain in right forearm: Secondary | ICD-10-CM | POA: Diagnosis not present

## 2016-06-09 DIAGNOSIS — S59911A Unspecified injury of right forearm, initial encounter: Secondary | ICD-10-CM | POA: Diagnosis not present

## 2016-06-09 DIAGNOSIS — M79601 Pain in right arm: Secondary | ICD-10-CM | POA: Diagnosis not present

## 2016-06-17 ENCOUNTER — Ambulatory Visit: Payer: Medicare Other | Attending: Family Medicine

## 2016-06-25 ENCOUNTER — Ambulatory Visit: Payer: Medicare Other | Admitting: Family Medicine

## 2016-07-01 ENCOUNTER — Other Ambulatory Visit: Payer: Self-pay | Admitting: Family Medicine

## 2016-07-01 DIAGNOSIS — E7800 Pure hypercholesterolemia, unspecified: Secondary | ICD-10-CM

## 2016-07-01 DIAGNOSIS — IMO0001 Reserved for inherently not codable concepts without codable children: Secondary | ICD-10-CM

## 2016-07-01 DIAGNOSIS — E1165 Type 2 diabetes mellitus with hyperglycemia: Secondary | ICD-10-CM

## 2016-07-01 DIAGNOSIS — E1121 Type 2 diabetes mellitus with diabetic nephropathy: Secondary | ICD-10-CM

## 2016-07-01 DIAGNOSIS — Z794 Long term (current) use of insulin: Principal | ICD-10-CM

## 2016-07-13 ENCOUNTER — Ambulatory Visit: Payer: Medicare Other

## 2016-07-16 ENCOUNTER — Other Ambulatory Visit: Payer: Self-pay | Admitting: Family Medicine

## 2016-07-16 DIAGNOSIS — E7800 Pure hypercholesterolemia, unspecified: Secondary | ICD-10-CM

## 2016-07-16 DIAGNOSIS — E1165 Type 2 diabetes mellitus with hyperglycemia: Secondary | ICD-10-CM

## 2016-07-16 DIAGNOSIS — Z794 Long term (current) use of insulin: Secondary | ICD-10-CM

## 2016-07-16 DIAGNOSIS — IMO0001 Reserved for inherently not codable concepts without codable children: Secondary | ICD-10-CM

## 2016-07-16 DIAGNOSIS — E1121 Type 2 diabetes mellitus with diabetic nephropathy: Secondary | ICD-10-CM

## 2016-07-17 NOTE — Telephone Encounter (Signed)
Reviewed last sgpt, Cr, rxs approved

## 2016-07-28 ENCOUNTER — Ambulatory Visit
Admission: RE | Admit: 2016-07-28 | Discharge: 2016-07-28 | Disposition: A | Discharge status: Home / Self Care | Payer: Medicare Other | Source: Ambulatory Visit | Attending: Family Medicine | Admitting: Family Medicine

## 2016-07-28 DIAGNOSIS — Z1231 Encounter for screening mammogram for malignant neoplasm of breast: Secondary | ICD-10-CM | POA: Insufficient documentation

## 2016-07-28 LAB — HM MAMMOGRAPHY

## 2016-07-31 ENCOUNTER — Other Ambulatory Visit: Payer: Self-pay | Admitting: Family Medicine

## 2016-08-03 ENCOUNTER — Other Ambulatory Visit: Payer: Self-pay | Admitting: Family Medicine

## 2016-08-03 MED ORDER — PROBIOTIC-10 PO CAPS: 1.0000 | ORAL_CAPSULE | Freq: Every day | ORAL | 0 refills | Status: DC

## 2016-08-03 NOTE — Telephone Encounter (Signed)
Request came through for probiotics; Rx approved

## 2016-08-03 NOTE — Telephone Encounter (Signed)
Complex patient with several medicines Will approve just a few days of refills until primary returns

## 2016-08-10 ENCOUNTER — Telehealth: Payer: Self-pay | Admitting: Family Medicine

## 2016-08-10 NOTE — Telephone Encounter (Signed)
KGOVPCHE Simi from [facility] is requesting an order for Thick-It. Please return call [phone]

## 2016-08-11 NOTE — Telephone Encounter (Signed)
Dr. Manuella Ghazi, Marisue Brooklyn Fedorko from [facility] is requesting an order for Thick-It. Please return call [phone] ext [phone]. Please advise

## 2016-08-12 NOTE — Telephone Encounter (Signed)
I was unable to contact the staff at [facility]. Please call the facility and I will be glad to speak with the concerned party.

## 2016-08-15 ENCOUNTER — Inpatient Hospital Stay
Admission: EM | Admit: 2016-08-15 | Discharge: 2016-08-21 | DRG: 871 | Disposition: A | Discharge status: Skilled Nursing Facility | Payer: Medicare Other | Attending: Internal Medicine | Admitting: Internal Medicine

## 2016-08-15 ENCOUNTER — Emergency Department: Payer: Medicare Other

## 2016-08-15 ENCOUNTER — Encounter: Payer: Self-pay | Admitting: Medical Oncology

## 2016-08-15 DIAGNOSIS — F79 Unspecified intellectual disabilities: Secondary | ICD-10-CM | POA: Diagnosis present

## 2016-08-15 DIAGNOSIS — B952 Enterococcus as the cause of diseases classified elsewhere: Secondary | ICD-10-CM | POA: Diagnosis present

## 2016-08-15 DIAGNOSIS — Z79899 Other long term (current) drug therapy: Secondary | ICD-10-CM

## 2016-08-15 DIAGNOSIS — Z87891 Personal history of nicotine dependence: Secondary | ICD-10-CM | POA: Diagnosis not present

## 2016-08-15 DIAGNOSIS — A419 Sepsis, unspecified organism: Secondary | ICD-10-CM

## 2016-08-15 DIAGNOSIS — G8250 Quadriplegia, unspecified: Secondary | ICD-10-CM | POA: Diagnosis present

## 2016-08-15 DIAGNOSIS — N390 Urinary tract infection, site not specified: Secondary | ICD-10-CM | POA: Diagnosis present

## 2016-08-15 DIAGNOSIS — K219 Gastro-esophageal reflux disease without esophagitis: Secondary | ICD-10-CM | POA: Diagnosis not present

## 2016-08-15 DIAGNOSIS — Z7984 Long term (current) use of oral hypoglycemic drugs: Secondary | ICD-10-CM | POA: Diagnosis not present

## 2016-08-15 DIAGNOSIS — D649 Anemia, unspecified: Secondary | ICD-10-CM | POA: Diagnosis present

## 2016-08-15 DIAGNOSIS — K5900 Constipation, unspecified: Secondary | ICD-10-CM | POA: Diagnosis not present

## 2016-08-15 DIAGNOSIS — Z882 Allergy status to sulfonamides status: Secondary | ICD-10-CM | POA: Diagnosis not present

## 2016-08-15 DIAGNOSIS — I10 Essential (primary) hypertension: Secondary | ICD-10-CM | POA: Diagnosis not present

## 2016-08-15 DIAGNOSIS — S069X9A Unspecified intracranial injury with loss of consciousness of unspecified duration, initial encounter: Secondary | ICD-10-CM | POA: Diagnosis present

## 2016-08-15 DIAGNOSIS — E785 Hyperlipidemia, unspecified: Secondary | ICD-10-CM | POA: Diagnosis present

## 2016-08-15 DIAGNOSIS — M810 Age-related osteoporosis without current pathological fracture: Secondary | ICD-10-CM | POA: Diagnosis present

## 2016-08-15 DIAGNOSIS — Z880 Allergy status to penicillin: Secondary | ICD-10-CM | POA: Diagnosis not present

## 2016-08-15 DIAGNOSIS — Z87820 Personal history of traumatic brain injury: Secondary | ICD-10-CM

## 2016-08-15 DIAGNOSIS — N3281 Overactive bladder: Secondary | ICD-10-CM | POA: Diagnosis present

## 2016-08-15 DIAGNOSIS — N329 Bladder disorder, unspecified: Secondary | ICD-10-CM | POA: Diagnosis not present

## 2016-08-15 DIAGNOSIS — S069XAA Unspecified intracranial injury with loss of consciousness status unknown, initial encounter: Secondary | ICD-10-CM | POA: Diagnosis present

## 2016-08-15 DIAGNOSIS — Z888 Allergy status to other drugs, medicaments and biological substances status: Secondary | ICD-10-CM | POA: Diagnosis not present

## 2016-08-15 DIAGNOSIS — F0390 Unspecified dementia without behavioral disturbance: Secondary | ICD-10-CM | POA: Diagnosis present

## 2016-08-15 DIAGNOSIS — F3289 Other specified depressive episodes: Secondary | ICD-10-CM | POA: Diagnosis not present

## 2016-08-15 DIAGNOSIS — R739 Hyperglycemia, unspecified: Secondary | ICD-10-CM | POA: Diagnosis not present

## 2016-08-15 DIAGNOSIS — E119 Type 2 diabetes mellitus without complications: Secondary | ICD-10-CM | POA: Diagnosis not present

## 2016-08-15 DIAGNOSIS — R1312 Dysphagia, oropharyngeal phase: Secondary | ICD-10-CM | POA: Diagnosis not present

## 2016-08-15 DIAGNOSIS — E1165 Type 2 diabetes mellitus with hyperglycemia: Secondary | ICD-10-CM | POA: Diagnosis present

## 2016-08-15 DIAGNOSIS — R41841 Cognitive communication deficit: Secondary | ICD-10-CM | POA: Diagnosis not present

## 2016-08-15 DIAGNOSIS — Z736 Limitation of activities due to disability: Secondary | ICD-10-CM | POA: Diagnosis not present

## 2016-08-15 DIAGNOSIS — Z7401 Bed confinement status: Secondary | ICD-10-CM | POA: Diagnosis not present

## 2016-08-15 DIAGNOSIS — Z5189 Encounter for other specified aftercare: Secondary | ICD-10-CM | POA: Diagnosis not present

## 2016-08-15 DIAGNOSIS — M6281 Muscle weakness (generalized): Secondary | ICD-10-CM | POA: Diagnosis not present

## 2016-08-15 DIAGNOSIS — R6520 Severe sepsis without septic shock: Secondary | ICD-10-CM

## 2016-08-15 DIAGNOSIS — R0602 Shortness of breath: Secondary | ICD-10-CM

## 2016-08-15 DIAGNOSIS — S06300S Unspecified focal traumatic brain injury without loss of consciousness, sequela: Secondary | ICD-10-CM | POA: Diagnosis not present

## 2016-08-15 DIAGNOSIS — F329 Major depressive disorder, single episode, unspecified: Secondary | ICD-10-CM | POA: Diagnosis present

## 2016-08-15 DIAGNOSIS — E118 Type 2 diabetes mellitus with unspecified complications: Secondary | ICD-10-CM | POA: Diagnosis not present

## 2016-08-15 LAB — CBC
HCT: 42.6 % (ref 35.0–47.0)
HEMOGLOBIN: 14.2 g/dL (ref 12.0–16.0)
MCH: 29.7 pg (ref 26.0–34.0)
MCHC: 33.3 g/dL (ref 32.0–36.0)
MCV: 89.1 fL (ref 80.0–100.0)
Platelets: 361 10*3/uL (ref 150–440)
RBC: 4.79 MIL/uL (ref 3.80–5.20)
RDW: 13.3 % (ref 11.5–14.5)
WBC: 27.1 10*3/uL — AB (ref 3.6–11.0)

## 2016-08-15 LAB — HEPATIC FUNCTION PANEL
ALBUMIN: 4 g/dL (ref 3.5–5.0)
ALT: 20 U/L (ref 14–54)
AST: 23 U/L (ref 15–41)
Alkaline Phosphatase: 96 U/L (ref 38–126)
Bilirubin, Direct: <0.1 mg/dL — ABNORMAL LOW (ref 0.1–0.5)
TOTAL PROTEIN: 7.7 g/dL (ref 6.5–8.1)
Total Bilirubin: 0.6 mg/dL (ref 0.3–1.2)

## 2016-08-15 LAB — TROPONIN I: Troponin I: <0.03 ng/mL (ref <0.03)

## 2016-08-15 LAB — CBC WITH DIFFERENTIAL/PLATELET
BASOS ABS: 0 10*3/uL (ref 0–0.1)
BASOS PCT: 0 %
EOS PCT: 0 %
Eosinophils Absolute: 0 10*3/uL (ref 0–0.7)
HEMATOCRIT: 43.4 % (ref 35.0–47.0)
Hemoglobin: 14.1 g/dL (ref 12.0–16.0)
Lymphocytes Relative: 5 %
Lymphs Abs: 1.4 10*3/uL (ref 1.0–3.6)
MCH: 28.9 pg (ref 26.0–34.0)
MCHC: 32.5 g/dL (ref 32.0–36.0)
MCV: 89 fL (ref 80.0–100.0)
MONO ABS: 1.1 10*3/uL — AB (ref 0.2–0.9)
Monocytes Relative: 4 %
NEUTROS ABS: 24.2 10*3/uL — AB (ref 1.4–6.5)
Neutrophils Relative %: 91 %
PLATELETS: 366 10*3/uL (ref 150–440)
RBC: 4.88 MIL/uL (ref 3.80–5.20)
RDW: 13.3 % (ref 11.5–14.5)
WBC: 26.8 10*3/uL — ABNORMAL HIGH (ref 3.6–11.0)

## 2016-08-15 LAB — URINALYSIS, COMPLETE (UACMP) WITH MICROSCOPIC
BILIRUBIN URINE: NEGATIVE
Bacteria, UA: NONE SEEN
GLUCOSE, UA: 150 mg/dL — AB
HGB URINE DIPSTICK: NEGATIVE
KETONES UR: NEGATIVE mg/dL
LEUKOCYTES UA: NEGATIVE
NITRITE: NEGATIVE
PH: 5 (ref 5.0–8.0)
Protein, ur: NEGATIVE mg/dL
RBC / HPF: NONE SEEN RBC/hpf (ref 0–5)
SPECIFIC GRAVITY, URINE: 1.018 (ref 1.005–1.030)
Squamous Epithelial / LPF: NONE SEEN

## 2016-08-15 LAB — MAGNESIUM: MAGNESIUM: 1.7 mg/dL (ref 1.7–2.4)

## 2016-08-15 LAB — PROTIME-INR
INR: 0.97
PROTHROMBIN TIME: 12.9 s (ref 11.4–15.2)

## 2016-08-15 LAB — LACTIC ACID, PLASMA
Lactic Acid, Venous: 2.7 mmol/L (ref 0.5–1.9)
Lactic Acid, Venous: 2.5 mmol/L (ref 0.5–1.9)

## 2016-08-15 LAB — BASIC METABOLIC PANEL
Anion gap: 11 (ref 5–15)
BUN: 51 mg/dL — AB (ref 6–20)
CALCIUM: 10.3 mg/dL (ref 8.9–10.3)
CO2: 24 mmol/L (ref 22–32)
CREATININE: 1.1 mg/dL — AB (ref 0.44–1.00)
Chloride: 99 mmol/L — ABNORMAL LOW (ref 101–111)
GFR calc Af Amer: >60 mL/min (ref >60)
GFR, EST NON AFRICAN AMERICAN: 55 mL/min — AB (ref >60)
Glucose, Bld: 308 mg/dL — ABNORMAL HIGH (ref 65–99)
POTASSIUM: 4.9 mmol/L (ref 3.5–5.1)
SODIUM: 134 mmol/L — AB (ref 135–145)

## 2016-08-15 LAB — LIPASE, BLOOD: LIPASE: 19 U/L (ref 11–51)

## 2016-08-15 LAB — GLUCOSE, CAPILLARY
GLUCOSE-CAPILLARY: 145 mg/dL — AB (ref 65–99)
Glucose-Capillary: 252 mg/dL — ABNORMAL HIGH (ref 65–99)

## 2016-08-15 LAB — PHOSPHORUS: PHOSPHORUS: 3.5 mg/dL (ref 2.5–4.6)

## 2016-08-15 LAB — PROCALCITONIN: PROCALCITONIN: 0.15 ng/mL

## 2016-08-15 LAB — APTT

## 2016-08-15 MED ORDER — HEPARIN SODIUM (PORCINE) 5000 UNIT/ML IJ SOLN
5000.0000 [IU] | Freq: Three times a day (TID) | INTRAMUSCULAR | Status: DC
  Administered 2016-08-15 – 2016-08-21 (×17): 5000 [IU] via SUBCUTANEOUS
  Filled 2016-08-15 (×18): qty 1

## 2016-08-15 MED ORDER — LINAGLIPTIN 5 MG PO TABS
5.0000 mg | ORAL_TABLET | Freq: Every day | ORAL | Status: DC
  Administered 2016-08-16 – 2016-08-21 (×6): 5 mg via ORAL
  Filled 2016-08-15 (×6): qty 1

## 2016-08-15 MED ORDER — SODIUM CHLORIDE 0.9 % IV SOLN
INTRAVENOUS | Status: DC
  Administered 2016-08-15: 23:00:00 via INTRAVENOUS

## 2016-08-15 MED ORDER — RAMIPRIL 10 MG PO CAPS
10.0000 mg | ORAL_CAPSULE | Freq: Two times a day (BID) | ORAL | Status: DC
  Administered 2016-08-15: 10 mg via ORAL
  Filled 2016-08-15 (×2): qty 1

## 2016-08-15 MED ORDER — IPRATROPIUM-ALBUTEROL 0.5-2.5 (3) MG/3ML IN SOLN
3.0000 mL | Freq: Four times a day (QID) | RESPIRATORY_TRACT | Status: DC
  Administered 2016-08-16: 3 mL via RESPIRATORY_TRACT

## 2016-08-15 MED ORDER — FLUOXETINE HCL 20 MG PO TABS
30.0000 mg | ORAL_TABLET | ORAL | Status: DC
  Filled 2016-08-15: qty 2

## 2016-08-15 MED ORDER — LEVOFLOXACIN IN D5W 750 MG/150ML IV SOLN
750.0000 mg | INTRAVENOUS | Status: DC
  Administered 2016-08-16 – 2016-08-18 (×3): 750 mg via INTRAVENOUS
  Filled 2016-08-15 (×4): qty 150

## 2016-08-15 MED ORDER — LORAZEPAM 2 MG/ML IJ SOLN: 0.5000 mg | INTRAMUSCULAR | Status: DC | PRN

## 2016-08-15 MED ORDER — LEVOFLOXACIN IN D5W 750 MG/150ML IV SOLN
750.0000 mg | Freq: Once | INTRAVENOUS | Status: AC
  Administered 2016-08-15: 750 mg via INTRAVENOUS
  Filled 2016-08-15: qty 150

## 2016-08-15 MED ORDER — FAMOTIDINE IN NACL 20-0.9 MG/50ML-% IV SOLN
20.0000 mg | Freq: Two times a day (BID) | INTRAVENOUS | Status: DC
  Administered 2016-08-15 – 2016-08-17 (×5): 20 mg via INTRAVENOUS
  Filled 2016-08-15 (×7): qty 50

## 2016-08-15 MED ORDER — NORTRIPTYLINE HCL 25 MG PO CAPS
25.0000 mg | ORAL_CAPSULE | Freq: Every day | ORAL | Status: DC
  Administered 2016-08-15 – 2016-08-20 (×6): 25 mg via ORAL
  Filled 2016-08-15 (×7): qty 1

## 2016-08-15 MED ORDER — FERROUS SULFATE 325 (65 FE) MG PO TABS
325.0000 mg | ORAL_TABLET | Freq: Every day | ORAL | Status: DC
Start: 2016-08-16 — End: 2016-08-21
  Administered 2016-08-16 – 2016-08-21 (×6): 325 mg via ORAL
  Filled 2016-08-15 (×6): qty 1

## 2016-08-15 MED ORDER — DEXTROSE 5 % IV SOLN
2.0000 g | Freq: Three times a day (TID) | INTRAVENOUS | Status: DC
  Administered 2016-08-16 (×2): 2 g via INTRAVENOUS
  Filled 2016-08-15 (×4): qty 2

## 2016-08-15 MED ORDER — SODIUM CHLORIDE 0.9 % IV BOLUS (SEPSIS)
1000.0000 mL | Freq: Once | INTRAVENOUS | Status: AC
  Administered 2016-08-15: 1000 mL via INTRAVENOUS

## 2016-08-15 MED ORDER — MORPHINE SULFATE (PF) 2 MG/ML IV SOLN: 2.0000 mg | INTRAVENOUS | Status: DC | PRN

## 2016-08-15 MED ORDER — PANTOPRAZOLE SODIUM 40 MG PO TBEC
40.0000 mg | DELAYED_RELEASE_TABLET | Freq: Every day | ORAL | Status: DC
  Administered 2016-08-16 – 2016-08-21 (×6): 40 mg via ORAL
  Filled 2016-08-15 (×6): qty 1

## 2016-08-15 MED ORDER — HYDRALAZINE HCL 20 MG/ML IJ SOLN: 10.0000 mg | INTRAMUSCULAR | Status: DC | PRN

## 2016-08-15 MED ORDER — DESMOPRESSIN ACETATE 0.2 MG PO TABS
200.0000 ug | ORAL_TABLET | Freq: Every day | ORAL | Status: DC
  Administered 2016-08-15 – 2016-08-20 (×6): 200 ug via ORAL
  Filled 2016-08-15 (×7): qty 1

## 2016-08-15 MED ORDER — CALCIUM CARBONATE-VITAMIN D 500-200 MG-UNIT PO TABS
1.0000 | ORAL_TABLET | Freq: Two times a day (BID) | ORAL | Status: DC
  Administered 2016-08-15 – 2016-08-21 (×12): 1 via ORAL
  Filled 2016-08-15 (×12): qty 1

## 2016-08-15 MED ORDER — SIMVASTATIN 20 MG PO TABS
10.0000 mg | ORAL_TABLET | Freq: Every day | ORAL | Status: DC
  Administered 2016-08-15 – 2016-08-20 (×6): 10 mg via ORAL
  Filled 2016-08-15 (×6): qty 1

## 2016-08-15 MED ORDER — VANCOMYCIN HCL IN DEXTROSE 750-5 MG/150ML-% IV SOLN
750.0000 mg | Freq: Two times a day (BID) | INTRAVENOUS | Status: DC
  Administered 2016-08-16: 750 mg via INTRAVENOUS
  Filled 2016-08-15 (×2): qty 150

## 2016-08-15 MED ORDER — ONDANSETRON HCL 4 MG PO TABS: 4.0000 mg | ORAL_TABLET | Freq: Four times a day (QID) | ORAL | Status: DC | PRN

## 2016-08-15 MED ORDER — INSULIN ASPART 100 UNIT/ML ~~LOC~~ SOLN
0.0000 [IU] | Freq: Three times a day (TID) | SUBCUTANEOUS | Status: DC
  Administered 2016-08-16 – 2016-08-17 (×4): 1 [IU] via SUBCUTANEOUS
  Administered 2016-08-17: 2 [IU] via SUBCUTANEOUS
  Administered 2016-08-18 – 2016-08-19 (×5): 1 [IU] via SUBCUTANEOUS
  Administered 2016-08-20 – 2016-08-21 (×2): 2 [IU] via SUBCUTANEOUS
  Filled 2016-08-15 (×11): qty 1

## 2016-08-15 MED ORDER — DEXTROSE 5 % IV SOLN
10.0000 mg/kg | INTRAVENOUS | Status: AC
  Administered 2016-08-15: 860 mg via INTRAVENOUS
  Filled 2016-08-15: qty 17.2

## 2016-08-15 MED ORDER — LURASIDONE HCL 40 MG PO TABS
20.0000 mg | ORAL_TABLET | ORAL | Status: DC
  Administered 2016-08-16 – 2016-08-21 (×6): 20 mg via ORAL
  Filled 2016-08-15 (×7): qty 1

## 2016-08-15 MED ORDER — MEMANTINE HCL ER 28 MG PO CP24
28.0000 mg | ORAL_CAPSULE | Freq: Every day | ORAL | Status: DC
  Administered 2016-08-16 – 2016-08-21 (×6): 28 mg via ORAL
  Filled 2016-08-15 (×6): qty 1

## 2016-08-15 MED ORDER — ACETAMINOPHEN 325 MG PO TABS: 650.0000 mg | ORAL_TABLET | Freq: Four times a day (QID) | ORAL | Status: DC | PRN

## 2016-08-15 MED ORDER — METFORMIN HCL 500 MG PO TABS
500.0000 mg | ORAL_TABLET | Freq: Every day | ORAL | Status: DC
  Administered 2016-08-16 – 2016-08-21 (×6): 500 mg via ORAL
  Filled 2016-08-15 (×7): qty 1

## 2016-08-15 MED ORDER — LIDOCAINE HCL (PF) 1 % IJ SOLN
5.0000 mL | Freq: Once | INTRAMUSCULAR | Status: AC
  Administered 2016-08-15: 5 mL via INTRADERMAL
  Filled 2016-08-15: qty 5

## 2016-08-15 MED ORDER — DOCUSATE SODIUM 100 MG PO CAPS
100.0000 mg | ORAL_CAPSULE | Freq: Two times a day (BID) | ORAL | Status: DC
  Administered 2016-08-15 – 2016-08-21 (×12): 100 mg via ORAL
  Filled 2016-08-15 (×12): qty 1

## 2016-08-15 MED ORDER — DONEPEZIL HCL 5 MG PO TABS
10.0000 mg | ORAL_TABLET | Freq: Two times a day (BID) | ORAL | Status: DC
  Administered 2016-08-15 – 2016-08-21 (×12): 10 mg via ORAL
  Filled 2016-08-15 (×13): qty 2

## 2016-08-15 MED ORDER — DONEPEZIL HCL 10 MG PO TABS
10.0000 mg | ORAL_TABLET | Freq: Two times a day (BID) | ORAL | Status: DC
  Filled 2016-08-15: qty 1

## 2016-08-15 MED ORDER — ACETAMINOPHEN 650 MG RE SUPP: 650.0000 mg | Freq: Four times a day (QID) | RECTAL | Status: DC | PRN

## 2016-08-15 MED ORDER — BISACODYL 10 MG RE SUPP: 10.0000 mg | Freq: Every day | RECTAL | Status: DC | PRN

## 2016-08-15 MED ORDER — CILOSTAZOL 100 MG PO TABS
50.0000 mg | ORAL_TABLET | Freq: Two times a day (BID) | ORAL | Status: DC
  Administered 2016-08-15 – 2016-08-21 (×12): 50 mg via ORAL
  Filled 2016-08-15: qty 1
  Filled 2016-08-15 (×3): qty 0.5
  Filled 2016-08-15: qty 1
  Filled 2016-08-15 (×3): qty 0.5
  Filled 2016-08-15 (×2): qty 1
  Filled 2016-08-15: qty 0.5
  Filled 2016-08-15: qty 1
  Filled 2016-08-15: qty 0.5

## 2016-08-15 MED ORDER — ONDANSETRON HCL 4 MG/2ML IJ SOLN: 4.0000 mg | Freq: Four times a day (QID) | INTRAMUSCULAR | Status: DC | PRN

## 2016-08-15 MED ORDER — FLUOXETINE HCL 20 MG PO CAPS
30.0000 mg | ORAL_CAPSULE | ORAL | Status: DC
  Administered 2016-08-16 – 2016-08-21 (×6): 30 mg via ORAL
  Filled 2016-08-15 (×7): qty 1

## 2016-08-15 MED ORDER — DEXTROSE 5 % IV SOLN
2.0000 g | Freq: Once | INTRAVENOUS | Status: AC
  Administered 2016-08-15: 2 g via INTRAVENOUS
  Filled 2016-08-15: qty 2

## 2016-08-15 MED ORDER — VANCOMYCIN HCL IN DEXTROSE 1-5 GM/200ML-% IV SOLN
1000.0000 mg | Freq: Once | INTRAVENOUS | Status: AC
  Administered 2016-08-15: 1000 mg via INTRAVENOUS
  Filled 2016-08-15: qty 200

## 2016-08-15 NOTE — ED Notes (Signed)
Assisted Dr. Joni Fears with LP, pt tolerated well, however, unable to collect samples.  VSS throughout procedure.

## 2016-08-15 NOTE — ED Notes (Signed)
Pt holding for transport opportunity.

## 2016-08-15 NOTE — ED Provider Notes (Signed)
[facility] Emergency Department Provider Note  ____________________________________________  Time seen: Approximately 4:43 PM  I have reviewed the triage vital signs and the nursing notes.   HISTORY  Chief Complaint Hyperglycemia  Level 5 caveat:  Portions of the history and physical were unable to be obtained due to: Poor historian due to chronic TBI Additional history obtained from caregiver bedside  HPI [name] [name] is a 56 y.o. female who is been low energy for the past few days, and today the patient was unable to even hold herself standing up using a grab bar. Has had a cough at home for the past 2 days as well. No notable change in urination. Mental status seems intact according to the caregiver, the patient still recognizes her. No apparent pain complaints. Patient denies any pain but does feel short of breath.     Past Medical History:  Diagnosis Date  . Arthritis   . Borderline intellectual functioning   . Diabetes mellitus without complication ([place])   . Double vision   . Hypertension   . Osteoporosis   . 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[place])   . 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 Traumatic brain injury [facility])      Patient Active Problem List   Diagnosis Date Noted  . UTI (urinary tract infection) 03/06/2016  . Dysphagia 12/11/2015  . Type 2 diabetes mellitus ([place]) 12/03/2014  . Traumatic brain injury ([place]) 12/03/2014  . Dehydration 08/23/2014  . Chronic constipation 08/23/2014  . Clinical depression 08/23/2014  . Diabetes mellitus, type 2 ([place]) 08/23/2014  . BP (high blood pressure) 08/23/2014  . Injury brain, traumatic ([place]) 08/23/2014  . OP (osteoporosis) 08/23/2014  . HLD (hyperlipidemia) 08/23/2014  . Chest pain syndrome 08/23/2014  . Cognitive impairment 08/23/2014     History reviewed. No pertinent surgical history.   Prior to Admission medications   Medication Sig Start Date End Date Taking? Authorizing Provider  alendronate  (FOSAMAX) 70 MG tablet 1 TAB PO WEEKLY EARLY AM BEFORE FOOD/MEDS W/WATER. DON'T LIE DOWN FOR30 MIN (OSTEOPOROSIS) *NO CRUSH* 08/03/16  Yes Lada, Satira Anis, MD  calcium-vitamin D (OSCAL WITH D) 500-200 MG-UNIT tablet Take 1 tablet by mouth 2 (two) times daily.   Yes [provider]  cholecalciferol (VITAMIN D) 400 units TABS tablet TAKE ONE TABLET BY MOUTH 2 TIMES A DAY 01/07/16  Yes Roselee Nova, MD  cilostazol (PLETAL) 50 MG tablet Take 1 tablet (50 mg total) by mouth 2 (two) times daily. 10/03/15  Yes Rochel Brome A, MD  desmopressin (DDAVP) 0.2 MG tablet TAKE 1 TABLET BY MOUTH AT BEDTIME. 08/03/16  Yes Lada, Satira Anis, MD  donepezil (ARICEPT) 10 MG tablet TAKE 1 TABLET BY MOUTH TWICE A DAY (DEMENTIA) 08/03/16  Yes Lada, Satira Anis, MD  ferrous sulfate 325 (65 FE) MG tablet Take 325 mg by mouth daily.   Yes [provider]  FLUoxetine (PROZAC) 10 MG tablet Take 30 mg by mouth every morning.   Yes [provider]  JANUVIA 100 MG tablet ONE TABLET BY MOUTH ONCE A DAY. (DIABETES) 07/17/16  Yes Lada, Satira Anis, MD  ketoconazole (NIZORAL) 2 % cream APPLY TOPICALLY ONCE DAILY 12/23/15  Yes Stover, Titorya, DPM  Lurasidone HCl 20 MG TABS Take 1 tablet by mouth every morning.   Yes [provider]  memantine (NAMENDA XR) 28 MG CP24 24 hr capsule Take 28 mg by mouth daily.   Yes [provider]  metFORMIN (GLUCOPHAGE) 500 MG tablet  TAKE ONE TABLET BY MOUTH EVERY DAY WITH BREAKFAST 07/17/16  Yes Lada, Satira Anis, MD  nortriptyline (PAMELOR) 25 MG capsule Take 1 capsule (25 mg total) by mouth at bedtime. 08/03/16  Yes Lada, Satira Anis, MD  nystatin cream (MYCOSTATIN) Apply topically as needed for dry skin. 10/17/14  Yes Ashok Norris, MD  omeprazole (PRILOSEC) 20 MG capsule Take 1 capsule (20 mg total) by mouth daily. 10/03/15  Yes Roselee Nova, MD  Probiotic Product (PROBIOTIC-10) CAPS Take 1 capsule by mouth daily. 08/03/16  Yes Lada, Satira Anis, MD  ramipril  (ALTACE) 10 MG capsule TAKE 1 CAPSULE BY MOUTH EVERY DAY 08/03/16  Yes Lada, Satira Anis, MD  simvastatin (ZOCOR) 10 MG tablet TAKE ONE TABLET BY MOUTH AT BEDTIME. 07/17/16  Yes Lada, Satira Anis, MD  STOOL SOFTENER 100 MG capsule TAKE 1 CAPSULE BY MOUTH TWICE A DAY. (STOOL SOFTNER) (TAKE WITH GLASSWATER) 03/31/16  Yes Roselee Nova, MD  Tea Tree 100 % OIL Apply 1 application topically daily. To nails 08/30/15  Yes Stover, Titorya, DPM  tolterodine (DETROL LA) 4 MG 24 hr capsule TAKE 1 CAPSULE BY MOUTH ONCE DAILY FOR BLADDER. **DO NOT CRUSH** 08/03/16  Yes Lada, Satira Anis, MD  ACCU-CHEK AVIVA PLUS test strip CHECK BLOOD SUGAR TWICE A WEEK 09/16/15   Ancil Boozer, Drue Stager, MD  Alcohol Swabs (B-D SINGLE USE SWABS REGULAR) PADS USE AS DIRECTED (CHECK BLOOD SUGAR TWICE A WEEK) 09/16/15   Sowles, Drue Stager, MD  polyethylene glycol powder (GLYCOLAX/MIRALAX) powder Take 255 g by mouth once. Patient not taking: Reported on 08/15/2016 10/17/14   Ashok Norris, MD     Allergies Dilantin [phenytoin]; Penicillins; Phenytoin sodium extended; Phenytoin sodium extended; and Sulfa antibiotics   No family history on file.  Social History Social History  Substance Use Topics  . Smoking status: Former Research scientist (life sciences)  . Smokeless tobacco: Never Used  . Alcohol use No    Review of Systems Unable to reliably obtained due to poor historian secondary to chronic TBI  ____________________________________________   PHYSICAL EXAM:  VITAL SIGNS: ED Triage Vitals [08/15/16 1512]  Enc Vitals Group     BP 90/64     Pulse Rate (!) 118     Resp 18     Temp 97.8 F (36.6 C)     Temp Source Oral     SpO2 98 %     Weight 190 lb (86.2 kg)     Height 5\' 5"  (1.651 m)     Head Circumference      Peak Flow      Pain Score      Pain Loc      Pain Edu?      Excl. in [place]?     Vital signs reviewed, nursing assessments reviewed.   Constitutional:   Alert and oriented To self. Not aware that she is in the hospital. Ill appearing. Not  in distress. Eyes:   No scleral icterus.  EOMI. No nystagmus. No conjunctival pallor. PERRL. ENT   Head:   Normocephalic and atraumatic.   Nose:   No congestion/rhinnorhea.    Mouth/Throat:   Dry mucous membranes, no pharyngeal erythema. No peritonsillar mass.    Neck:   No meningismus. Full ROM Hematological/Lymphatic/Immunilogical:   No cervical lymphadenopathy. Cardiovascular:   Tachycardia heart rate 120. Symmetric thready bilateral radial and DP pulses.  No murmurs.  Respiratory:   Normal respiratory effort without tachypnea/retractions. Breath sounds rhonchorous diffusely bilaterally. Patient has a congested cough on exam.  Gastrointestinal:   Soft and nontender. Non distended. There is no CVA tenderness.  No rebound, rigidity, or guarding. Genitourinary:   deferred Musculoskeletal:   Normal range of motion in all extremities. No joint effusions.  No lower extremity tenderness.  No edema. Neurologic:   Baseline language and speech.  Motor grossly intact. No gross focal neurologic deficits are appreciated.  Skin:    Skin is warm, dry and intact. No rash noted.  No petechiae, purpura, or bullae.  ____________________________________________    LABS (pertinent positives/negatives) (all labs ordered are listed, but only abnormal results are displayed) Labs Reviewed  BASIC METABOLIC PANEL - Abnormal; Notable for the following:       Result Value   Sodium 134 (*)    Chloride 99 (*)    Glucose, Bld 308 (*)    BUN 51 (*)    Creatinine, Ser 1.10 (*)    GFR calc non Af Amer 55 (*)    All other components within normal limits  CBC - Abnormal; Notable for the following:    WBC 27.1 (*)    All other components within normal limits  URINALYSIS, COMPLETE (UACMP) WITH MICROSCOPIC - Abnormal; Notable for the following:    Color, Urine YELLOW (*)    APPearance CLEAR (*)    Glucose, UA 150 (*)    All other components within normal limits  GLUCOSE, CAPILLARY - Abnormal; Notable  for the following:    Glucose-Capillary 252 (*)    All other components within normal limits  LACTIC ACID, PLASMA - Abnormal; Notable for the following:    Lactic Acid, Venous 2.7 (*)    All other components within normal limits  APTT - Abnormal; Notable for the following:    aPTT <24 (*)    All other components within normal limits  CBC WITH DIFFERENTIAL/PLATELET - Abnormal; Notable for the following:    WBC 26.8 (*)    Neutro Abs 24.2 (*)    Monocytes Absolute 1.1 (*)    All other components within normal limits  HEPATIC FUNCTION PANEL - Abnormal; Notable for the following:    Bilirubin, Direct <0.1 (*)    All other components within normal limits  CULTURE, BLOOD (ROUTINE X 2)  CULTURE, BLOOD (ROUTINE X 2)  URINE CULTURE  TROPONIN I  PROCALCITONIN  PROTIME-INR  LIPASE, BLOOD  MAGNESIUM  PHOSPHORUS  LACTIC ACID, PLASMA  CBG MONITORING, ED   ____________________________________________   EKG  Interpreted by me Sinus tachycardia rate 108, normal axis and intervals. Normal QRS ST segments and T waves.  ____________________________________________    RADIOLOGY  Dg Chest Port 1 View  Result Date: 08/15/2016 CLINICAL DATA:  Hyperglycemia, history of traumatic brain injury, hypertension, diabetes mellitus EXAM: PORTABLE CHEST 1 VIEW COMPARISON:  Portable exam 1659 hours compared to 04/20/2015 FINDINGS: Rotated to the LEFT. Normal heart size, mediastinal contours, and pulmonary vascularity. Lungs clear. No pleural effusion or pneumothorax. BILATERAL glenohumeral degenerative changes and diffuse osseous demineralization. IMPRESSION: No acute abnormalities. Electronically Signed   By: Lavonia Dana M.D.   On: 08/15/2016 17:32    ____________________________________________   PROCEDURES Procedures CRITICAL CARE Performed by: Joni Fears, Avie Checo   Total critical care time: 35 minutes  Critical care time was exclusive of separately billable procedures and treating other  patients.  Critical care was necessary to treat or prevent imminent or life-threatening deterioration.  Critical care was time spent personally by me on the following activities: development of treatment plan with patient and/or surrogate as well as nursing,  discussions with consultants, evaluation of patient's response to treatment, examination of patient, obtaining history from patient or surrogate, ordering and performing treatments and interventions, ordering and review of laboratory studies, ordering and review of radiographic studies, pulse oximetry and re-evaluation of patient's condition.   Peripheral IV insertion by physician Indication: Multiple failed attempts by nursing staff, need for IV access and/or blood samples for workup Performed under continuous real-time ultrasound visualization Area cleaned with chlorhexidine. 20-gauge IV successfully placed in the right antecubital fossa. 1 attempt, no complications, EBL 0.  LUMBAR PUNCTURE  Date/Time: 08/15/2016 at 7:07 PM Performed by: Carrie Mew  Consent: Verbal consent obtained. Written consent obtained. Risks and benefits: risks, benefits and alternatives were discussed Consent given by: Melina Schools, guardian, via phone. I personally discussed procedure with her Patient understanding: patient is disoriented and has limited ability to understand the current circumstances  Patient consent: the patient's guardians understanding of the procedure matches consent given  Procedure consent: procedure consent matches procedure scheduled  Relevant documents: relevant documents present and verified  Test results: test results available and properly labeled Site marked: the operative site was marked Imaging studies: imaging studies available  Required items: required blood products, implants, devices, and special equipment available  Patient identity confirmed: verbally with patient and arm band  Time out: Immediately prior to  procedure a "time out" was called to verify the correct patient, procedure, equipment, support staff and site/side marked as required.  Indications: Suspected meningitis  Anesthesia: local infiltration Local anesthetic: lidocaine 1% without epinephrine Anesthetic total: 4 ml Patient sedated: no Analgesia: none Preparation: Patient was prepped and draped in the usual sterile fashion. Lumbar space: L3-L4 interspace Patient's position: Right lateral decubitus Needle gauge: 20 Needle length: 3.5 in Number of attempts: 3 Procedure unsuccessful. Patient unable to participate in procedure and maintain suitable positioning, maintaining a lordosis position of the lumbar spine. Because of this, spinal canal remained too deep to the skin for access with 3.5 inch needle  Post-procedure: site cleaned and adhesive bandage applied Patient tolerance: Patient tolerated the procedure well with no immediate complications   ____________________________________________   INITIAL IMPRESSION / ASSESSMENT AND PLAN / ED COURSE  Pertinent labs & imaging results that were available during my care of the patient were reviewed by me and considered in my medical decision making (see chart for details).  Patient presents with tachycardia, white blood cell count of 27,000, ill-appearing and low energy with confusion, clinically with perfusion deficit in extremities with thready pulses, suspected pneumonia, likely to require hospitalization. Code sepsis protocol initiated immediately upon initial assessment, after immediate steps for resuscitation were completed including history exam and peripheral IV placement. We'll follow-up additional labs urinalysis chest x-ray, give empiric antibiotics. With the shortness of breath and tachycardia, I did consider additional cardiopulmonary pathology such as ACS PE dissection pericarditis or pneumothorax, but on initial assessment these possibilities seem unlikely.  Clinical  Course as of Aug 16 1934  Sat Aug 15, 2016  1725 BP improving with IVF  [PS]  1803 Initial workup negative for source. Elevated lactate. Large ANC. Abd. Benign. Will proceed with LP. D/w guardian Melina Schools [phone] who gives verbal consent over the phone. She will inform pt's sister as well. Procedure d/w patient, who gives verbal assent as well, although she is confused, oriented to self only.   [PS]  1904 LP procedure unsuccessful.  Will continue sepsis management.  Abdomen re-examined, benign.   [PS]  1917 Sepsis recheck completed at 7:00pm  [PS]  1927 D/w neurology Zeylikman, recommends empiric tx until IR is availble, possibly not until Monday. Would not see any benefit to transfer at this time.   [PS]    Clinical Course User Index [PS] Carrie Mew, MD    ----------------------------------------- 7:05 PM on 08/15/2016 -----------------------------------------  Sepsis - Repeat Assessment  Performed at:    19:00  Vitals     Blood pressure (!) 138/101, pulse (!) 117, temperature 97.8 F (36.6 C), temperature source Oral, resp. rate 18, height 5\' 5"  (1.651 m), weight 86.2 kg (190 lb), SpO2 99 %.  Heart:     Tachycardic  Lungs:    Rhonchi  Capillary Refill:   <2 sec  Peripheral Pulse:   Radial pulse palpable  Skin:     Normal Color Pulse strenght improved, 2+. Clinically improved peripheral perfusion .     ----------------------------------------- 7:36 PM on 08/15/2016 -----------------------------------------  Discussed with hospitalist for further management  ____________________________________________   FINAL CLINICAL IMPRESSION(S) / ED DIAGNOSES  Final diagnoses:  Sepsis, due to unspecified organism [facility])  Hyperglycemia      New Prescriptions   No medications on file     Portions of this note were generated with dragon dictation software. Dictation errors may occur despite best attempts at proofreading.    Carrie Mew, MD 08/15/16  [phone]

## 2016-08-15 NOTE — Consult Note (Signed)
Pharmacy Antibiotic Note  [name] [name] is a 56 y.o. female admitted on 08/15/2016 with sepsis and meningitis.  Pharmacy has been consulted for vancomycin, aztreonam and levofloxacin dosing. Pt with unknown PCN allergy. Please note however, pt has tolerated ceftriaxone in the past. Pt received 1 dose of acyclovir in ED. Dr. Doy Hutching does not wish to continue. Unable to get a LP  Plan: Vancomycin 1 g was given in ED. Will give next dose in 6 hours for stacked dosing. Will start vancomycin 750mg  q 12 hours Trough prior to the 5th dose goal 15-20 Aztreonam 2g q 8 hours Levofloxacin 750mg  q 24 hours  Height: 5\' 5"  (165.1 cm) Weight: 190 lb (86.2 kg) IBW/kg (Calculated) : 57  Temp (24hrs), Avg:97.8 F (36.6 C), Min:97.8 F (36.6 C), Max:97.8 F (36.6 C)   Recent Labs Lab 08/15/16 1517 08/15/16 1635 08/15/16 1700 08/15/16 1912  WBC 27.1*  --  26.8*  --   CREATININE 1.10*  --   --   --   LATICACIDVEN  --  2.7*  --  2.5*    Estimated Creatinine Clearance: 61.2 mL/min (A) (by C-G formula based on SCr of 1.1 mg/dL (H)).    Allergies  Allergen Reactions  . Dilantin [Phenytoin] Other (See Comments)    unknown  . Penicillins Other (See Comments)    .Has patient had a PCN reaction causing immediate rash, facial/tongue/throat swelling, SOB or lightheadedness with hypotension: Unknown Has patient had a PCN reaction causing severe rash involving mucus membranes or skin necrosis: Unknown Has patient had a PCN reaction that required hospitalization: Unknown Has patient had a PCN reaction occurring within the last 10 years: Unknown If all of the above answers are "NO", then may proceed with Cephalosporin use.   Marland Kitchen Phenytoin Sodium Extended Other (See Comments)    Other reaction(s): Other (See Comments)  . Phenytoin Sodium Extended Other (See Comments)    Other reaction(s): Other (See Comments)  . Sulfa Antibiotics Rash and Other (See Comments)    unknown    Antimicrobials this  admission: levofloxacin 7/21 >>  aztreonam 7/21 >>  Vancomycin 7/21>> Acyclovir one dose  Dose adjustments this admission:   Microbiology results: 7/21 BCx:  7/21 UCx:     Thank you for allowing pharmacy to be a part of this patient's care.  Ramond Dial 08/15/2016 8:56 PM

## 2016-08-15 NOTE — ED Notes (Signed)
MD Joni Fears made aware of repeat Lactic acid level of 2.5. CBG checked by this RN 147.

## 2016-08-15 NOTE — ED Notes (Signed)
Received call from [facility] re: pt failing atbx and blood culture collection.  Blood cultures were collected at 1620 and 1635 respectively.  Pt did not fail as the blood cultures were collected before any antibiotic was collected.

## 2016-08-15 NOTE — H&P (Signed)
History and Physical    [name] [name] KXF:[id number] DOB: Sep 29, 1960 DOA: 08/15/2016  Referring physician: Dr. Joni Fears PCP: Roselee Nova, MD  Specialists: none  Chief Complaint: weakness  HPI: [name] [name] is a 56 y.o. female has a past medical history significant for TBI, HTN, and DM who presents to ER with weakness and anorexia. In ER, pt was mildly hypoxic with WBC=27K. Procalcitonin elevated c/w sepsis. CXR and UA negative. LP attempted in ER but not successful. She is now admitted. Denies fever. No N/V/D. Denies chest or abdominal pain.  Review of Systems: The patient denies  fever, weight loss,, vision loss, decreased hearing, hoarseness, chest pain, syncope, dyspnea on exertion, peripheral edema, balance deficits, hemoptysis, abdominal pain, melena, hematochezia, severe indigestion/heartburn, hematuria, incontinence, genital sores,, suspicious skin lesions, transient blindness, difficulty walking, depression, unusual weight change, abnormal bleeding, enlarged lymph nodes, angioedema, and breast masses.   Past Medical History:  Diagnosis Date  . Arthritis   . Borderline intellectual functioning   . Diabetes mellitus without complication ([place])   . Double vision   . Hypertension   . Osteoporosis   . 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[place])   . 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 Traumatic brain injury [facility])    History reviewed. No pertinent surgical history. Social History:  reports that she has quit smoking. She has never used smokeless tobacco. She reports that she does not drink alcohol or use drugs.  Allergies  Allergen Reactions  . Dilantin [Phenytoin] Other (See Comments)    unknown  . Penicillins Other (See Comments)    .Has patient had a PCN reaction causing immediate rash, facial/tongue/throat swelling, SOB or lightheadedness with hypotension: Unknown Has patient had a PCN reaction causing severe rash involving mucus membranes or skin necrosis: Unknown Has patient had a  PCN reaction that required hospitalization: Unknown Has patient had a PCN reaction occurring within the last 10 years: Unknown If all of the above answers are "NO", then may proceed with Cephalosporin use.   Marland Kitchen Phenytoin Sodium Extended Other (See Comments)    Other reaction(s): Other (See Comments)  . Phenytoin Sodium Extended Other (See Comments)    Other reaction(s): Other (See Comments)  . Sulfa Antibiotics Rash and Other (See Comments)    unknown    History reviewed. No pertinent family history.  Prior to Admission medications   Medication Sig Start Date End Date Taking? Authorizing Provider  alendronate (FOSAMAX) 70 MG tablet 1 TAB PO WEEKLY EARLY AM BEFORE FOOD/MEDS W/WATER. DON'T LIE DOWN FOR30 MIN (OSTEOPOROSIS) *NO CRUSH* 08/03/16  Yes Lada, Satira Anis, MD  calcium-vitamin D (OSCAL WITH D) 500-200 MG-UNIT tablet Take 1 tablet by mouth 2 (two) times daily.   Yes [provider]  cholecalciferol (VITAMIN D) 400 units TABS tablet TAKE ONE TABLET BY MOUTH 2 TIMES A DAY 01/07/16  Yes Roselee Nova, MD  cilostazol (PLETAL) 50 MG tablet Take 1 tablet (50 mg total) by mouth 2 (two) times daily. 10/03/15  Yes Rochel Brome A, MD  desmopressin (DDAVP) 0.2 MG tablet TAKE 1 TABLET BY MOUTH AT BEDTIME. 08/03/16  Yes Lada, Satira Anis, MD  donepezil (ARICEPT) 10 MG tablet TAKE 1 TABLET BY MOUTH TWICE A DAY (DEMENTIA) 08/03/16  Yes Lada, Satira Anis, MD  ferrous sulfate 325 (65 FE) MG tablet Take 325 mg by mouth daily.   Yes [provider]  FLUoxetine (PROZAC) 10 MG tablet Take 30 mg by mouth every morning.   Yes [provider]  JANUVIA 100 MG tablet ONE TABLET BY MOUTH ONCE A DAY. (DIABETES) 07/17/16  Yes Lada, Satira Anis, MD  ketoconazole (NIZORAL) 2 % cream APPLY TOPICALLY ONCE DAILY 12/23/15  Yes Stover, Titorya, DPM  Lurasidone HCl 20 MG TABS Take 1 tablet by mouth every morning.   Yes [provider]  memantine (NAMENDA XR) 28 MG CP24 24 hr capsule Take 28 mg  by mouth daily.   Yes [provider]  metFORMIN (GLUCOPHAGE) 500 MG tablet TAKE ONE TABLET BY MOUTH EVERY DAY WITH BREAKFAST 07/17/16  Yes Lada, Satira Anis, MD  nortriptyline (PAMELOR) 25 MG capsule Take 1 capsule (25 mg total) by mouth at bedtime. 08/03/16  Yes Lada, Satira Anis, MD  nystatin cream (MYCOSTATIN) Apply topically as needed for dry skin. 10/17/14  Yes Ashok Norris, MD  omeprazole (PRILOSEC) 20 MG capsule Take 1 capsule (20 mg total) by mouth daily. 10/03/15  Yes Roselee Nova, MD  Probiotic Product (PROBIOTIC-10) CAPS Take 1 capsule by mouth daily. 08/03/16  Yes Lada, Satira Anis, MD  ramipril (ALTACE) 10 MG capsule TAKE 1 CAPSULE BY MOUTH EVERY DAY 08/03/16  Yes Lada, Satira Anis, MD  simvastatin (ZOCOR) 10 MG tablet TAKE ONE TABLET BY MOUTH AT BEDTIME. 07/17/16  Yes Lada, Satira Anis, MD  STOOL SOFTENER 100 MG capsule TAKE 1 CAPSULE BY MOUTH TWICE A DAY. (STOOL SOFTNER) (TAKE WITH GLASSWATER) 03/31/16  Yes Roselee Nova, MD  Tea Tree 100 % OIL Apply 1 application topically daily. To nails 08/30/15  Yes Stover, Titorya, DPM  tolterodine (DETROL LA) 4 MG 24 hr capsule TAKE 1 CAPSULE BY MOUTH ONCE DAILY FOR BLADDER. **DO NOT CRUSH** 08/03/16  Yes Lada, Satira Anis, MD  ACCU-CHEK AVIVA PLUS test strip CHECK BLOOD SUGAR TWICE A WEEK 09/16/15   Ancil Boozer, Drue Stager, MD  Alcohol Swabs (B-D SINGLE USE SWABS REGULAR) PADS USE AS DIRECTED (CHECK BLOOD SUGAR TWICE A WEEK) 09/16/15   Sowles, Drue Stager, MD  polyethylene glycol powder (GLYCOLAX/MIRALAX) powder Take 255 g by mouth once. Patient not taking: Reported on 08/15/2016 10/17/14   Ashok Norris, MD   Physical Exam: Vitals:   08/15/16 1745 08/15/16 1800 08/15/16 1815 08/15/16 1900  BP: (!) 150/75 (!) 145/93 (!) 150/88 (!) 138/101  Pulse: (!) 111 (!) 110 (!) 104 (!) 117  Resp: 13 13 16 18   Temp:      TempSrc:      SpO2: 98% 99% 98% 99%  Weight:      Height:         General:  [place]/AT, WDWN, acutely ill appearing  Eyes: PERRL, EOMI, no scleral  icterus, conjunctiva clear  ENT: moist oropharynx without exudate, TM's benign, dentition fair  Neck: supple, no lymphadenopathy. No bruits or thyromegaly  Cardiovascular: rapid rate with regular rhythm without MRG; 2+ peripheral pulses, no JVD, trace peripheral edema  Respiratory: scattered rhonchi without wheezes or rales. No dullness. Respiratory effort increased  Abdomen: soft, non tender to palpation, positive bowel sounds, no guarding, no rebound  Skin: no rashes or lesions  Musculoskeletal: normal bulk and tone, no joint swelling  Psychiatric: normal mood and affect, A&OX2(not to time)  Neurologic: CN 2-12 grossly intact, Motor strength 5/5 in all 4 groups with symmetric DTR's and non-focal sensory exam  Labs on Admission:  Basic Metabolic Panel:  Recent Labs Lab 08/15/16 1517 08/15/16 1700  NA 134*  --   K 4.9  --   CL 99*  --   CO2 24  --  GLUCOSE 308*  --   BUN 51*  --   CREATININE 1.10*  --   CALCIUM 10.3  --   MG  --  1.7  PHOS  --  3.5   Liver Function Tests:  Recent Labs Lab 08/15/16 1700  AST 23  ALT 20  ALKPHOS 96  BILITOT 0.6  PROT 7.7  ALBUMIN 4.0    Recent Labs Lab 08/15/16 1700  LIPASE 19   No results for input(s): AMMONIA in the last 168 hours. CBC:  Recent Labs Lab 08/15/16 1517 08/15/16 1700  WBC 27.1* 26.8*  NEUTROABS  --  24.2*  HGB 14.2 14.1  HCT 42.6 43.4  MCV 89.1 89.0  PLT 361 366   Cardiac Enzymes:  Recent Labs Lab 08/15/16 1650  TROPONINI <0.03    BNP (last 3 results) No results for input(s): BNP in the last 8760 hours.  ProBNP (last 3 results) No results for input(s): PROBNP in the last 8760 hours.  CBG:  Recent Labs Lab 08/15/16 1513  GLUCAP 252*    Radiological Exams on Admission: Dg Chest Port 1 View  Result Date: 08/15/2016 CLINICAL DATA:  Hyperglycemia, history of traumatic brain injury, hypertension, diabetes mellitus EXAM: PORTABLE CHEST 1 VIEW COMPARISON:  Portable exam 1659 hours  compared to 04/20/2015 FINDINGS: Rotated to the LEFT. Normal heart size, mediastinal contours, and pulmonary vascularity. Lungs clear. No pleural effusion or pneumothorax. BILATERAL glenohumeral degenerative changes and diffuse osseous demineralization. IMPRESSION: No acute abnormalities. Electronically Signed   By: Lavonia Dana M.D.   On: 08/15/2016 17:32    EKG: Independently reviewed.  Assessment/Plan Principal Problem:   Sepsis ([place]) Active Problems:   BP (high blood pressure)   Type 2 diabetes mellitus ([place])   Traumatic brain injury ([place])   Will admit to floor with IV fluids and IV ABX. Cultures sent. Consult Neurology for consideration of meningitis. Repeat labs and CXR in AM.  Diet: soft Fluids: NS@100  DVT Prophylaxis: SQ Heparin  Code Status: FULL  Family Communication: yes  Disposition Plan: home  Time spent: 55 min

## 2016-08-15 NOTE — ED Triage Notes (Signed)
Pt here with caregiver from [place] with reports of pts blood sugar being more elevated than usual. Pt denies sx's. Per caregiver CBG has been around 250. Pt last ate around 1100 and does oral medications.

## 2016-08-16 ENCOUNTER — Inpatient Hospital Stay: Payer: Medicare Other

## 2016-08-16 LAB — COMPREHENSIVE METABOLIC PANEL
ALBUMIN: 3 g/dL — AB (ref 3.5–5.0)
ALT: 17 U/L (ref 14–54)
ANION GAP: 6 (ref 5–15)
AST: 20 U/L (ref 15–41)
Alkaline Phosphatase: 64 U/L (ref 38–126)
BUN: 27 mg/dL — AB (ref 6–20)
CHLORIDE: 109 mmol/L (ref 101–111)
CO2: 24 mmol/L (ref 22–32)
Calcium: 8.2 mg/dL — ABNORMAL LOW (ref 8.9–10.3)
Creatinine, Ser: 0.84 mg/dL (ref 0.44–1.00)
GFR calc Af Amer: >60 mL/min (ref >60)
GLUCOSE: 124 mg/dL — AB (ref 65–99)
POTASSIUM: 4.1 mmol/L (ref 3.5–5.1)
Sodium: 139 mmol/L (ref 135–145)
Total Bilirubin: 0.6 mg/dL (ref 0.3–1.2)
Total Protein: 5.5 g/dL — ABNORMAL LOW (ref 6.5–8.1)

## 2016-08-16 LAB — MRSA PCR SCREENING: MRSA by PCR: NEGATIVE

## 2016-08-16 LAB — GLUCOSE, CAPILLARY
GLUCOSE-CAPILLARY: 147 mg/dL — AB (ref 65–99)
Glucose-Capillary: 130 mg/dL — ABNORMAL HIGH (ref 65–99)
Glucose-Capillary: 123 mg/dL — ABNORMAL HIGH (ref 65–99)

## 2016-08-16 LAB — CBC
HEMATOCRIT: 31.1 % — AB (ref 35.0–47.0)
HEMOGLOBIN: 10.5 g/dL — AB (ref 12.0–16.0)
MCH: 29.9 pg (ref 26.0–34.0)
MCHC: 33.7 g/dL (ref 32.0–36.0)
MCV: 88.9 fL (ref 80.0–100.0)
Platelets: 254 10*3/uL (ref 150–440)
RBC: 3.5 MIL/uL — ABNORMAL LOW (ref 3.80–5.20)
RDW: 13 % (ref 11.5–14.5)
WBC: 12.3 10*3/uL — AB (ref 3.6–11.0)

## 2016-08-16 MED ORDER — DEXTROSE 5 % IV SOLN
2.0000 g | Freq: Two times a day (BID) | INTRAVENOUS | Status: DC
  Administered 2016-08-16 – 2016-08-18 (×6): 2 g via INTRAVENOUS
  Filled 2016-08-16 (×7): qty 2

## 2016-08-16 MED ORDER — IPRATROPIUM-ALBUTEROL 0.5-2.5 (3) MG/3ML IN SOLN
3.0000 mL | Freq: Three times a day (TID) | RESPIRATORY_TRACT | Status: DC
  Administered 2016-08-16 – 2016-08-19 (×9): 3 mL via RESPIRATORY_TRACT
  Filled 2016-08-16 (×9): qty 3

## 2016-08-16 MED ORDER — IPRATROPIUM-ALBUTEROL 0.5-2.5 (3) MG/3ML IN SOLN
RESPIRATORY_TRACT | Status: AC
  Filled 2016-08-16: qty 3

## 2016-08-16 MED ORDER — RAMIPRIL 2.5 MG PO CAPS
2.5000 mg | ORAL_CAPSULE | Freq: Two times a day (BID) | ORAL | Status: DC
  Administered 2016-08-16 – 2016-08-20 (×9): 2.5 mg via ORAL
  Filled 2016-08-16 (×11): qty 1

## 2016-08-16 MED ORDER — VANCOMYCIN HCL IN DEXTROSE 1-5 GM/200ML-% IV SOLN
1000.0000 mg | Freq: Two times a day (BID) | INTRAVENOUS | Status: DC
  Administered 2016-08-16 – 2016-08-19 (×6): 1000 mg via INTRAVENOUS
  Filled 2016-08-16 (×7): qty 200

## 2016-08-16 NOTE — Clinical Social Work Note (Signed)
CSW received consult that patient is from [facility]. CSW will assess when able.   Santiago Bumpers, MSW, Latanya Presser [phone]

## 2016-08-16 NOTE — NC FL2 (Signed)
[place] LEVEL OF CARE SCREENING TOOL     IDENTIFICATION  Patient Name: [name] [name] Birthdate: 1960-05-24 Sex: female Admission Date (Current Location): 08/15/2016  [facility] and [place] Number:  Engineering geologist and Address:  [facility], [address], [place], [place] [address]      Provider Number: [id number]  Attending Physician Name and Address:  Epifanio Lesches, MD  Relative Name and Phone Number:       Current Level of Care: Hospital Recommended Level of Care: Person Prior Approval Number:    Date Approved/Denied:   PASRR Number:    Discharge Plan: SNF    Current Diagnoses: Patient Active Problem List   Diagnosis Date Noted  . Sepsis ([place]) 08/15/2016  . UTI (urinary tract infection) 03/06/2016  . Dysphagia 12/11/2015  . Type 2 diabetes mellitus ([place]) 12/03/2014  . Traumatic brain injury ([place]) 12/03/2014  . Dehydration 08/23/2014  . Chronic constipation 08/23/2014  . Clinical depression 08/23/2014  . Diabetes mellitus, type 2 ([place]) 08/23/2014  . BP (high blood pressure) 08/23/2014  . Injury brain, traumatic ([place]) 08/23/2014  . OP (osteoporosis) 08/23/2014  . HLD (hyperlipidemia) 08/23/2014  . Chest pain syndrome 08/23/2014  . Cognitive impairment 08/23/2014    Orientation RESPIRATION BLADDER Height & Weight     Self, Place, Situation  Normal Continent Weight: 152 lb 11.2 oz (69.3 kg) Height:  5\' 6"  (167.6 cm)  BEHAVIORAL SYMPTOMS/MOOD NEUROLOGICAL BOWEL NUTRITION STATUS     X2 Continent    AMBULATORY STATUS COMMUNICATION OF NEEDS Skin    Extensive Assistance  Verbally Normal                       Personal Care Assistance Level of Assistance     Bathing: Limited Assistance Feeding: Limited Assistance  Dressing: Limited Assistance       Functional Limitations Info             SPECIAL CARE FACTORS FREQUENCY  PT (By licensed PT)     PT Frequency: Up to 5X per day,  5 days a week              Contractures      Additional Factors Info  Allergies   Allergies Info: Dilantin Phenytoin, Penicillins, Phenytoin Sodium Extended, Phenytoin Sodium Extended, Sulfa Antibiotics           Current Medications (08/16/2016):  This is the current hospital active medication list Current Facility-Administered Medications  Medication Dose Route Frequency Provider Last Rate Last Dose  . 0.9 %  sodium chloride infusion   Intravenous Continuous Idelle Crouch, MD 100 mL/hr at 08/15/16 2323    . acetaminophen (TYLENOL) tablet 650 mg  650 mg Oral Q6H PRN Idelle Crouch, MD       Or  . acetaminophen (TYLENOL) suppository 650 mg  650 mg Rectal Q6H PRN Idelle Crouch, MD      . bisacodyl (DULCOLAX) suppository 10 mg  10 mg Rectal Daily PRN Idelle Crouch, MD      . calcium-vitamin D (OSCAL WITH D) 500-200 MG-UNIT per tablet 1 tablet  1 tablet Oral BID Idelle Crouch, MD   1 tablet at 08/16/16 [phone]  . cefTRIAXone (ROCEPHIN) 2 g in dextrose 5 % 50 mL IVPB  2 g Intravenous Q12H Epifanio Lesches, MD   Stopped at 08/16/16 1415  . cilostazol (PLETAL) tablet 50 mg  50 mg Oral BID Idelle Crouch, MD  50 mg at 08/16/16 0831  . desmopressin (DDAVP) tablet 200 mcg  200 mcg Oral QHS Idelle Crouch, MD   200 mcg at 08/15/16 2331  . docusate sodium (COLACE) capsule 100 mg  100 mg Oral BID Idelle Crouch, MD   100 mg at 08/16/16 0830  . donepezil (ARICEPT) tablet 10 mg  10 mg Oral BID Epifanio Lesches, MD   10 mg at 08/16/16 0830  . famotidine (PEPCID) IVPB 20 mg premix  20 mg Intravenous Q12H Idelle Crouch, MD   Stopped at 08/16/16 [phone]  . ferrous sulfate tablet 325 mg  325 mg Oral Daily Idelle Crouch, MD   325 mg at 08/16/16 9937  . FLUoxetine (PROZAC) capsule 30 mg  30 mg Oral Tamela Oddi, Lise Auer, MD   30 mg at 08/16/16 0831  . heparin injection 5,000 Units  5,000 Units Subcutaneous Q8H Idelle Crouch, MD   5,000 Units at 08/16/16  1345  . hydrALAZINE (APRESOLINE) injection 10 mg  10 mg Intravenous Q4H PRN Idelle Crouch, MD      . insulin aspart (novoLOG) injection 0-9 Units  0-9 Units Subcutaneous TID WC Idelle Crouch, MD   1 Units at 08/16/16 1206  . ipratropium-albuterol (DUONEB) 0.5-2.5 (3) MG/3ML nebulizer solution 3 mL  3 mL Nebulization TID Epifanio Lesches, MD   3 mL at 08/16/16 1411  . ipratropium-albuterol (DUONEB) 0.5-2.5 (3) MG/3ML nebulizer solution           . levofloxacin (LEVAQUIN) IVPB 750 mg  750 mg Intravenous Q24H Ramond Dial, RPH      . linagliptin (TRADJENTA) tablet 5 mg  5 mg Oral Daily Idelle Crouch, MD   5 mg at 08/16/16 1696  . LORazepam (ATIVAN) injection 0.5 mg  0.5 mg Intravenous Q4H PRN Idelle Crouch, MD      . lurasidone (LATUDA) tablet 20 mg  20 mg Oral Gloris Ham, MD   20 mg at 08/16/16 7893  . memantine (NAMENDA XR) 24 hr capsule 28 mg  28 mg Oral Daily Idelle Crouch, MD   28 mg at 08/16/16 8101  . metFORMIN (GLUCOPHAGE) tablet 500 mg  500 mg Oral Q breakfast Idelle Crouch, MD   500 mg at 08/16/16 0831  . morphine 2 MG/ML injection 2 mg  2 mg Intravenous Q2H PRN Idelle Crouch, MD      . nortriptyline (PAMELOR) capsule 25 mg  25 mg Oral QHS Idelle Crouch, MD   25 mg at 08/15/16 2321  . ondansetron (ZOFRAN) tablet 4 mg  4 mg Oral Q6H PRN Idelle Crouch, MD       Or  . ondansetron (ZOFRAN) injection 4 mg  4 mg Intravenous Q6H PRN Idelle Crouch, MD      . pantoprazole (PROTONIX) EC tablet 40 mg  40 mg Oral Daily Idelle Crouch, MD   40 mg at 08/16/16 0830  . ramipril (ALTACE) capsule 2.5 mg  2.5 mg Oral BID Epifanio Lesches, MD      . simvastatin (ZOCOR) tablet 10 mg  10 mg Oral QHS Idelle Crouch, MD   10 mg at 08/15/16 2321  . vancomycin (VANCOCIN) IVPB 1000 mg/200 mL premix  1,000 mg Intravenous Q12H Epifanio Lesches, MD         Discharge Medications: Please see discharge summary for a list of discharge  medications.  Relevant Imaging Results:  Relevant Lab Results:   Additional Information SS# [id number]  Zettie Pho, LCSW

## 2016-08-16 NOTE — Progress Notes (Signed)
Family states pt only has eaten pureed with nectar thick liquids in years. MD notified and order changed.  Bethann Punches, RN

## 2016-08-16 NOTE — Clinical Social Work Note (Signed)
Clinical Social Work Assessment  Patient Details  Name: [name] [name] MRN: [id number] Date of Birth: 1960-11-19  Date of referral:  08/16/16               Reason for consult:  Facility Placement                Permission sought to share information with:  Facility Art therapist granted to share information::  Yes, Verbal Permission Granted  Name::        Agency::     Relationship::     Contact Information:     Housing/Transportation Living arrangements for the past 2 months:  Group Home Source of Information:  Patient, Guardian Patient Interpreter Needed:  None Criminal Activity/Legal Involvement Pertinent to Current Situation/Hospitalization:  No - Comment as needed Significant Relationships:  Siblings Lives with:  Facility Resident Do you feel safe going back to the place where you live?  Yes Need for family participation in patient care:  Yes (Comment) (Patient has some confusion at baseline and defers to her HCPOA for decisions.)  Care giving concerns:  Patient admitted from group home/PT recommendation for STR   Social Worker assessment / plan:  CSW met with the patient at bedside to discuss discharge planning. The patient confirmed that she is from [facility] and that she would like to return. She also requested that the patient contact her sister. The CSW contacted [name] [name] who is listed at the patient's legal guardian. [name] [name] gave verbal permission to conduct CSW referral. CSW explained insurance payment, and [place] verbalized understanding. She would prefer that the patient return to [facility] with home health if possible, but she is willing to consider SNF placement for STR.   CSW began PASRR process which will require manual review by  [facility] due to history of TBI with dementia. The patient cannot discharge to SNF without this review, but she can discharge to [facility] with home health if they are amenable to such. CSW will continue to  follow.  Employment status:  Retired Forensic scientist:  Medicaid In [place], [place] PT Recommendations:  [place] / Referral to community resources:  [place]  Patient/Family's Response to care:  The patient and her sister thanked the CSW for assistance.  Patient/Family's Understanding of and Emotional Response to Diagnosis, Current Treatment, and Prognosis:  The patient has limited understanding of her needs. The patient's sister understands the PT recommendation and is willing to consider it, but she would prefer that the patient return to the group home with home health.  Emotional Assessment Appearance:  Appears stated age Attitude/Demeanor/Rapport:  Lethargic Affect (typically observed):  Flat Orientation:  Oriented to Self, Oriented to Place, Oriented to Situation Alcohol / Substance use:  Never Used Psych involvement (Current and /or in the community):  No (Comment)  Discharge Needs  Concerns to be addressed:  Care Coordination, Discharge Planning Concerns Readmission within the last 30 days:  No Current discharge risk:  Chronically ill Barriers to Discharge:  Continued Medical Work up   [employer], LCSW 08/16/2016, 2:48 PM

## 2016-08-16 NOTE — Progress Notes (Signed)
MD notified of low BP. Verbal order received to hold morning dose of Ramipril.   Bethann Punches, RN

## 2016-08-16 NOTE — Progress Notes (Signed)
[place] [facility] [place] NAME: [name] [name]    MR#:  [id number]  DATE OF BIRTH:  [birth year]  SUBJECTIVE:seen at bedside.admitted for sepsis, possible meningitis, LP was not done in the emergency room. Continue to hold on antibiotics with Azactam, vancomycin, Levaquin. Patient is afebrile, alert, slow to respond because of her TBI history. Denies headache or neck pain.   CHIEF COMPLAINT:   Chief Complaint  Patient presents with  . Hyperglycemia    REVIEW OF SYSTEMS:   Review of Systems  Unable to perform ROS: Medical condition     DRUG ALLERGIES:   Allergies  Allergen Reactions  . Dilantin [Phenytoin] Other (See Comments)    unknown  . Penicillins Other (See Comments)    .Has patient had a PCN reaction causing immediate rash, facial/tongue/throat swelling, SOB or lightheadedness with hypotension: Unknown Has patient had a PCN reaction causing severe rash involving mucus membranes or skin necrosis: Unknown Has patient had a PCN reaction that required hospitalization: Unknown Has patient had a PCN reaction occurring within the last 10 years: Unknown If all of the above answers are "NO", then may proceed with Cephalosporin use.   Marland Kitchen Phenytoin Sodium Extended Other (See Comments)    Other reaction(s): Other (See Comments)  . Phenytoin Sodium Extended Other (See Comments)    Other reaction(s): Other (See Comments)  . Sulfa Antibiotics Rash and Other (See Comments)    unknown    VITALS:  Blood pressure 94/61, pulse (!) 118, temperature 98.4 F (36.9 C), temperature source Oral, resp. rate 18, height 5\' 6"  (1.676 m), weight 69.3 kg (152 lb 11.2 oz), SpO2 91 %.  PHYSICAL EXAMINATION:  GENERAL:  56 y.o.-year-old patient lying in the bed with no acute distress.  EYES: Pupils equal, round, reactive to light . No scleral icterus. Extraocular muscles intact.  HEENT: Head atraumatic, normocephalic. Oropharynx and nasopharynx clear.   NECK:  Supple, no jugular venous distention. No thyroid enlargement, no tenderness.  LUNGS: Normal breath sounds bilaterally, no wheezing, rales,rhonchi or crepitation. No use of accessory muscles of respiration.  CARDIOVASCULAR: S1, S2 normal. No murmurs, rubs, or gallops.  ABDOMEN: Soft, nontender, nondistended. Bowel sounds present. No organomegaly or mass.  EXTREMITIES: No pedal edema, cyanosis, or clubbing.  NEUROLOGIC: Cranial nerves II through XII are intact. Muscle strength 5/5 in all extremities. Sensation intact. Gait not checked. No neck stiffness. PSYCHIATRIC: The patient is alert ,awake. SKIN: No obvious rash, lesion, or ulcer.    LABORATORY PANEL:   CBC  Recent Labs Lab 08/16/16 0359  WBC 12.3*  HGB 10.5*  HCT 31.1*  PLT 254   ------------------------------------------------------------------------------------------------------------------  Chemistries   Recent Labs Lab 08/15/16 1700 08/16/16 0359  NA  --  139  K  --  4.1  CL  --  109  CO2  --  24  GLUCOSE  --  124*  BUN  --  27*  CREATININE  --  0.84  CALCIUM  --  8.2*  MG 1.7  --   AST 23 20  ALT 20 17  ALKPHOS 96 64  BILITOT 0.6 0.6   ------------------------------------------------------------------------------------------------------------------  Cardiac Enzymes  Recent Labs Lab 08/15/16 1650  TROPONINI <0.03   ------------------------------------------------------------------------------------------------------------------  RADIOLOGY:  Portable Chest 1 View  Result Date: 08/16/2016 CLINICAL DATA:  56 year old female former smoker with a history of hypertension and diabetes. Currently admitted with hyperglycemia, sepsis and meningitis. EXAM: PORTABLE CHEST 1 VIEW COMPARISON:  Prior chest x-ray 08/15/2016 FINDINGS: The lungs  are clear and negative for focal airspace consolidation, pulmonary edema or suspicious pulmonary nodule. No pleural effusion or pneumothorax. Cardiac and mediastinal  contours are within normal limits. No acute fracture or lytic or blastic osseous lesions. Advanced degenerative changes of both glenohumeral joints. The visualized upper abdominal bowel gas pattern is unremarkable. IMPRESSION: Negative chest x-ray. Electronically Signed   By: Jacqulynn Cadet M.D.   On: 08/16/2016 08:26   Dg Chest Port 1 View  Result Date: 08/15/2016 CLINICAL DATA:  Hyperglycemia, history of traumatic brain injury, hypertension, diabetes mellitus EXAM: PORTABLE CHEST 1 VIEW COMPARISON:  Portable exam 1659 hours compared to 04/20/2015 FINDINGS: Rotated to the LEFT. Normal heart size, mediastinal contours, and pulmonary vascularity. Lungs clear. No pleural effusion or pneumothorax. BILATERAL glenohumeral degenerative changes and diffuse osseous demineralization. IMPRESSION: No acute abnormalities. Electronically Signed   By: Lavonia Dana M.D.   On: 08/15/2016 17:32    EKG:   Orders placed or performed during the hospital encounter of 08/15/16  . ED EKG  . ED EKG  . EKG 12-Lead  . EKG 12-Lead    ASSESSMENT AND PLAN:   Sepsis likely due to meningitis: LP was unsuccessful in the emergency room. Patient received Vanco, Azactam, Levaquin. One dose of acyclovir given in the emergency room. neuro consult requested. WBC 26 yesterday, today 12.3. Elevated lactic acid, elevated pro calcitonin on admission. #2 history of traumatic brain injury. #3 essential hypertension: Hypotensive today hold BP medicines. Continue aggressive hydration. #4 diabetes mellitus type 2: Continue Januvia, metformin. #5 TBI with dementia: Continue fluoxetine, Pamelor, Namenda. CODE STATUS full code  All the records are reviewed and case discussed with Care Management/Social Workerr. Management plans discussed with the patient, family and they are in agreement.  CODE STATUS: full  TOTAL TIME TAKING CARE OF THIS PATIENT 35 minutes.   POSSIBLE D/C IN 1-2 DAYS, DEPENDING ON CLINICAL  CONDITION.   Epifanio Lesches M.D on 08/16/2016 at 8:58 AM  Between 7am to 6pm - Pager - [phone]  After 6pm go to [email or web address] - password [email or web address] [facility]  [facility]  Office  [phone]  CC: Primary care physician; Roselee Nova, MD   Note: This dictation was prepared with Dragon dictation along with smaller phrase technology. Any transcriptional errors that result from this process are unintentional.

## 2016-08-16 NOTE — Consult Note (Signed)
Pharmacy Antibiotic Note  DORAINE SCHEXNIDER is a 56 y.o. female admitted on 08/15/2016 with sepsis and meningitis.  Pharmacy has been consulted for vancomycin, aztreonam, and levofloxacin dosing. Not able to obtain LP in ED  Pt previously tolerated ceftriaxone, per  MD will change aztreonam to ceftriaxone  Plan: Renal function improved, will increase to vancomycin 1gm IV Q12H to target trough of 15-13mcg/ml. Trough prior to 5th dose.  Ceftriaxone 2gm IV Q12H  Levofloxacin 750mg  IV Q24H   Height: 5\' 6"  (167.6 cm) Weight: 152 lb 11.2 oz (69.3 kg) IBW/kg (Calculated) : 59.3  Temp (24hrs), Avg:98.2 F (36.8 C), Min:97.8 F (36.6 C), Max:98.6 F (37 C)   Recent Labs Lab 08/15/16 1517 08/15/16 1635 08/15/16 1700 08/15/16 1912 08/16/16 0359  WBC 27.1*  --  26.8*  --  12.3*  CREATININE 1.10*  --   --   --  0.84  LATICACIDVEN  --  2.7*  --  2.5*  --     Estimated Creatinine Clearance: 69.2 mL/min (by C-G formula based on SCr of 0.84 mg/dL).    Allergies  Allergen Reactions  . Dilantin [Phenytoin] Other (See Comments)    unknown  . Penicillins Other (See Comments)    .Has patient had a PCN reaction causing immediate rash, facial/tongue/throat swelling, SOB or lightheadedness with hypotension: Unknown Has patient had a PCN reaction causing severe rash involving mucus membranes or skin necrosis: Unknown Has patient had a PCN reaction that required hospitalization: Unknown Has patient had a PCN reaction occurring within the last 10 years: Unknown If all of the above answers are "NO", then may proceed with Cephalosporin use.   Marland Kitchen Phenytoin Sodium Extended Other (See Comments)    Other reaction(s): Other (See Comments)  . Phenytoin Sodium Extended Other (See Comments)    Other reaction(s): Other (See Comments)  . Sulfa Antibiotics Rash and Other (See Comments)    unknown    Antimicrobials this admission: Ceftriaxone 7/22 >> levofloxacin 7/21 >>  aztreonam 7/21 >>  7/22 Vancomycin 7/21>> Acyclovir one dose  Dose adjustments this admission:   Microbiology results: 7/21 BCx: NGTD 7/21 UCx:   7/22 MRSA PCR negative   Thank you for allowing pharmacy to be a part of this patient's care.  Tyrisha Benninger C 08/16/2016 11:22 AM

## 2016-08-16 NOTE — Evaluation (Signed)
Physical Therapy Evaluation Patient Details Name: [name] [name] MRN: [id number] DOB: 02-11-1960 Today's Date: 08/16/2016   History of Present Illness  Pt is a 56 y/o F who presented with weakness and anorexia.  Chest x-ray and UA negative.  Procalcitonin elevated consistent with sepsis.  Pt admitted for sepsis, possible meningitis.  Pt's PMH includes TBI, speech difficulties, osteoporosis, double vision, spastic quadriparesis.    Clinical Impression  Pt admitted with above diagnosis. Pt currently with functional limitations due to the deficits listed below (see PT Problem List). [name] [name] presents with what appears to be more weakness than her baseline, which pt reports as well.  She provides conflicting information at times but for the most part is able to answer questions appropriately.  She currently requires mod assist for bed mobility and max assist for sit<>stand and stand pivot transfers.  Pt would benefit from OT consult for assessment of need for RUE splint and assist with ADLs. Given pt's current mobility status, recommending SNF at d/c. Pt will benefit from skilled PT to increase their independence and safety with mobility to allow discharge to the venue listed below.      Follow Up Recommendations SNF    Equipment Recommendations  Other (comment) (TBD at next venue of care)    Recommendations for Other Services OT consult     Precautions / Restrictions Precautions Precautions: Fall Restrictions Weight Bearing Restrictions: No      Mobility  Bed Mobility Overal bed mobility: Needs Assistance Bed Mobility: Supine to Sit     Supine to sit: Mod assist;HOB elevated     General bed mobility comments: Cues for sequencing and pt uses bed rail to pull up to sitting with assist.  Assist to advance LEs to EOB and use of bed pad to scoot pt to EOB.    Transfers Overall transfer level: Needs assistance Equipment used: None Transfers: Sit to/from [employer] Sit to Stand: Max assist Stand pivot transfers: Max assist       General transfer comment: Pt requires a light max assist to boost to standing with cues to push from bed.  Max assist to pivot to chair as pt minimally shuffles her feet.  Pt unable to report to this therapist how she performs her pivots independently at baseline.   Ambulation/Gait             General Gait Details: Pt non ambulatory.  No safe to attempt at this time.  Stairs            Wheelchair Mobility    Modified Rankin (Stroke Patients Only)       Balance Overall balance assessment: Needs assistance Sitting-balance support: Feet supported;Bilateral upper extremity supported Sitting balance-Leahy Scale: Poor Sitting balance - Comments: Pt relies on BUE support while demonstrating posterior lean while seated EOB. Postural control: Posterior lean Standing balance support: Bilateral upper extremity supported;During functional activity Standing balance-Leahy Scale: Poor Standing balance comment: Pt relies on UE support and max assist to maintain balance with static or dynamic standing                             Pertinent Vitals/Pain Pain Assessment: No/denies pain    Home Living Family/patient expects to be discharged to:: Group home Living Arrangements: Non-relatives/Friends;Group Home Available Help at Discharge: Available 24 hours/day (Pt reports there is always a staff member at the home) Type of Home: Group Home Home Access: Ramped entrance  Home Layout: One level Home Equipment: Wheelchair - manual Additional Comments: Information taken from pt, unsure of reliability    Prior Function Level of Independence: Needs assistance   Gait / Transfers Assistance Needed: Pt reports she is independent performing stand pivot transfer to her WC.  Denies any falls over the past 6 months.    ADL's / Homemaking Assistance Needed: Pt reports she takes a sponge bath in the  shower and does this without assist.  She reports she is independent with dressing.  Meals are provided by the home.         Hand Dominance        Extremity/Trunk Assessment   Upper Extremity Assessment Upper Extremity Assessment: RUE deficits/detail;LUE deficits/detail RUE Deficits / Details: R wrist and hand strength grossly 1/5, otherwise strength grossly 2/5.  Pt holds R wrist and digits in flexion but no contracture noted.  Pt reports she has had a splint in the past but when asked why she does not use it she replies, "I don't know" LUE Deficits / Details: Strength grossly 2+/5    Lower Extremity Assessment Lower Extremity Assessment:  (BLE strength grossly 3-/5, h/o spastic quadriparesis)    Cervical / Trunk Assessment Cervical / Trunk Assessment: Kyphotic  Communication   Communication: Other (comment) (Difficult speech due to h/o TBI)  Cognition Arousal/Alertness: Awake/alert Behavior During Therapy: Flat affect Overall Cognitive Status: History of cognitive impairments - at baseline                                 General Comments: Pt able to answer yes no and short open ended questions.  Pt's responses conflicted at times but for the most part is able to answer questions correctly.      General Comments General comments (skin integrity, edema, etc.): HR up to 128 with bed mobility and pivot.    Exercises Other Exercises Other Exercises: Pt sat EOB for ~4 minutes demonstrating a posterior lean.  Pt able to improve posture with cues to lean anteriorly but demonstrates poor endurance with this.   Assessment/Plan    PT Assessment Patient needs continued PT services  PT Problem List Decreased strength;Decreased range of motion;Decreased activity tolerance;Decreased balance;Decreased mobility;Decreased cognition;Decreased knowledge of use of DME;Decreased safety awareness       PT Treatment Interventions DME instruction;Gait training;Functional  mobility training;Therapeutic activities;Therapeutic exercise;Balance training;Neuromuscular re-education;Cognitive remediation;Patient/family education;Wheelchair mobility training    PT Goals (Current goals can be found in the Care Plan section)  Acute Rehab PT Goals Patient Stated Goal: pt understands recommendation for SNF PT Goal Formulation: With patient Time For Goal Achievement: 08/30/16 Potential to Achieve Goals: Good    Frequency Min 2X/week   Barriers to discharge Decreased caregiver support Unsure of amount of assist available from staff at group home    Co-evaluation               AM-PAC PT "6 Clicks" Daily Activity  Outcome Measure Difficulty turning over in bed (including adjusting bedclothes, sheets and blankets)?: Total Difficulty moving from lying on back to sitting on the side of the bed? : Total Difficulty sitting down on and standing up from a chair with arms (e.g., wheelchair, bedside commode, etc,.)?: Total Help needed moving to and from a bed to chair (including a wheelchair)?: A Lot Help needed walking in hospital room?: Total Help needed climbing 3-5 steps with a railing? : Total 6 Click Score: 7  End of Session Equipment Utilized During Treatment: Gait belt Activity Tolerance: Patient tolerated treatment well;Patient limited by fatigue Patient left: in chair;with call bell/phone within reach;with chair alarm set Nurse Communication: Mobility status PT Visit Diagnosis: Muscle weakness (generalized) (M62.81);Unsteadiness on feet (R26.81)    Time: 8882-8003 PT Time Calculation (min) (ACUTE ONLY): 23 min   Charges:   PT Evaluation $PT Eval Low Complexity: 1 Procedure PT Treatments $Therapeutic Activity: 8-22 mins   PT G Codes:        Collie Siad PT, DPT 08/16/2016, 10:37 AM

## 2016-08-16 NOTE — Clinical Social Work Note (Signed)
CSW has attempted to contact the patient's legal guardian to discuss discharge planning and PT recommendation for SNF. CSW left HIPPA compliant voice message and is waiting for return call. Assessment to follow.  Santiago Bumpers, MSW, Latanya Presser [phone]

## 2016-08-17 LAB — BLOOD CULTURE ID PANEL (REFLEXED)
Acinetobacter baumannii: NOT DETECTED
CANDIDA ALBICANS: NOT DETECTED
CANDIDA KRUSEI: NOT DETECTED
CANDIDA TROPICALIS: NOT DETECTED
Candida glabrata: NOT DETECTED
Candida parapsilosis: NOT DETECTED
ENTEROBACTER CLOACAE COMPLEX: NOT DETECTED
ENTEROBACTERIACEAE SPECIES: NOT DETECTED
ESCHERICHIA COLI: NOT DETECTED
Enterococcus species: NOT DETECTED
Haemophilus influenzae: NOT DETECTED
KLEBSIELLA OXYTOCA: NOT DETECTED
KLEBSIELLA PNEUMONIAE: NOT DETECTED
Listeria monocytogenes: NOT DETECTED
Methicillin resistance: NOT DETECTED
Neisseria meningitidis: NOT DETECTED
PROTEUS SPECIES: NOT DETECTED
Pseudomonas aeruginosa: NOT DETECTED
STAPHYLOCOCCUS SPECIES: DETECTED — AB
STREPTOCOCCUS PYOGENES: NOT DETECTED
STREPTOCOCCUS SPECIES: NOT DETECTED
Serratia marcescens: NOT DETECTED
Staphylococcus aureus (BCID): NOT DETECTED
Streptococcus agalactiae: NOT DETECTED
Streptococcus pneumoniae: NOT DETECTED

## 2016-08-17 LAB — CREATININE, SERUM
CREATININE: 0.72 mg/dL (ref 0.44–1.00)
GFR calc Af Amer: >60 mL/min (ref >60)

## 2016-08-17 LAB — VANCOMYCIN, TROUGH: VANCOMYCIN TR: 15 ug/mL (ref 15–20)

## 2016-08-17 LAB — CBC
HCT: 27.3 % — ABNORMAL LOW (ref 35.0–47.0)
HEMOGLOBIN: 9.2 g/dL — AB (ref 12.0–16.0)
MCH: 30 pg (ref 26.0–34.0)
MCHC: 33.7 g/dL (ref 32.0–36.0)
MCV: 89.1 fL (ref 80.0–100.0)
PLATELETS: 210 10*3/uL (ref 150–440)
RBC: 3.06 MIL/uL — AB (ref 3.80–5.20)
RDW: 13.4 % (ref 11.5–14.5)
WBC: 8.2 10*3/uL (ref 3.6–11.0)

## 2016-08-17 LAB — HIV ANTIBODY (ROUTINE TESTING W REFLEX): HIV Screen 4th Generation wRfx: NONREACTIVE

## 2016-08-17 LAB — GLUCOSE, CAPILLARY
GLUCOSE-CAPILLARY: 186 mg/dL — AB (ref 65–99)
GLUCOSE-CAPILLARY: 131 mg/dL — AB (ref 65–99)
Glucose-Capillary: 99 mg/dL (ref 65–99)
Glucose-Capillary: 128 mg/dL — ABNORMAL HIGH (ref 65–99)

## 2016-08-17 MED ORDER — ACETAMINOPHEN 650 MG RE SUPP: 650.0000 mg | Freq: Four times a day (QID) | RECTAL | Status: DC | PRN

## 2016-08-17 MED ORDER — ACETAMINOPHEN 325 MG PO TABS: 650.0000 mg | ORAL_TABLET | Freq: Four times a day (QID) | ORAL | Status: DC | PRN

## 2016-08-17 MED ORDER — LEVOFLOXACIN 750 MG PO TABS: 750.0000 mg | ORAL_TABLET | Freq: Every day | ORAL | 0 refills | Status: DC

## 2016-08-17 NOTE — Discharge Summary (Addendum)
[name] [name], is a 56 y.o. female  DOB 06-Oct-1960  MRN [id number].  Admission date:  08/15/2016  Admitting Physician  Idelle Crouch, MD  Discharge Date:  08/21/2016   Primary MD  Roselee Nova, MD  Recommendations for primary care physician for things to follow:  Follow-up with PCP in 1 week.    Admission Diagnosis  Hyperglycemia [R73.9] Sepsis, due to unspecified organism [facility]) [A41.9]   Discharge Diagnosis  Hyperglycemia [R73.9] Sepsis, due to unspecified organism [facility]) [A41.9]   Principal Problem:   Sepsis ([place]) Active Problems:   BP (high blood pressure)   Type 2 diabetes mellitus ([place])   Traumatic brain injury ([place]) Enterococcus UTI     Past Medical History:  Diagnosis Date  . Arthritis   . Borderline intellectual functioning   . Diabetes mellitus without complication ([place])   . Double vision   . Hypertension   . Osteoporosis   . 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[place])   . 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 Traumatic brain injury [facility])     History reviewed. No pertinent surgical history.  History of present illness and  Hospital Course:     Kindly see H&P for history of present illness and admission details, please review complete Labs, Consult reports and Test reports for all details in brief  HPI  from the history and physical done on the day of admission  56 year old female patient with history of traumatic brain injury, speech difficulties, osteoporosis, spastic quadriparesis comes in with weakness, anorexia, cough. Patient admitted for sepsis.  Hospital Course   #1 Sepsis present on admission due to enterococcus UTI LP was attempted but unsuccessful. Meningitis seems unlikely as she did not have any neuro changes, neck rigidity. Also with source of sepsis being enterococcus UTI will treat with  Levaquin for 3 more days. PCN allergy.  Lactic acid has normalized.  #2. mental retardation with traumatic brain injury, speech difficulties: Patient also has spastic quadriparesis. Physical therapy recommended skilled nursing.  #3. Hypertension : Patient was hypotensive on day 1 but blood pressure improved . Changed ramipril to Toprol XL #4 hyperlipidemia: Continue statins  #5  overactive bladder: Patient is on Tolterodine.  #6.Diabetes mellitus type II: Patient is on Januvia, metformin: Continue them.  #7 dementia, depression: Patient is on Namenda, Aricept, Latuda, Prozac.  Discharge Condition: stable for discharge to SNF  Follow UP   with PCP in one week    Discharge Instructions  and  Discharge Medications     Allergies as of 08/21/2016      Reactions   Dilantin [phenytoin] Other (See Comments)   unknown   Penicillins Other (See Comments)   .Has patient had a PCN reaction causing immediate rash, facial/tongue/throat swelling, SOB or lightheadedness with hypotension: Unknown Has patient had a PCN reaction causing severe rash involving mucus membranes or skin necrosis: Unknown Has patient had a PCN reaction that required hospitalization: Unknown Has patient had a PCN reaction occurring within the last 10 years: Unknown If all of the above answers are "NO", then may proceed with Cephalosporin use.   Phenytoin Sodium Extended Other (See Comments)   Other reaction(s): Other (See Comments)   Phenytoin Sodium Extended Other (See Comments)   Other reaction(s): Other (See Comments)   Sulfa Antibiotics Rash, Other (See Comments)   unknown      Medication List    STOP taking these medications   ramipril 10 MG capsule Commonly known as:  ALTACE  TAKE these medications   ACCU-CHEK AVIVA PLUS test strip Generic drug:  glucose blood CHECK BLOOD SUGAR TWICE A WEEK   alendronate 70 MG tablet Commonly known as:  FOSAMAX 1 TAB PO WEEKLY EARLY AM BEFORE FOOD/MEDS  W/WATER. DON'T LIE DOWN FOR30 MIN (OSTEOPOROSIS) *NO CRUSH*   B-D SINGLE USE SWABS REGULAR Pads USE AS DIRECTED (CHECK BLOOD SUGAR TWICE A WEEK)   calcium-vitamin D 500-200 MG-UNIT tablet Commonly known as:  OSCAL WITH D Take 1 tablet by mouth 2 (two) times daily.   cholecalciferol 400 units Tabs tablet Commonly known as:  VITAMIN D TAKE ONE TABLET BY MOUTH 2 TIMES A DAY   cilostazol 50 MG tablet Commonly known as:  PLETAL Take 1 tablet (50 mg total) by mouth 2 (two) times daily.   desmopressin 0.2 MG tablet Commonly known as:  DDAVP TAKE 1 TABLET BY MOUTH AT BEDTIME.   donepezil 10 MG tablet Commonly known as:  ARICEPT TAKE 1 TABLET BY MOUTH TWICE A DAY (DEMENTIA)   ferrous sulfate 325 (65 FE) MG tablet Take 325 mg by mouth daily.   FLUoxetine 10 MG tablet Commonly known as:  PROZAC Take 30 mg by mouth every morning.   JANUVIA 100 MG tablet Generic drug:  sitaGLIPtin ONE TABLET BY MOUTH ONCE A DAY. (DIABETES)   ketoconazole 2 % cream Commonly known as:  NIZORAL APPLY TOPICALLY ONCE DAILY   levofloxacin 750 MG tablet Commonly known as:  LEVAQUIN Take 1 tablet (750 mg total) by mouth daily.   lurasidone 20 MG Tabs tablet Commonly known as:  LATUDA Take 1 tablet by mouth every morning.   memantine 28 MG Cp24 24 hr capsule Commonly known as:  NAMENDA XR Take 28 mg by mouth daily.   metFORMIN 500 MG tablet Commonly known as:  GLUCOPHAGE TAKE ONE TABLET BY MOUTH EVERY DAY WITH BREAKFAST   metoprolol succinate 25 MG 24 hr tablet Commonly known as:  TOPROL-XL Take 1 tablet (25 mg total) by mouth daily.   nortriptyline 25 MG capsule Commonly known as:  PAMELOR Take 1 capsule (25 mg total) by mouth at bedtime.   nystatin cream Commonly known as:  MYCOSTATIN Apply topically as needed for dry skin.   omeprazole 20 MG capsule Commonly known as:  PRILOSEC Take 1 capsule (20 mg total) by mouth daily.   polyethylene glycol powder powder Commonly known as:   GLYCOLAX/MIRALAX Take 255 g by mouth once.   PROBIOTIC-10 Caps Take 1 capsule by mouth daily.   simvastatin 10 MG tablet Commonly known as:  ZOCOR TAKE ONE TABLET BY MOUTH AT BEDTIME.   STOOL SOFTENER 100 MG capsule Generic drug:  docusate sodium TAKE 1 CAPSULE BY MOUTH TWICE A DAY. (STOOL SOFTNER) (TAKE WITH GLASSWATER)   Tea Tree 100 % Oil Apply 1 application topically daily. To nails   tolterodine 4 MG 24 hr capsule Commonly known as:  DETROL LA TAKE 1 CAPSULE BY MOUTH ONCE DAILY FOR BLADDER. **DO NOT CRUSH**         Diet and Activity recommendation: See Discharge Instructions above   Consults obtained PT   Major procedures and Radiology Reports - PLEASE review detailed and final reports for all details, in brief -     Portable Chest 1 View  Result Date: 08/16/2016 CLINICAL DATA:  56 year old female former smoker with a history of hypertension and diabetes. Currently admitted with hyperglycemia, sepsis and meningitis. EXAM: PORTABLE CHEST 1 VIEW COMPARISON:  Prior chest x-ray 08/15/2016 FINDINGS: The lungs are clear  and negative for focal airspace consolidation, pulmonary edema or suspicious pulmonary nodule. No pleural effusion or pneumothorax. Cardiac and mediastinal contours are within normal limits. No acute fracture or lytic or blastic osseous lesions. Advanced degenerative changes of both glenohumeral joints. The visualized upper abdominal bowel gas pattern is unremarkable. IMPRESSION: Negative chest x-ray. Electronically Signed   By: Jacqulynn Cadet M.D.   On: 08/16/2016 08:26   Dg Chest Port 1 View  Result Date: 08/15/2016 CLINICAL DATA:  Hyperglycemia, history of traumatic brain injury, hypertension, diabetes mellitus EXAM: PORTABLE CHEST 1 VIEW COMPARISON:  Portable exam 1659 hours compared to 04/20/2015 FINDINGS: Rotated to the LEFT. Normal heart size, mediastinal contours, and pulmonary vascularity. Lungs clear. No pleural effusion or pneumothorax. BILATERAL  glenohumeral degenerative changes and diffuse osseous demineralization. IMPRESSION: No acute abnormalities. Electronically Signed   By: Lavonia Dana M.D.   On: 08/15/2016 17:32   Mm Screening Breast Tomo Bilateral  Result Date: 07/28/2016 CLINICAL DATA:  Screening. EXAM: 2D DIGITAL SCREENING BILATERAL MAMMOGRAM WITH CAD AND ADJUNCT TOMO COMPARISON:  Previous exam(s). ACR Breast Density Category b: There are scattered areas of fibroglandular density. FINDINGS: There are no findings suspicious for malignancy. Images were processed with CAD. IMPRESSION: No mammographic evidence of malignancy. A result letter of this screening mammogram will be mailed directly to the patient. RECOMMENDATION: Screening mammogram in one year. (Code:SM-B-01Y) BI-RADS CATEGORY  1: Negative. Electronically Signed   By: Everlean Alstrom M.D.   On: 07/28/2016 10:50    Micro Results     Recent Results (from the past 240 hour(s))  Urine culture     Status: Abnormal   Collection Time: 08/15/16  4:28 PM  Result Value Ref Range Status   Specimen Description URINE, RANDOM  Final   Special Requests NONE  Final   Culture 70,000 COLONIES/mL ENTEROCOCCUS FAECALIS (A)  Final   Report Status 08/18/2016 FINAL  Final   Organism ID, Bacteria ENTEROCOCCUS FAECALIS (A)  Final      Susceptibility   Enterococcus faecalis - MIC*    AMPICILLIN <=2 SENSITIVE Sensitive     LEVOFLOXACIN 1 SENSITIVE Sensitive     NITROFURANTOIN <=16 SENSITIVE Sensitive     VANCOMYCIN 2 SENSITIVE Sensitive     * 70,000 COLONIES/mL ENTEROCOCCUS FAECALIS  Blood culture (routine x 2)     Status: None   Collection Time: 08/15/16  4:35 PM  Result Value Ref Range Status   Specimen Description BLOOD LEFT ANTECUBITAL  Final   Special Requests   Final    BOTTLES DRAWN AEROBIC AND ANAEROBIC Blood Culture results may not be optimal due to an inadequate volume of blood received in culture bottles   Culture NO GROWTH 5 DAYS  Final   Report Status 08/20/2016 FINAL   Final  Blood culture (routine x 2)     Status: Abnormal   Collection Time: 08/15/16  4:35 PM  Result Value Ref Range Status   Specimen Description BLOOD RIGHT ANTECUBITAL  Final   Special Requests   Final    BOTTLES DRAWN AEROBIC AND ANAEROBIC Blood Culture adequate volume   Culture  Setup Time   Final    GRAM POSITIVE COCCI AEROBIC BOTTLE ONLY CRITICAL RESULT CALLED TO, READ BACK BY AND VERIFIED WITH: JASON ROBBINS 08/17/16 1510 KLW    Culture (A)  Final    STAPHYLOCOCCUS SPECIES (COAGULASE NEGATIVE) THE SIGNIFICANCE OF ISOLATING THIS ORGANISM FROM A SINGLE SET OF BLOOD CULTURES WHEN MULTIPLE SETS ARE DRAWN IS UNCERTAIN. PLEASE NOTIFY THE MICROBIOLOGY DEPARTMENT WITHIN  ONE WEEK IF SPECIATION AND SENSITIVITIES ARE REQUIRED. Performed at [facility], [place] [address], [place], [place] [address]    Report Status 08/20/2016 FINAL  Final  Blood Culture ID Panel (Reflexed)     Status: Abnormal   Collection Time: 08/15/16  4:35 PM  Result Value Ref Range Status   Enterococcus species NOT DETECTED NOT DETECTED Final   Listeria monocytogenes NOT DETECTED NOT DETECTED Final   Staphylococcus species DETECTED (A) NOT DETECTED Final    Comment: Methicillin (oxacillin) susceptible coagulase negative staphylococcus. Possible blood culture contaminant (unless isolated from more than one blood culture draw or clinical case suggests pathogenicity). No antibiotic treatment is indicated for blood  culture contaminants. CRITICAL RESULT CALLED TO, READ BACK BY AND VERIFIED WITH: JASON ROBBINS 08/17/16 1510 KLW    Staphylococcus aureus NOT DETECTED NOT DETECTED Final   Methicillin resistance NOT DETECTED NOT DETECTED Final   Streptococcus species NOT DETECTED NOT DETECTED Final   Streptococcus agalactiae NOT DETECTED NOT DETECTED Final   Streptococcus pneumoniae NOT DETECTED NOT DETECTED Final   Streptococcus pyogenes NOT DETECTED NOT DETECTED Final   Acinetobacter baumannii NOT DETECTED NOT DETECTED  Final   Enterobacteriaceae species NOT DETECTED NOT DETECTED Final   Enterobacter cloacae complex NOT DETECTED NOT DETECTED Final   Escherichia coli NOT DETECTED NOT DETECTED Final   Klebsiella oxytoca NOT DETECTED NOT DETECTED Final   Klebsiella pneumoniae NOT DETECTED NOT DETECTED Final   Proteus species NOT DETECTED NOT DETECTED Final   Serratia marcescens NOT DETECTED NOT DETECTED Final   Haemophilus influenzae NOT DETECTED NOT DETECTED Final   Neisseria meningitidis NOT DETECTED NOT DETECTED Final   Pseudomonas aeruginosa NOT DETECTED NOT DETECTED Final   Candida albicans NOT DETECTED NOT DETECTED Final   Candida glabrata NOT DETECTED NOT DETECTED Final   Candida krusei NOT DETECTED NOT DETECTED Final   Candida parapsilosis NOT DETECTED NOT DETECTED Final   Candida tropicalis NOT DETECTED NOT DETECTED Final  MRSA PCR Screening     Status: None   Collection Time: 08/16/16  5:37 AM  Result Value Ref Range Status   MRSA by PCR NEGATIVE NEGATIVE Final    Comment:        The GeneXpert MRSA Assay (FDA approved for NASAL specimens only), is one component of a comprehensive MRSA colonization surveillance program. It is not intended to diagnose MRSA infection nor to guide or monitor treatment for MRSA infections.        Today   Subjective:   Asal Sabine today,stable for discharge.  Objective:   Blood pressure 126/66, pulse (!) 104, temperature 98.2 F (36.8 C), temperature source Oral, resp. rate 20, height 5\' 6"  (1.676 m), weight 65.4 kg (144 lb 1.6 oz), SpO2 95 %.   Intake/Output Summary (Last 24 hours) at 08/21/16 0947 Last data filed at 08/20/16 1300  Gross per 24 hour  Intake              240 ml  Output                0 ml  Net              240 ml    Exam Awake Alert, Oriented , awake.  Supple Neck,No JVD, No cervical lymphadenopathy appreciated.  Symmetrical Chest wall movement, Good air movement bilaterally, CTAB RRR,No Gallops,Rubs or new Murmurs, No  Parasternal Heave +ve B.Sounds, Abd Soft, Non tender, No organomegaly appriciated, No rebound -guarding or rigidity. No Cyanosis, Clubbing or edema  Data Review   CBC w Diff:  Lab Results  Component Value Date   WBC 7.6 08/19/2016   HGB 9.0 (L) 08/19/2016   HGB 8.7 (L) 06/12/2013   HCT 26.8 (L) 08/19/2016   HCT 26.7 (L) 06/12/2013   PLT 245 08/19/2016   PLT 164 06/12/2013   LYMPHOPCT 24 08/19/2016   LYMPHOPCT 26.7 06/12/2013   MONOPCT 8 08/19/2016   MONOPCT 9.2 06/12/2013   EOSPCT 3 08/19/2016   EOSPCT 3.8 06/12/2013   BASOPCT 1 08/19/2016   BASOPCT 0.9 06/12/2013    CMP:  Lab Results  Component Value Date   NA 139 08/16/2016   NA 139 10/16/2015   NA 142 06/11/2013   K 4.1 08/16/2016   K 3.4 (L) 06/11/2013   CL 109 08/16/2016   CL 112 (H) 06/11/2013   CO2 24 08/16/2016   CO2 25 06/11/2013   BUN 27 (H) 08/16/2016   BUN 9 10/16/2015   BUN 12 06/11/2013   CREATININE 0.72 08/17/2016   CREATININE 0.94 06/11/2013   PROT 5.5 (L) 08/16/2016   PROT 6.8 10/16/2015   PROT 7.8 06/09/2013   ALBUMIN 3.0 (L) 08/16/2016   ALBUMIN 4.0 10/16/2015   ALBUMIN 3.3 (L) 06/09/2013   BILITOT 0.6 08/16/2016   BILITOT 0.3 10/16/2015   BILITOT 0.2 06/09/2013   ALKPHOS 64 08/16/2016   ALKPHOS 103 06/09/2013   AST 20 08/16/2016   AST 21 06/09/2013   ALT 17 08/16/2016   ALT 17 06/09/2013  .   Total Time in preparing paper work, data evaluation and todays exam - 35 minutes  Neita Carp M.D on 08/21/2016 at 9:47 AM    Note: This dictation was prepared with Dragon dictation along with smaller phrase technology. Any transcriptional errors that result from this process are unintentional.

## 2016-08-17 NOTE — Progress Notes (Signed)
PHARMACY - PHYSICIAN COMMUNICATION CRITICAL VALUE ALERT - BLOOD CULTURE IDENTIFICATION (BCID)  Results for orders placed or performed during the hospital encounter of 08/15/16  Blood Culture ID Panel (Reflexed) (Collected: 08/15/2016  4:35 PM)  Result Value Ref Range   Enterococcus species NOT DETECTED NOT DETECTED   Listeria monocytogenes NOT DETECTED NOT DETECTED   Staphylococcus species DETECTED (A) NOT DETECTED   Staphylococcus aureus NOT DETECTED NOT DETECTED   Methicillin resistance NOT DETECTED NOT DETECTED   Streptococcus species NOT DETECTED NOT DETECTED   Streptococcus agalactiae NOT DETECTED NOT DETECTED   Streptococcus pneumoniae NOT DETECTED NOT DETECTED   Streptococcus pyogenes NOT DETECTED NOT DETECTED   Acinetobacter baumannii NOT DETECTED NOT DETECTED   Enterobacteriaceae species NOT DETECTED NOT DETECTED   Enterobacter cloacae complex NOT DETECTED NOT DETECTED   Escherichia coli NOT DETECTED NOT DETECTED   Klebsiella oxytoca NOT DETECTED NOT DETECTED   Klebsiella pneumoniae NOT DETECTED NOT DETECTED   Proteus species NOT DETECTED NOT DETECTED   Serratia marcescens NOT DETECTED NOT DETECTED   Haemophilus influenzae NOT DETECTED NOT DETECTED   Neisseria meningitidis NOT DETECTED NOT DETECTED   Pseudomonas aeruginosa NOT DETECTED NOT DETECTED   Candida albicans NOT DETECTED NOT DETECTED   Candida glabrata NOT DETECTED NOT DETECTED   Candida krusei NOT DETECTED NOT DETECTED   Candida parapsilosis NOT DETECTED NOT DETECTED   Candida tropicalis NOT DETECTED NOT DETECTED    Name of physician (or Provider) Contacted: Konidena  Changes to prescribed antibiotics required: No, will continue pt on current regimen.   Mikka Kissner D 08/17/2016  3:28 PM

## 2016-08-17 NOTE — Consult Note (Signed)
Pharmacy Antibiotic Note  [name] [name] is a 56 y.o. female admitted on 08/15/2016 with sepsis and meningitis.  Pharmacy has been consulted for vancomycin and levofloxacin dosing.  Plan: VT = 15 mcg/mL. Will continue current regimen of vancomycin 1000 mg IV q12h. Pharmacy to follow renal function and determine when to order next VT   Continue levofloxacin 750mg  IV Q24H  Patient is also receiving ceftriaxone 2gm IV Q12H   Height: 5\' 6"  (167.6 cm) Weight: 152 lb 11.2 oz (69.3 kg) IBW/kg (Calculated) : 59.3  Temp (24hrs), Avg:98.9 F (37.2 C), Min:98.6 F (37 C), Max:99.3 F (37.4 C)   Recent Labs Lab 08/15/16 1517 08/15/16 1635 08/15/16 1700 08/15/16 1912 08/16/16 0359 08/17/16 0402 08/17/16 1750  WBC 27.1*  --  26.8*  --  12.3* 8.2  --   CREATININE 1.10*  --   --   --  0.84  --  0.72  LATICACIDVEN  --  2.7*  --  2.5*  --   --   --   VANCOTROUGH  --   --   --   --   --   --  15    Estimated Creatinine Clearance: 72.6 mL/min (by C-G formula based on SCr of 0.72 mg/dL).    Allergies  Allergen Reactions  . Dilantin [Phenytoin] Other (See Comments)    unknown  . Penicillins Other (See Comments)    .Has patient had a PCN reaction causing immediate rash, facial/tongue/throat swelling, SOB or lightheadedness with hypotension: Unknown Has patient had a PCN reaction causing severe rash involving mucus membranes or skin necrosis: Unknown Has patient had a PCN reaction that required hospitalization: Unknown Has patient had a PCN reaction occurring within the last 10 years: Unknown If all of the above answers are "NO", then may proceed with Cephalosporin use.   Marland Kitchen Phenytoin Sodium Extended Other (See Comments)    Other reaction(s): Other (See Comments)  . Phenytoin Sodium Extended Other (See Comments)    Other reaction(s): Other (See Comments)  . Sulfa Antibiotics Rash and Other (See Comments)    unknown    Antimicrobials this admission: Ceftriaxone 7/22 >> levofloxacin  7/21 >>  aztreonam 7/21 >> 7/22 Vancomycin 7/21>>  Dose adjustments this admission:  Microbiology results: 7/21 BCx: staph species 7/21 UCx:  70K colonies enterococcus faecalis 7/22 MRSA PCR negative   Thank you for allowing pharmacy to be a part of this patient's care.  Lenis Noon, PharmD Clinical Pharmacist 08/17/2016 7:34 PM

## 2016-08-17 NOTE — Progress Notes (Addendum)
[name] NAME: [name] [name]    MR#:  [id number]  DATE OF BIRTH:  1960/07/16  SUBJECTIVE: No fever. No other complaints reported. Physical therapy recommended skilled nursing.   CHIEF COMPLAINT:   Chief Complaint  Patient presents with  . Hyperglycemia    REVIEW OF SYSTEMS:   Review of Systems  Unable to perform ROS: Medical condition     DRUG ALLERGIES:   Allergies  Allergen Reactions  . Dilantin [Phenytoin] Other (See Comments)    unknown  . Penicillins Other (See Comments)    .Has patient had a PCN reaction causing immediate rash, facial/tongue/throat swelling, SOB or lightheadedness with hypotension: Unknown Has patient had a PCN reaction causing severe rash involving mucus membranes or skin necrosis: Unknown Has patient had a PCN reaction that required hospitalization: Unknown Has patient had a PCN reaction occurring within the last 10 years: Unknown If all of the above answers are "NO", then may proceed with Cephalosporin use.   Marland Kitchen Phenytoin Sodium Extended Other (See Comments)    Other reaction(s): Other (See Comments)  . Phenytoin Sodium Extended Other (See Comments)    Other reaction(s): Other (See Comments)  . Sulfa Antibiotics Rash and Other (See Comments)    unknown    VITALS:  Blood pressure 117/68, pulse (!) 110, temperature 98.6 F (37 C), temperature source Oral, resp. rate 16, height 5\' 6"  (1.676 m), weight 69.3 kg (152 lb 11.2 oz), SpO2 94 %.  PHYSICAL EXAMINATION:  GENERAL:  56 y.o.-year-old patient lying in the bed with no acute distress.  EYES: Pupils equal, round, reactive to light . No scleral icterus. Extraocular muscles intact.  HEENT: Head atraumatic, normocephalic. Oropharynx and nasopharynx clear.  NECK:  Supple, no jugular venous distention. No thyroid enlargement, no tenderness. No neck stiffness but LUNGS: Normal breath sounds bilaterally, no wheezing, rales,rhonchi or  crepitation. No use of accessory muscles of respiration.  CARDIOVASCULAR: S1, S2 normal. No murmurs, rubs, or gallops.  ABDOMEN: Soft, nontender, nondistended. Bowel sounds present. No organomegaly or mass.  EXTREMITIES: No pedal edema, cyanosis, or clubbing.  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 SKIN: No obvious rash, lesion, or ulcer.    LABORATORY PANEL:   CBC  Recent Labs Lab 08/17/16 0402  WBC 8.2  HGB 9.2*  HCT 27.3*  PLT 210   ------------------------------------------------------------------------------------------------------------------  Chemistries   Recent Labs Lab 08/15/16 1700 08/16/16 0359  NA  --  139  K  --  4.1  CL  --  109  CO2  --  24  GLUCOSE  --  124*  BUN  --  27*  CREATININE  --  0.84  CALCIUM  --  8.2*  MG 1.7  --   AST 23 20  ALT 20 17  ALKPHOS 96 64  BILITOT 0.6 0.6   ------------------------------------------------------------------------------------------------------------------  Cardiac Enzymes  Recent Labs Lab 08/15/16 1650  TROPONINI <0.03   ------------------------------------------------------------------------------------------------------------------  RADIOLOGY:  Portable Chest 1 View  Result Date: 08/16/2016 CLINICAL DATA:  56 year old female former smoker with a history of hypertension and diabetes. Currently admitted with hyperglycemia, sepsis and meningitis. EXAM: PORTABLE CHEST 1 VIEW COMPARISON:  Prior chest x-ray 08/15/2016 FINDINGS: The lungs are clear and negative for focal airspace consolidation, pulmonary edema or suspicious pulmonary nodule. No pleural effusion or pneumothorax. Cardiac and mediastinal contours are within normal limits. No acute fracture or lytic or blastic osseous  lesions. Advanced degenerative changes of both glenohumeral joints. The visualized upper abdominal bowel gas pattern is unremarkable.  IMPRESSION: Negative chest x-ray. Electronically Signed   By: Jacqulynn Cadet M.D.   On: 08/16/2016 08:26   Dg Chest Port 1 View  Result Date: 08/15/2016 CLINICAL DATA:  Hyperglycemia, history of traumatic brain injury, hypertension, diabetes mellitus EXAM: PORTABLE CHEST 1 VIEW COMPARISON:  Portable exam 1659 hours compared to 04/20/2015 FINDINGS: Rotated to the LEFT. Normal heart size, mediastinal contours, and pulmonary vascularity. Lungs clear. No pleural effusion or pneumothorax. BILATERAL glenohumeral degenerative changes and diffuse osseous demineralization. IMPRESSION: No acute abnormalities. Electronically Signed   By: Lavonia Dana M.D.   On: 08/15/2016 17:32    EKG:   Orders placed or performed during the hospital encounter of 08/15/16  . ED EKG  . ED EKG  . EKG 12-Lead  . EKG 12-Lead    ASSESSMENT AND PLAN:   Sepsis likely due to meningitis: LP was unsuccessful in the emergency room. Patient received Vanco, Azactam, Levaquin. One dose of acyclovir given in the emergency room. neuro consult requested. WBC 26 yesterday, today 12.3. Elevated lactic acid, elevated pro calcitonin on admission.Blood cultures are negative. Continue IV antibiotics for 1 more day, likely discharge to skilled nursing tomorrow, discharge her with Levaquin 750 daily for 5 days. Patient is on vancomycin, Levaquin, Rocephin. #2 history of traumatic brain injury. #3 essential hypertension; improved. Discontinue IV fluids, encourage by mouth intake.  #4 diabetes mellitus type 2: Continue Januvia, metformin. #5 TBI with dementia: Continue fluoxetine, Pamelor, Namenda. #6 deconditioning: Physical therapy recommended skilled nursing. Patient from group home.  SW working on  Social research officer, government, likely discharge tomorrow   CODE STATUS full code  All the records are reviewed and case discussed with Care Management/Social Workerr. Management plans discussed with the patient, family and they are in agreement.  CODE  STATUS: full  TOTAL TIME TAKING CARE OF THIS PATIENT 35 minutes.   POSSIBLE D/C IN 1-2 DAYS, DEPENDING ON CLINICAL CONDITION.   Epifanio Lesches M.D on 08/17/2016 at 11:03 AM  Between 7am to 6pm - Pager - [phone]  After 6pm go to [email or web address] - password [email or web address] [facility]  [facility]  Office  [phone]  CC: Primary care physician; Roselee Nova, MD   Note: This dictation was prepared with Dragon dictation along with smaller phrase technology. Any transcriptional errors that result from this process are unintentional.

## 2016-08-17 NOTE — Evaluation (Signed)
Occupational Therapy Evaluation Patient Details Name: [name] [name] MRN: [id number] DOB: [birth year] Today's Date: 08/17/2016    History of Present Illness Pt is a 56 y/o F who presented with weakness and anorexia.  Chest x-ray and UA negative.  Procalcitonin elevated consistent with sepsis.  Pt admitted for sepsis, possible meningitis.  Pt's PMH includes TBI, speech difficulties, osteoporosis, double vision, spastic quadriparesis.   Clinical Impression   Pt seen for OT evaluation this date. Pt living in group home, requiring assist from staff for bathing and transfers to/from wheelchair. Pt presents with impairments in strength, activity tolerance, and UE functional use requiring increased assist for all aspects of self care. Pt R hand noted to be in flexed position generally unless cued to open. Pt would benefit from a splint to support a more neurtral functional position of the wrist and fingers overnight (and possibly some during the day) in order to maximize functional use and minimize risk of contracture/injury. Pt educated in positioning to support functional use, as pt is right handed. Pt verbalized understanding. Functional mobility deferred this session due to fatigue. Pt will benefit from skilled OT Services to address noted impairments and functional deficits in order to maximize return to PLOF and minimize risk of future falls/injury/rehospitalization/increased caregiver burden/assist required. Recommend STR following hospitalization prior to return to group home.    Follow Up Recommendations  SNF    Equipment Recommendations  3 in 1 bedside commode;Other (comment) (R resting hand splint)    Recommendations for Other Services       Precautions / Restrictions Precautions Precautions: Fall Restrictions Weight Bearing Restrictions: No      Mobility Bed Mobility                  Transfers                      Balance                                            ADL either performed or assessed with clinical judgement   ADL Overall ADL's : Needs assistance/impaired Eating/Feeding: Set up;Minimal assistance;Supervision/ safety;Bed level Eating/Feeding Details (indicate cue type and reason): initial set up and min assist decreasing to min guard and supervision for self feeding nectar thick liquids and pudding Grooming: Set up;Bed level   Upper Body Bathing: Moderate assistance;Bed level   Lower Body Bathing: Maximal assistance;Bed level   Upper Body Dressing : Moderate assistance;Bed level   Lower Body Dressing: Bed level;Maximal assistance                       Vision Baseline Vision/History: Wears glasses (diplopia) Wears Glasses: At all times Patient Visual Report: Diplopia (since TBI in 1970's) Vision Assessment?: Yes Eye Alignment: Impaired (comment) (R eye exotropia, 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 Pt notes she does not have a splint (previous date indicated she did), some spasticity noted in  RUE LUE Deficits / Details: Strength grossly 2+/5, impaired shoulder flexion with pain, AAROM grossly WFL for shoulder with less pain (pt notes this is from TBI), good grip strength   Lower Extremity Assessment Lower Extremity Assessment: Defer to PT evaluation;Generalized weakness (grossly 3-/5 bilaterally, hx of spastic quadriparesis)   Cervical / Trunk Assessment Cervical / Trunk Assessment: Kyphotic   Communication Communication Communication: Other (comment) (difficulty due  to previous TBI)   Cognition Arousal/Alertness: Awake/alert Behavior During Therapy: Flat affect Overall Cognitive Status: History of cognitive impairments - at baseline                                 General Comments: Pt able to answer yes no and short open ended questions.  Pt's responses conflicted at times but for the most part is able to answer questions correctly.   General Comments       Exercises     Shoulder Instructions      Home Living Family/patient expects to be discharged to:: Group home Living Arrangements: Non-relatives/Friends;Group Home Available Help at Discharge: Available 24 hours/day (group home staff) Type of Home: Group Home Home Access: Ramped entrance     Home Layout: One level               Home Equipment: Wheelchair - manual   Additional Comments: Information taken from pt, unsure of reliability      Prior Functioning/Environment Level of Independence: Needs assistance  Gait / Transfers Assistance Needed: Pt reports now that she needed "lots of help" with stand pivot transfers to [facility] (previous date she indicated that she was independent with SPTs to [facility]).  Denies any falls over the past 6 months.   ADL's / Homemaking Assistance Needed: Pt now reports she gets assist for a seated shower from staff (previous date told PT she takes a sponge bath in the shower and does this without assist).  She reports she is independent with dressing.  Meals are provided by the home.  Communication / Swallowing Assistance Needed: Difficult speech due to h/o TBI          OT Problem List: Decreased strength;Decreased activity tolerance;Impaired tone;Impaired UE functional use      OT Treatment/Interventions: Self-care/ADL training;Therapeutic exercise;Therapeutic activities;Energy conservation;DME and/or AE instruction;Patient/family education    OT Goals(Current goals can be found in the care plan section) Acute Rehab OT Goals Patient Stated  Goal: feel better OT Goal Formulation: With patient Time For Goal Achievement: 08/31/16 Potential to Achieve Goals: Good  OT Frequency: Min 1X/week   Barriers to D/C:            Co-evaluation              AM-PAC PT "6 Clicks" Daily Activity     Outcome Measure Help from another person eating meals?: A Little Help from another person taking care of personal grooming?: A Little Help from another person toileting, which includes using toliet, bedpan, or urinal?: A Lot Help from another person bathing (including washing, rinsing, drying)?: A Lot Help from another person to put on and taking off regular upper body clothing?: A Lot Help from another person to put on and taking off regular lower body clothing?: A Lot 6 Click Score: 14   End of Session    Activity Tolerance: Patient limited by fatigue Patient left: in bed;with call bell/phone within reach;with bed alarm set  OT Visit Diagnosis: Other abnormalities of  gait and mobility (R26.89);Muscle weakness (generalized) (M62.81)                Time: 8527-7824 OT Time Calculation (min): 25 min Charges:  OT General Charges $OT Visit: 1 Procedure OT Evaluation $OT Eval Moderate Complexity: 1 Procedure G-Codes:     Jeni Salles, MPH, MS, OTR/L ascom [phone] 08/17/16, 3:51 PM

## 2016-08-17 NOTE — Progress Notes (Signed)
Joe PT at [facility] came to assess patient today and determined that [facility] can't meet her needs and she needs short term rehab at a SNF. Clinical Education officer, museum (CSW) contacted patient's sister/ guardian Zena Amos and made her aware of above and presented bed offers. [facility] liaison is aware of accepted bed offer. [facility] is pending. CSW will continue to follow and assist as needed.   [employer], LCSW [phone]

## 2016-08-17 NOTE — Progress Notes (Signed)
OT Cancellation Note  Patient Details Name: TEMECA SOMMA MRN: [id number] DOB: 1960/02/02   Cancelled Treatment:    Reason Eval/Treat Not Completed: Patient at procedure or test/ unavailable (Pt. is process of having a BM.)  Harrel Carina, MS, OTR/L 08/17/2016, 11:22 AM

## 2016-08-17 NOTE — Plan of Care (Signed)
Problem: Physical Regulation: Goal: Ability to maintain clinical measurements within normal limits will improve Outcome: Progressing No reports of pain this shift.

## 2016-08-17 NOTE — Progress Notes (Signed)
Clinical Education officer, museum (CSW) contacted Education officer, museum for [employer] and made her aware PT is recommending SNF. Per Butch Penny patient is a 1 assist at baseline and she will send [facility] PT Joe around lunch time today to assess patient to see if [facility] can meet her needs.   [employer], LCSW [phone]

## 2016-08-18 LAB — URINE CULTURE: Culture: 70000 — AB

## 2016-08-18 LAB — GLUCOSE, CAPILLARY
GLUCOSE-CAPILLARY: 142 mg/dL — AB (ref 65–99)
GLUCOSE-CAPILLARY: 113 mg/dL — AB (ref 65–99)
Glucose-Capillary: 148 mg/dL — ABNORMAL HIGH (ref 65–99)
Glucose-Capillary: 128 mg/dL — ABNORMAL HIGH (ref 65–99)

## 2016-08-18 MED ORDER — FAMOTIDINE 20 MG PO TABS
20.0000 mg | ORAL_TABLET | Freq: Two times a day (BID) | ORAL | Status: DC
  Administered 2016-08-18 – 2016-08-21 (×7): 20 mg via ORAL
  Filled 2016-08-18 (×7): qty 1

## 2016-08-18 NOTE — Progress Notes (Signed)
CONCERNING: IV to Oral Route Change Policy  RECOMMENDATION: This patient is receiving famotidine by the intravenous route.  Based on criteria approved by the Pharmacy and Therapeutics Committee, the intravenous medication(s) is/are being converted to the equivalent oral dose form(s).   DESCRIPTION: These criteria include:  The patient is eating (either orally or via tube) and/or has been taking other orally administered medications for a least 24 hours  The patient has no evidence of active gastrointestinal bleeding or impaired GI absorption (gastrectomy, short bowel, patient on TNA or NPO).  If you have questions about this conversion, please contact the Pharmacy Department  []   [phone] )  [name] [name] [x]   [phone] )  [name] [name], [name] [name] []   [phone] )  [name] [name] []   [phone] )  [name] [name] []   [phone] )  [name] [name], [name] [name] 08/18/2016 10:21 AM

## 2016-08-18 NOTE — Progress Notes (Signed)
Physical Therapy Treatment Patient Details Name: [name] [name] MRN: [id number] DOB: 05-28-1960 Today's Date: 08/18/2016    History of Present Illness Pt is a 56 y/o F who presented with weakness and anorexia.  Chest x-ray and UA negative.  Procalcitonin elevated consistent with sepsis.  Pt admitted for sepsis, possible meningitis.  Pt's PMH includes TBI, speech difficulties, osteoporosis, double vision, spastic quadriparesis.    PT Comments    Pt continues to require assist for bed mobility and transfers. Attempted marches in standing but pt is too weak to remain upright. Pt too unsafe to attempt ambulation and she is non-ambulatory at baseline. She is able to complete supine bed exercises with therapist on this date. Pt will benefit from PT services to address deficits in strength, balance, and mobility in order to return to full function at home.    Follow Up Recommendations  SNF     Equipment Recommendations  Other (comment) (TBD at next venue of care)    Recommendations for Other Services       Precautions / Restrictions Precautions Precautions: Fall Restrictions Weight Bearing Restrictions: No    Mobility  Bed Mobility Overal bed mobility: Needs Assistance Bed Mobility: Supine to Sit;Sit to Supine     Supine to sit: Mod assist Sit to supine: Mod assist   General bed mobility comments: Cues for sequencing, HOB elevated and bed rails. Increased time required to perform. Assist to advance LEs to EOB and shift weight laterally to scoot  Transfers Overall transfer level: Needs assistance Equipment used: None Transfers: Sit to/from [facility] Sit to Stand: Max assist         General transfer comment: Pt requires maxA+1 to come to standing however less assist required as pt becomes more upright. Once in standing pt requires min/modA for stabilty. Attempted standing marches however pt with considerable instability with marching experiencing LE buckling  and descending back onto bed  Ambulation/Gait             General Gait Details: Pt non ambulatory.  Not safe to attempt at this time.   Stairs            Wheelchair Mobility    Modified Rankin (Stroke Patients Only)       Balance Overall balance assessment: Needs assistance Sitting-balance support: Feet supported Sitting balance-Leahy Scale: Poor Sitting balance - Comments: Pt relies on BUE support while demonstrating posterior lean while seated EOB.   Standing balance support: Bilateral upper extremity supported Standing balance-Leahy Scale: Poor Standing balance comment: min to modA+1 with back of legs supported on bed to remain in standing                            Cognition Arousal/Alertness: Awake/alert Behavior During Therapy: WFL for tasks assessed/performed Overall Cognitive Status: History of cognitive impairments - at baseline                                 General Comments: AOx3 at time of treatment. Answer mostly seem appropriate to questions      Exercises General Exercises - Lower Extremity Ankle Circles/Pumps: Both;10 reps;AROM;Supine Quad Sets: Strengthening;Both;10 reps;Supine Short Arc Quad: Strengthening;Both;10 reps;Supine Heel Slides: Strengthening;Both;10 reps;Supine Hip ABduction/ADduction: Strengthening;Both;10 reps;Supine Straight Leg Raises: Strengthening;Both;10 reps;Supine Other Exercises Other Exercises: gentle ROM and stretching to R hand performed in preparation for trialing hand orthosis. Spoke with supply, no resting  hand splint orthosis. Wrist support brace trialed but not ideal given that the slight wrist extension increased finger flexion. Spoke with nursing about need for resting hand orthosis that supports wrist and fingers with thumb in abduction. Will check back in with nursing tomorrow morning.    General Comments        Pertinent Vitals/Pain Pain Assessment: No/denies pain    Home  Living                      Prior Function            PT Goals (current goals can now be found in the care plan section) Acute Rehab PT Goals Patient Stated Goal: feel better Time For Goal Achievement: 08/30/16 Potential to Achieve Goals: Good Progress towards PT goals: Progressing toward goals    Frequency    Min 2X/week      PT Plan Current plan remains appropriate    Co-evaluation              AM-PAC PT "6 Clicks" Daily Activity  Outcome Measure  Difficulty turning over in bed (including adjusting bedclothes, sheets and blankets)?: Total Difficulty moving from lying on back to sitting on the side of the bed? : Total Difficulty sitting down on and standing up from a chair with arms (e.g., wheelchair, bedside commode, etc,.)?: Total Help needed moving to and from a bed to chair (including a wheelchair)?: A Lot Help needed walking in hospital room?: Total Help needed climbing 3-5 steps with a railing? : Total 6 Click Score: 7    End of Session Equipment Utilized During Treatment: Gait belt Activity Tolerance: Patient tolerated treatment well Patient left: with call bell/phone within reach;in bed;with bed alarm set   PT Visit Diagnosis: Muscle weakness (generalized) (M62.81);Unsteadiness on feet (R26.81)     Time: 8413-2440 PT Time Calculation (min) (ACUTE ONLY): 12 min  Charges:  $Therapeutic Exercise: 8-22 mins                    G Codes:      Lyndel Safe Huprich PT, DPT     Huprich,Jason 08/18/2016, 5:16 PM

## 2016-08-18 NOTE — Care Management Important Message (Signed)
Important Message  Patient Details  Name: [name] [name] MRN: [id number] Date of Birth: 1960/04/09   Medicare Important Message Given:  Yes    Jolly Mango, RN 08/18/2016, 8:28 AM

## 2016-08-18 NOTE — Progress Notes (Signed)
Occupational Therapy Treatment Patient Details Name: [name] [name] MRN: [id number] DOB: 04-26-1960 Today's Date: 08/18/2016    History of present illness Pt is a 56 y/o F who presented with weakness and anorexia.  Chest x-ray and UA negative.  Procalcitonin elevated consistent with sepsis.  Pt admitted for sepsis, possible meningitis.  Pt's PMH includes TBI, speech difficulties, osteoporosis, double vision, spastic quadriparesis.   OT comments  Pt seen for OT treatment this session. Gentle ROM and stretching to R wrist/digits performed in preparation for trialing hand orthosis. Spoke with supply, no resting hand orthosis available. Wrist support brace trialed but not ideal given that the slight wrist extension increased finger flexion. Spoke with nursing about need for resting hand orthosis that supports wrist and fingers with thumb in abduction. RN verbalized plan to check into the possibility of getting this. Will check back in with nursing tomorrow.   Follow Up Recommendations  SNF    Equipment Recommendations  3 in 1 bedside commode;Other (comment) (R resting hand orthosis (likely size large))    Recommendations for Other Services      Precautions / Restrictions Precautions Precautions: Fall Restrictions Weight Bearing Restrictions: No       Mobility Bed Mobility                  Transfers                      Balance                                           ADL either performed or assessed with clinical judgement   ADL Overall ADL's : Needs assistance/impaired     Grooming: Wash/dry hands;Wash/dry face;Oral care;Bed level;Brushing hair;Set up;Supervision/safety Grooming Details (indicate cue type and reason): pt required set up and supervision for washing face with wash cloth and brushing teeth with L hand (non dominant), max assist for combing hair, per pt she doesn't do this normally on her own                                      Vision       Perception     Praxis      Cognition Arousal/Alertness: Awake/alert Behavior During Therapy: WFL for tasks assessed/performed Overall Cognitive Status: History of cognitive impairments - at baseline                                          Exercises Other Exercises Other Exercises: gentle ROM and stretching to R hand performed in preparation for trialing hand orthosis. Spoke with supply, no resting hand splint orthosis. Wrist support brace trialed but not ideal given that the slight wrist extension increased finger flexion. Spoke with nursing about need for resting hand orthosis that supports wrist and fingers with thumb in abduction. Will check back in with nursing tomorrow morning.   Shoulder Instructions       General Comments      Pertinent Vitals/ Pain       Pain Assessment: No/denies pain  Home Living  Prior Functioning/Environment              Frequency  Min 1X/week        Progress Toward Goals  OT Goals(current goals can now be found in the care plan section)  Progress towards OT goals: OT to reassess next treatment  Acute Rehab OT Goals Patient Stated Goal: feel better OT Goal Formulation: With patient  Plan Discharge plan remains appropriate;Frequency remains appropriate    Co-evaluation                 AM-PAC PT "6 Clicks" Daily Activity     Outcome Measure   Help from another person eating meals?: A Little Help from another person taking care of personal grooming?: A Little Help from another person toileting, which includes using toliet, bedpan, or urinal?: A Lot Help from another person bathing (including washing, rinsing, drying)?: A Lot Help from another person to put on and taking off regular upper body clothing?: A Lot Help from another person to put on and taking off regular lower body clothing?: A Lot 6 Click Score: 14     End of Session    OT Visit Diagnosis: Other abnormalities of gait and mobility (R26.89);Muscle weakness (generalized) (M62.81)   Activity Tolerance Patient tolerated treatment well   Patient Left in bed;with call bell/phone within reach;with bed alarm set   Nurse Communication Other (comment) (need resting hand splint)        Time: 1405-1430 OT Time Calculation (min): 25 min  Charges: OT General Charges $OT Visit: 1 Procedure OT Treatments $Therapeutic Exercise: 8-22 mins $Orthotics Fit/Training: 8-22 mins  Jeni Salles, MPH, MS, OTR/L ascom [phone] 08/18/16, 4:06 PM

## 2016-08-18 NOTE — Progress Notes (Signed)
[place] at [place] NAME: Ivory Maduro    MR#:  [id number]  DATE OF BIRTH:  1960-10-12  SUBJECTIVE: Discharge canceled today due to pending PASSR.  CHIEF COMPLAINT:   Chief Complaint  Patient presents with  . Hyperglycemia    REVIEW OF SYSTEMS:   Review of Systems  Unable to perform ROS: Medical condition     DRUG ALLERGIES:   Allergies  Allergen Reactions  . Dilantin [Phenytoin] Other (See Comments)    unknown  . Penicillins Other (See Comments)    .Has patient had a PCN reaction causing immediate rash, facial/tongue/throat swelling, SOB or lightheadedness with hypotension: Unknown Has patient had a PCN reaction causing severe rash involving mucus membranes or skin necrosis: Unknown Has patient had a PCN reaction that required hospitalization: Unknown Has patient had a PCN reaction occurring within the last 10 years: Unknown If all of the above answers are "NO", then may proceed with Cephalosporin use.   Marland Kitchen Phenytoin Sodium Extended Other (See Comments)    Other reaction(s): Other (See Comments)  . Phenytoin Sodium Extended Other (See Comments)    Other reaction(s): Other (See Comments)  . Sulfa Antibiotics Rash and Other (See Comments)    unknown    VITALS:  Blood pressure (!) 106/57, pulse (!) 111, temperature 99 F (37.2 C), temperature source Oral, resp. rate 16, height 5\' 6"  (1.676 m), weight 69.4 kg (153 lb 1.6 oz), SpO2 93 %.  PHYSICAL EXAMINATION:  GENERAL:  56 y.o.-year-old patient lying in the bed with no acute distress.  EYES: Pupils equal, round, reactive to light . No scleral icterus. Extraocular muscles intact.  HEENT: Head atraumatic, normocephalic. Oropharynx and nasopharynx clear.  NECK:  Supple, no jugular venous distention. No thyroid enlargement, no tenderness. No neck stiffness but LUNGS: Normal breath sounds bilaterally, no wheezing, rales,rhonchi or crepitation. No use of accessory muscles of  respiration.  CARDIOVASCULAR: S1, S2 normal. No murmurs, rubs, or gallops.  ABDOMEN: Soft, nontender, nondistended. Bowel sounds present. No organomegaly or mass.  EXTREMITIES: No pedal edema, cyanosis, or clubbing.  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 SKIN: No obvious rash, lesion, or ulcer.    LABORATORY PANEL:   CBC  Recent Labs Lab 08/17/16 0402  WBC 8.2  HGB 9.2*  HCT 27.3*  PLT 210   ------------------------------------------------------------------------------------------------------------------  Chemistries   Recent Labs Lab 08/15/16 1700 08/16/16 0359 08/17/16 1750  NA  --  139  --   K  --  4.1  --   CL  --  109  --   CO2  --  24  --   GLUCOSE  --  124*  --   BUN  --  27*  --   CREATININE  --  0.84 0.72  CALCIUM  --  8.2*  --   MG 1.7  --   --   AST 23 20  --   ALT 20 17  --   ALKPHOS 96 64  --   BILITOT 0.6 0.6  --    ------------------------------------------------------------------------------------------------------------------  Cardiac Enzymes  Recent Labs Lab 08/15/16 1650  TROPONINI <0.03   ------------------------------------------------------------------------------------------------------------------  RADIOLOGY:  No results found.  EKG:   Orders placed or performed during the hospital encounter of 08/15/16  . ED EKG  . ED EKG  . EKG 12-Lead  . EKG 12-Lead    ASSESSMENT AND PLAN:   Sepsis likely  due to meningitis: LP was unsuccessful in the emergency room. Patient received Vanco, Azactam, Levaquin. One dose of acyclovir given in the emergency room. neuro consult requested. WBC 26 yesterday, today 12.3. Elevated lactic acid, elevated pro calcitonin on admission.Blood cultures are negative. Continue IV antibiotics for 1 more day, likely discharge to skilled nursing tomorrow, discharge her with Levaquin 750 daily for 5 days. Patient is  on vancomycin, Levaquin, Rocephin. #2 history of traumatic brain injury. #3 essential hypertension; improved. Discontinue IV fluids, encourage by mouth intake.  #4 diabetes mellitus type 2: Continue Januvia, metformin. #5 TBI with dementia: Continue fluoxetine, Pamelor, Namenda. #6 deconditioning: Physical therapy recommended skilled nursing. Patient from group home.  SW working on  Social research officer, government, likely discharge when [employer] available   CODE STATUS full code  All the records are reviewed and case discussed with Care Management/Social Workerr. Management plans discussed with the patient, family and they are in agreement.  CODE STATUS: full  TOTAL TIME TAKING CARE OF THIS PATIENT 35 minutes.   POSSIBLE D/C IN 1-2 DAYS, DEPENDING ON CLINICAL CONDITION.   Epifanio Lesches M.D on 08/18/2016 at 9:01 AM  Between 7am to 6pm - Pager - [phone]  After 6pm go to [email or web address] - password [email or web address] [facility]  [employer] [facility]  Office  [phone]  CC: Primary care physician; Roselee Nova, MD   Note: This dictation was prepared with Dragon dictation along with smaller phrase technology. Any transcriptional errors that result from this process are unintentional.

## 2016-08-18 NOTE — Progress Notes (Signed)
Resting hand splint for patient's right hand has been ordered and requested from [facility]. OT requesting this splint for the patient.   Deri Fuelling, RN

## 2016-08-18 NOTE — Progress Notes (Addendum)
Level 2 PASARR is pending. Patient can D/C to [facility] when medically stable and PASARR received. [facility] liaison is aware of above. Patient's guardian/ sister Linwood Dibbles is aware of above.   [employer], LCSW [phone]

## 2016-08-19 ENCOUNTER — Other Ambulatory Visit: Payer: Self-pay | Admitting: Family Medicine

## 2016-08-19 DIAGNOSIS — E1121 Type 2 diabetes mellitus with diabetic nephropathy: Secondary | ICD-10-CM

## 2016-08-19 DIAGNOSIS — E1165 Type 2 diabetes mellitus with hyperglycemia: Principal | ICD-10-CM

## 2016-08-19 DIAGNOSIS — Z794 Long term (current) use of insulin: Secondary | ICD-10-CM

## 2016-08-19 DIAGNOSIS — IMO0001 Reserved for inherently not codable concepts without codable children: Secondary | ICD-10-CM

## 2016-08-19 DIAGNOSIS — E7800 Pure hypercholesterolemia, unspecified: Secondary | ICD-10-CM

## 2016-08-19 LAB — CBC WITH DIFFERENTIAL/PLATELET
Basophils Absolute: 0.1 10*3/uL (ref 0–0.1)
Basophils Relative: 1 %
EOS PCT: 3 %
Eosinophils Absolute: 0.2 10*3/uL (ref 0–0.7)
HCT: 26.8 % — ABNORMAL LOW (ref 35.0–47.0)
Hemoglobin: 9 g/dL — ABNORMAL LOW (ref 12.0–16.0)
LYMPHS ABS: 1.9 10*3/uL (ref 1.0–3.6)
LYMPHS PCT: 24 %
MCH: 29.6 pg (ref 26.0–34.0)
MCHC: 33.4 g/dL (ref 32.0–36.0)
MCV: 88.6 fL (ref 80.0–100.0)
MONO ABS: 0.6 10*3/uL (ref 0.2–0.9)
MONOS PCT: 8 %
Neutro Abs: 4.8 10*3/uL (ref 1.4–6.5)
Neutrophils Relative %: 64 %
PLATELETS: 245 10*3/uL (ref 150–440)
RBC: 3.03 MIL/uL — AB (ref 3.80–5.20)
RDW: 13.5 % (ref 11.5–14.5)
WBC: 7.6 10*3/uL (ref 3.6–11.0)

## 2016-08-19 LAB — GLUCOSE, CAPILLARY
GLUCOSE-CAPILLARY: 119 mg/dL — AB (ref 65–99)
Glucose-Capillary: 140 mg/dL — ABNORMAL HIGH (ref 65–99)
Glucose-Capillary: 134 mg/dL — ABNORMAL HIGH (ref 65–99)
Glucose-Capillary: 119 mg/dL — ABNORMAL HIGH (ref 65–99)

## 2016-08-19 LAB — LACTIC ACID, PLASMA: Lactic Acid, Venous: 1.1 mmol/L (ref 0.5–1.9)

## 2016-08-19 MED ORDER — LEVOFLOXACIN 500 MG PO TABS
750.0000 mg | ORAL_TABLET | Freq: Every day | ORAL | Status: DC
  Administered 2016-08-19 – 2016-08-21 (×3): 750 mg via ORAL
  Filled 2016-08-19 (×3): qty 2

## 2016-08-19 MED ORDER — LEVOFLOXACIN 750 MG PO TABS: 750.0000 mg | ORAL_TABLET | Freq: Every day | ORAL | 0 refills | Status: DC

## 2016-08-19 MED ORDER — IPRATROPIUM-ALBUTEROL 0.5-2.5 (3) MG/3ML IN SOLN: 3.0000 mL | Freq: Four times a day (QID) | RESPIRATORY_TRACT | Status: DC | PRN

## 2016-08-19 NOTE — Progress Notes (Signed)
[place] at [place] NAME: [name] [name]    MR#:  [id number]  DATE OF BIRTH:  Nov 13, 1960    CHIEF COMPLAINT:   Chief Complaint  Patient presents with  . Hyperglycemia   Laying in bed. Looks comfortable.  REVIEW OF SYSTEMS:   Review of Systems  Unable to perform ROS: Medical condition    DRUG ALLERGIES:   Allergies  Allergen Reactions  . Dilantin [Phenytoin] Other (See Comments)    unknown  . Penicillins Other (See Comments)    .Has patient had a PCN reaction causing immediate rash, facial/tongue/throat swelling, SOB or lightheadedness with hypotension: Unknown Has patient had a PCN reaction causing severe rash involving mucus membranes or skin necrosis: Unknown Has patient had a PCN reaction that required hospitalization: Unknown Has patient had a PCN reaction occurring within the last 10 years: Unknown If all of the above answers are "NO", then may proceed with Cephalosporin use.   Marland Kitchen Phenytoin Sodium Extended Other (See Comments)    Other reaction(s): Other (See Comments)  . Phenytoin Sodium Extended Other (See Comments)    Other reaction(s): Other (See Comments)  . Sulfa Antibiotics Rash and Other (See Comments)    unknown    VITALS:  Blood pressure 126/66, pulse (!) 104, temperature 98.2 F (36.8 C), temperature source Oral, resp. rate 20, height 5\' 6"  (1.676 m), weight 65.4 kg (144 lb 1.6 oz), SpO2 95 %.  PHYSICAL EXAMINATION:  GENERAL:  56 y.o.-year-old patient lying in the bed with no acute distress.  EYES: Pupils equal, round, reactive to light . No scleral icterus. Extraocular muscles intact.  HEENT: Head atraumatic, normocephalic. Oropharynx and nasopharynx clear.  NECK:  Supple, no jugular venous distention. No thyroid enlargement, no tenderness. No neck stiffness but LUNGS: Normal breath sounds bilaterally, no wheezing, rales,rhonchi or crepitation. No use of accessory muscles of respiration.   CARDIOVASCULAR: S1, S2 normal. No murmurs, rubs, or gallops.  ABDOMEN: Soft, nontender, nondistended. Bowel sounds present. No organomegaly or mass.  EXTREMITIES: No pedal edema, cyanosis, or clubbing.  NEUROLOGIC: Stiffness in the upper and lower extremities because of spastic quadriparesis PSYCHIATRIC: The patient is alert ,awake. SKIN: No obvious rash, lesion, or ulcer.   LABORATORY PANEL:   CBC  Recent Labs Lab 08/19/16 0824  WBC 7.6  HGB 9.0*  HCT 26.8*  PLT 245   ------------------------------------------------------------------------------------------------------------------  Chemistries   Recent Labs Lab 08/15/16 1700 08/16/16 0359 08/17/16 1750  NA  --  139  --   K  --  4.1  --   CL  --  109  --   CO2  --  24  --   GLUCOSE  --  124*  --   BUN  --  27*  --   CREATININE  --  0.84 0.72  CALCIUM  --  8.2*  --   MG 1.7  --   --   AST 23 20  --   ALT 20 17  --   ALKPHOS 96 64  --   BILITOT 0.6 0.6  --    ------------------------------------------------------------------------------------------------------------------  Cardiac Enzymes  Recent Labs Lab 08/15/16 1650  TROPONINI <0.03   ------------------------------------------------------------------------------------------------------------------  RADIOLOGY:  No results found.  EKG:   Orders placed or performed during the hospital encounter of 08/15/16  . ED EKG  . ED EKG  . EKG 12-Lead  . EKG 12-Lead    ASSESSMENT AND PLAN:   #1 Sepsis present on admission. Due to enterococcus  UTI. Initial consideration for meningitis and LP attempted in the emergency room but unsuccessful. Unlikely to be meningitis with no neck rigidity or change in neurological status. We will treat with Levaquin. Patient has penicillin allergy. Cultures negative. Lactic acid has normalized. Afebrile.  #2 history of traumatic brain injury.  #3 essential hypertension; improved. Discontinue IV fluids, encourage by mouth  intake.  #4 diabetes mellitus type 2: Continue Januvia, metformin.  #5 TBI with dementia: Continue fluoxetine, Pamelor, Namenda.  #6 deconditioning: Physical therapy recommended skilled nursing. Patient from group home.  #7 Anemia.  Discharge to SNF when bed PASSR available  CODE STATUS full code  All the records are reviewed and case discussed with Care Management/Social Worker Management plans discussed with the patient, family and they are in agreement.  CODE STATUS: full  TOTAL TIME TAKING CARE OF THIS PATIENT  35 minutes.   POSSIBLE D/C IN 1-2 DAYS, DEPENDING ON CLINICAL CONDITION.   Hillary Bow R M.D on 08/19/2016 at 12:26 PM  Between 7am to 6pm - Pager - [phone]  After 6pm go to [email or web address] - password [email or web address] [facility]  [place] [facility]  Office  [phone]  CC: Primary care physician; Roselee Nova, MD   Note: This dictation was prepared with Dragon dictation along with smaller phrase technology. Any transcriptional errors that result from this process are unintentional.

## 2016-08-19 NOTE — Discharge Instructions (Signed)
Dysphagia 1 diet  Activity with assistance

## 2016-08-19 NOTE — Progress Notes (Signed)
PHARMACIST - PHYSICIAN COMMUNICATION DR:   Darvin Neighbours CONCERNING: Antibiotic IV to Oral Route Change Policy  RECOMMENDATION: This patient is receiving Levaquin by the intravenous route.  Based on criteria approved by the Pharmacy and Therapeutics Committee, the antibiotic(s) is/are being converted to the equivalent oral dose form(s).   DESCRIPTION: These criteria include:  Patient being treated for a respiratory tract infection, urinary tract infection, cellulitis or clostridium difficile associated diarrhea if on metronidazole  The patient is not neutropenic and does not exhibit a GI malabsorption state  The patient is eating (either orally or via tube) and/or has been taking other orally administered medications for a least 24 hours  The patient is improving clinically and has a Tmax < 100.5  If you have questions about this conversion, please contact the Pharmacy Department  []   [phone] )  [facility] [x]   [phone] )  [facility] []   [phone] )  [facility] []   [phone] )  [facility] []   [phone] )  [place], PharmD Clinical Pharmacist

## 2016-08-19 NOTE — Consult Note (Signed)
   [facility] CM Inpatient Consult   08/19/2016  [name] [name] [birth year] [id number]  Patient screened for potential [place] Management services. Patient is on the [facility] registry as a benefit of their [employer] medicare . Electronic medical record reveals patient's discharge plan is SNF. [facility] Care Management services not appropriate at this time. If patient's post hospital needs change please place a [facility] Care Management consult. For questions please contact:   Elissa Grieshop RN, [facility] Liaison  [phone]) Business Mobile [phone]) Toll free office

## 2016-08-19 NOTE — Plan of Care (Signed)
Problem: Physical Regulation: Goal: Signs and symptoms of infection will decrease Outcome: Progressing Patient with TBI, need reinforcement, afebrile this shift. Alerted family

## 2016-08-19 NOTE — Progress Notes (Addendum)
Level 2 PASARR is pending. Patient can D/C to [facility] when medically stable and PASARR received. [facility] liaison is aware of above. Patient's guardian/ sister Linwood Dibbles is aware of above. Per [place] PASARR screener came out to [facility] and assessed patient on Tuesday 08/18/16. Clinical Social Worker (CSW) will continue to follow and assist as needed.   [employer], LCSW [phone]

## 2016-08-20 LAB — CULTURE, BLOOD (ROUTINE X 2)
CULTURE: NO GROWTH
Special Requests: ADEQUATE

## 2016-08-20 LAB — GLUCOSE, CAPILLARY
GLUCOSE-CAPILLARY: 105 mg/dL — AB (ref 65–99)
Glucose-Capillary: 152 mg/dL — ABNORMAL HIGH (ref 65–99)
Glucose-Capillary: 117 mg/dL — ABNORMAL HIGH (ref 65–99)
Glucose-Capillary: 104 mg/dL — ABNORMAL HIGH (ref 65–99)

## 2016-08-20 NOTE — Progress Notes (Signed)
[place] at [place] NAME: [name] [name]    MR#:  [id number]  DATE OF BIRTH:  12-06-1960    CHIEF COMPLAINT:   Chief Complaint  Patient presents with  . Hyperglycemia   Laying in bed. Looks comfortable. Patient has no concerns today  REVIEW OF SYSTEMS:   Review of Systems  Unable to perform ROS: Medical condition    DRUG ALLERGIES:   Allergies  Allergen Reactions  . Dilantin [Phenytoin] Other (See Comments)    unknown  . Penicillins Other (See Comments)    .Has patient had a PCN reaction causing immediate rash, facial/tongue/throat swelling, SOB or lightheadedness with hypotension: Unknown Has patient had a PCN reaction causing severe rash involving mucus membranes or skin necrosis: Unknown Has patient had a PCN reaction that required hospitalization: Unknown Has patient had a PCN reaction occurring within the last 10 years: Unknown If all of the above answers are "NO", then may proceed with Cephalosporin use.   Marland Kitchen Phenytoin Sodium Extended Other (See Comments)    Other reaction(s): Other (See Comments)  . Phenytoin Sodium Extended Other (See Comments)    Other reaction(s): Other (See Comments)  . Sulfa Antibiotics Rash and Other (See Comments)    unknown    VITALS:  Blood pressure 113/72, pulse (!) 105, temperature 98.2 F (36.8 C), temperature source Axillary, resp. rate 20, height 5\' 6"  (1.676 m), weight 67.1 kg (148 lb), SpO2 93 %.  PHYSICAL EXAMINATION:  GENERAL:  56 y.o.-year-old patient lying in the bed with no acute distress.  EYES: Pupils equal, round, reactive to light . No scleral icterus. Extraocular muscles intact.  HEENT: Head atraumatic, normocephalic. Oropharynx and nasopharynx clear.  NECK:  Supple, no jugular venous distention. No thyroid enlargement, no tenderness. No neck stiffness but LUNGS: Normal breath sounds bilaterally, no wheezing, rales,rhonchi or crepitation. No use of accessory muscles of  respiration.  CARDIOVASCULAR: S1, S2 normal. No murmurs, rubs, or gallops.  ABDOMEN: Soft, nontender, nondistended. Bowel sounds present. No organomegaly or mass.  EXTREMITIES: No pedal edema, cyanosis, or clubbing.  NEUROLOGIC: Stiffness in the upper and lower extremities because of spastic quadriparesis PSYCHIATRIC: The patient is alert ,awake. SKIN: No obvious rash, lesion, or ulcer.   LABORATORY PANEL:   CBC  Recent Labs Lab 08/19/16 0824  WBC 7.6  HGB 9.0*  HCT 26.8*  PLT 245   ------------------------------------------------------------------------------------------------------------------  Chemistries   Recent Labs Lab 08/15/16 1700 08/16/16 0359 08/17/16 1750  NA  --  139  --   K  --  4.1  --   CL  --  109  --   CO2  --  24  --   GLUCOSE  --  124*  --   BUN  --  27*  --   CREATININE  --  0.84 0.72  CALCIUM  --  8.2*  --   MG 1.7  --   --   AST 23 20  --   ALT 20 17  --   ALKPHOS 96 64  --   BILITOT 0.6 0.6  --    ------------------------------------------------------------------------------------------------------------------  Cardiac Enzymes  Recent Labs Lab 08/15/16 1650  TROPONINI <0.03   ------------------------------------------------------------------------------------------------------------------  RADIOLOGY:  No results found.  EKG:   Orders placed or performed during the hospital encounter of 08/15/16  . ED EKG  . ED EKG  . EKG 12-Lead  . EKG 12-Lead    ASSESSMENT AND PLAN:   #1 Sepsis present on admission.  Due to enterococcus UTI. Initial consideration for meningitis and LP attempted in the emergency room but unsuccessful. Unlikely to be meningitis with no neck rigidity or change in neurological status. We will treat with Levaquin. Patient has penicillin allergy. Cultures negative. Lactic acid has normalized. Afebrile.  #2 history of traumatic brain injury.  #3 essential hypertension; improved. Discontinue IV fluids,  encourage by mouth intake.  #4 diabetes mellitus type 2: Continue Januvia, metformin.  #5 TBI with dementia: Continue fluoxetine, Pamelor, Namenda.  #6 deconditioning: Physical therapy recommended skilled nursing. Patient from group home.  #7 Anemia.  # Constipation- laxatives ordered  Discharge to SNF when PASSR available  CODE STATUS full code  All the records are reviewed and case discussed with Care Management/Social Worker Management plans discussed with the patient, family and they are in agreement.  CODE STATUS: full  TOTAL TIME TAKING CARE OF THIS PATIENT  35 minutes.   POSSIBLE D/C IN 1-2 DAYS, DEPENDING ON CLINICAL CONDITION.   Hillary Bow R M.D on 08/20/2016 at 1:35 PM  Between 7am to 6pm - Pager - [phone]  After 6pm go to [email or web address] - password [email or web address] [facility]  [place] [facility]  Office  [phone]  CC: Primary care physician; Roselee Nova, MD   Note: This dictation was prepared with Dragon dictation along with smaller phrase technology. Any transcriptional errors that result from this process are unintentional.

## 2016-08-20 NOTE — Progress Notes (Signed)
Physical Therapy Treatment Patient Details Name: [name] [name] MRN: [id number] DOB: 1961-01-14 Today's Date: 08/20/2016    History of Present Illness Pt is a 56 y/o F who presented with weakness and anorexia.  Chest x-ray and UA negative.  Procalcitonin elevated consistent with sepsis.  Pt admitted for sepsis, possible meningitis.  Pt's PMH includes TBI, speech difficulties, osteoporosis, double vision, spastic quadriparesis.    PT Comments    Participated in exercises as described below.  To edge of bed with mod a x 1.  Balance deficits remain and requires min a x 1 to prevent LOB/fall while sitting unsupported.  Stood with mod a x 1 and was able to transfer to recliner at bedside with mod a x 1.  Pt was able to verbalize that she continued to feel weak and did not feel like she was back to her baseline.  Personal aid from group home with pt during session and observed mobility and transfer status.  While she stated she does provide some assist for transfer at home, she confirms she is requiring more assist than her baseline and is concerned about their ability to care for her given increased assistance needs.  SNF remains appropriate for pt at this time to increase her mobility and allow her to return and succeed in her group home setting.    Follow Up Recommendations  SNF     Equipment Recommendations  Other (comment)    Recommendations for Other Services       Precautions / Restrictions Precautions Precautions: Fall Restrictions Weight Bearing Restrictions: No    Mobility  Bed Mobility Overal bed mobility: Needs Assistance Bed Mobility: Supine to Sit     Supine to sit: Mod assist     General bed mobility comments: Unable to do without assist, personal aid from group home in and stated they help pt a little at home.  Transfers Overall transfer level: Needs assistance Equipment used: None Transfers: Sit to/from [facility] Sit to Stand: Mod  assist Stand pivot transfers: Mod assist       General transfer comment: Overall less assist today but continues to require increased assist than baseline per aid from group home.  Ambulation/Gait             General Gait Details: Pt non ambulatory.  Not safe to attempt at this time.   Stairs            Wheelchair Mobility    Modified Rankin (Stroke Patients Only)       Balance Overall balance assessment: Needs assistance Sitting-balance support: Feet supported Sitting balance-Leahy Scale: Poor Sitting balance - Comments: Pt relies on BUE support while demonstrating posterior and left lateral lean while seated EOB. Postural control: Posterior lean Standing balance support: Bilateral upper extremity supported Standing balance-Leahy Scale: Poor Standing balance comment: Pt did not use back of legs on bed today for support.  Unable to stand without assist due to balance and general weakness                            Cognition Arousal/Alertness: Awake/alert Behavior During Therapy: WFL for tasks assessed/performed Overall Cognitive Status: History of cognitive impairments - at baseline                                        Exercises General Exercises - Lower  Extremity Ankle Circles/Pumps: Both;10 reps;AROM;Supine Quad Sets: Strengthening;Both;10 reps;Supine Short Arc Quad: Strengthening;Both;10 reps;Supine Heel Slides: Strengthening;Both;10 reps;Supine Hip ABduction/ADduction: Strengthening;Both;10 reps;Supine Straight Leg Raises: Strengthening;Both;10 reps;Supine    General Comments        Pertinent Vitals/Pain Pain Assessment: No/denies pain    Home Living                      Prior Function            PT Goals (current goals can now be found in the care plan section) Progress towards PT goals: Progressing toward goals    Frequency    Min 2X/week      PT Plan Current plan remains appropriate     Co-evaluation              AM-PAC PT "6 Clicks" Daily Activity  Outcome Measure  Difficulty turning over in bed (including adjusting bedclothes, sheets and blankets)?: Total Difficulty moving from lying on back to sitting on the side of the bed? : Total Difficulty sitting down on and standing up from a chair with arms (e.g., wheelchair, bedside commode, etc,.)?: Total Help needed moving to and from a bed to chair (including a wheelchair)?: A Lot Help needed walking in hospital room?: Total Help needed climbing 3-5 steps with a railing? : Total 6 Click Score: 7    End of Session Equipment Utilized During Treatment: Gait belt Activity Tolerance: Patient tolerated treatment well Patient left: in chair;with chair alarm set;with call bell/phone within reach;with family/visitor present Nurse Communication: Mobility status       Time: 1610-9604 PT Time Calculation (min) (ACUTE ONLY): 16 min  Charges:  $Therapeutic Exercise: 8-22 mins                    G Codes:       Chesley Noon, PTA 08/20/16, 10:33 AM

## 2016-08-20 NOTE — Consult Note (Signed)
Pharmacy Antibiotic Note  [name] [name] is a 56 y.o. female admitted on 08/15/2016 with r/o sepsis and meningitis. Patient was found to have Enterococcal UTI. Pharmacy has been consulted for levofloxacin dosing.  Plan: Continue Levaquin PO 750 mg every 24 hours. Will f/u planned duration of therapy with MD.  Height: 5\' 6"  (167.6 cm) Weight: 148 lb (67.1 kg) IBW/kg (Calculated) : 59.3  Temp (24hrs), Avg:98.2 F (36.8 C), Min:98.2 F (36.8 C), Max:98.2 F (36.8 C)   Recent Labs Lab 08/15/16 1517 08/15/16 1635 08/15/16 1700 08/15/16 1912 08/16/16 0359 08/17/16 0402 08/17/16 1750 08/19/16 0824  WBC 27.1*  --  26.8*  --  12.3* 8.2  --  7.6  CREATININE 1.10*  --   --   --  0.84  --  0.72  --   LATICACIDVEN  --  2.7*  --  2.5*  --   --   --  1.1  VANCOTROUGH  --   --   --   --   --   --  15  --     Estimated Creatinine Clearance: 72.6 mL/min (by C-G formula based on SCr of 0.72 mg/dL).    Allergies  Allergen Reactions  . Dilantin [Phenytoin] Other (See Comments)    unknown  . Penicillins Other (See Comments)    .Has patient had a PCN reaction causing immediate rash, facial/tongue/throat swelling, SOB or lightheadedness with hypotension: Unknown Has patient had a PCN reaction causing severe rash involving mucus membranes or skin necrosis: Unknown Has patient had a PCN reaction that required hospitalization: Unknown Has patient had a PCN reaction occurring within the last 10 years: Unknown If all of the above answers are "NO", then may proceed with Cephalosporin use.   Marland Kitchen Phenytoin Sodium Extended Other (See Comments)    Other reaction(s): Other (See Comments)  . Phenytoin Sodium Extended Other (See Comments)    Other reaction(s): Other (See Comments)  . Sulfa Antibiotics Rash and Other (See Comments)    unknown    Antimicrobials this admission: Ceftriaxone 7/22 >> 7/24 levofloxacin 7/21 >>  aztreonam 7/21 >> 7/22 Vancomycin 7/21 >> 7/25  Dose adjustments this  admission:  Microbiology results: 7/21 BCx: staph species 1/2 7/21 UCx:  70K colonies enterococcus faecalis 7/22 MRSA PCR negative   Thank you for allowing pharmacy to be a part of this patient's care.  Ulice Dash D, PharmD Clinical Pharmacist 08/20/2016 8:28 AM

## 2016-08-20 NOTE — Progress Notes (Signed)
Level 2 PASARR is still pending.   [employer], LCSW [phone]

## 2016-08-20 NOTE — Plan of Care (Signed)
Problem: Physical Regulation: Goal: Diagnostic test results will improve Outcome: Progressing Lactic acid is normalizing

## 2016-08-21 DIAGNOSIS — I10 Essential (primary) hypertension: Secondary | ICD-10-CM | POA: Diagnosis not present

## 2016-08-21 DIAGNOSIS — K219 Gastro-esophageal reflux disease without esophagitis: Secondary | ICD-10-CM | POA: Diagnosis not present

## 2016-08-21 DIAGNOSIS — E119 Type 2 diabetes mellitus without complications: Secondary | ICD-10-CM | POA: Diagnosis not present

## 2016-08-21 DIAGNOSIS — M810 Age-related osteoporosis without current pathological fracture: Secondary | ICD-10-CM | POA: Diagnosis not present

## 2016-08-21 DIAGNOSIS — E118 Type 2 diabetes mellitus with unspecified complications: Secondary | ICD-10-CM | POA: Diagnosis not present

## 2016-08-21 DIAGNOSIS — G8220 Paraplegia, unspecified: Secondary | ICD-10-CM | POA: Diagnosis not present

## 2016-08-21 DIAGNOSIS — F329 Major depressive disorder, single episode, unspecified: Secondary | ICD-10-CM | POA: Diagnosis not present

## 2016-08-21 DIAGNOSIS — R1312 Dysphagia, oropharyngeal phase: Secondary | ICD-10-CM | POA: Diagnosis not present

## 2016-08-21 DIAGNOSIS — M6281 Muscle weakness (generalized): Secondary | ICD-10-CM | POA: Diagnosis not present

## 2016-08-21 DIAGNOSIS — E785 Hyperlipidemia, unspecified: Secondary | ICD-10-CM | POA: Diagnosis not present

## 2016-08-21 DIAGNOSIS — A419 Sepsis, unspecified organism: Secondary | ICD-10-CM | POA: Diagnosis not present

## 2016-08-21 DIAGNOSIS — F0390 Unspecified dementia without behavioral disturbance: Secondary | ICD-10-CM | POA: Diagnosis not present

## 2016-08-21 DIAGNOSIS — Z736 Limitation of activities due to disability: Secondary | ICD-10-CM | POA: Diagnosis not present

## 2016-08-21 DIAGNOSIS — Z7984 Long term (current) use of oral hypoglycemic drugs: Secondary | ICD-10-CM | POA: Diagnosis not present

## 2016-08-21 DIAGNOSIS — N329 Bladder disorder, unspecified: Secondary | ICD-10-CM | POA: Diagnosis not present

## 2016-08-21 DIAGNOSIS — Z87820 Personal history of traumatic brain injury: Secondary | ICD-10-CM | POA: Diagnosis not present

## 2016-08-21 DIAGNOSIS — F3289 Other specified depressive episodes: Secondary | ICD-10-CM | POA: Diagnosis not present

## 2016-08-21 DIAGNOSIS — R41841 Cognitive communication deficit: Secondary | ICD-10-CM | POA: Diagnosis not present

## 2016-08-21 DIAGNOSIS — N3281 Overactive bladder: Secondary | ICD-10-CM | POA: Diagnosis not present

## 2016-08-21 DIAGNOSIS — S06300S Unspecified focal traumatic brain injury without loss of consciousness, sequela: Secondary | ICD-10-CM | POA: Diagnosis not present

## 2016-08-21 DIAGNOSIS — Z5189 Encounter for other specified aftercare: Secondary | ICD-10-CM | POA: Diagnosis not present

## 2016-08-21 DIAGNOSIS — Z7401 Bed confinement status: Secondary | ICD-10-CM | POA: Diagnosis not present

## 2016-08-21 LAB — GLUCOSE, CAPILLARY
GLUCOSE-CAPILLARY: 119 mg/dL — AB (ref 65–99)
Glucose-Capillary: 155 mg/dL — ABNORMAL HIGH (ref 65–99)

## 2016-08-21 MED ORDER — METOPROLOL SUCCINATE ER 25 MG PO TB24
25.0000 mg | ORAL_TABLET | Freq: Every day | ORAL | Status: DC
  Administered 2016-08-21: 25 mg via ORAL
  Filled 2016-08-21: qty 1

## 2016-08-21 MED ORDER — METOPROLOL SUCCINATE ER 25 MG PO TB24: 25.0000 mg | ORAL_TABLET | Freq: Every day | ORAL | Status: DC

## 2016-08-21 MED ORDER — LEVOFLOXACIN 750 MG PO TABS: 750.0000 mg | ORAL_TABLET | Freq: Every day | ORAL | 0 refills | Status: AC

## 2016-08-21 NOTE — Consult Note (Signed)
Pharmacy Antibiotic Note  [name] [name] is a 56 y.o. female admitted on 08/15/2016 with r/o sepsis and meningitis. Patient was found to have Enterococcal UTI. Pharmacy has been consulted for levofloxacin dosing.  Plan: Continue Levaquin PO 750 mg every 24 hours. Will f/u planned duration of therapy with MD.  Height: 5\' 6"  (167.6 cm) Weight: 150 lb 14.4 oz (68.4 kg) IBW/kg (Calculated) : 59.3  Temp (24hrs), Avg:98.4 F (36.9 C), Min:98.4 F (36.9 C), Max:98.4 F (36.9 C)   Recent Labs Lab 08/15/16 1517 08/15/16 1635 08/15/16 1700 08/15/16 1912 08/16/16 0359 08/17/16 0402 08/17/16 1750 08/19/16 0824  WBC 27.1*  --  26.8*  --  12.3* 8.2  --  7.6  CREATININE 1.10*  --   --   --  0.84  --  0.72  --   LATICACIDVEN  --  2.7*  --  2.5*  --   --   --  1.1  VANCOTROUGH  --   --   --   --   --   --  15  --     Estimated Creatinine Clearance: 72.6 mL/min (by C-G formula based on SCr of 0.72 mg/dL).    Allergies  Allergen Reactions  . Dilantin [Phenytoin] Other (See Comments)    unknown  . Penicillins Other (See Comments)    .Has patient had a PCN reaction causing immediate rash, facial/tongue/throat swelling, SOB or lightheadedness with hypotension: Unknown Has patient had a PCN reaction causing severe rash involving mucus membranes or skin necrosis: Unknown Has patient had a PCN reaction that required hospitalization: Unknown Has patient had a PCN reaction occurring within the last 10 years: Unknown If all of the above answers are "NO", then may proceed with Cephalosporin use.   Marland Kitchen Phenytoin Sodium Extended Other (See Comments)    Other reaction(s): Other (See Comments)  . Phenytoin Sodium Extended Other (See Comments)    Other reaction(s): Other (See Comments)  . Sulfa Antibiotics Rash and Other (See Comments)    unknown    Antimicrobials this admission: Ceftriaxone 7/22 >> 7/24 levofloxacin 7/21 >>  aztreonam 7/21 >> 7/22 Vancomycin 7/21 >> 7/25  Dose adjustments this  admission:  Microbiology results: 7/21 BCx: staph species 1/2 7/21 UCx:  70K colonies enterococcus faecalis 7/22 MRSA PCR negative   Thank you for allowing pharmacy to be a part of this patient's care.  Ulice Dash D, PharmD Clinical Pharmacist 08/21/2016 7:26 AM

## 2016-08-21 NOTE — Care Management Important Message (Signed)
Important Message  Patient Details  Name: SMITH MCNICHOLAS MRN: [id number] Date of Birth: 1960/11/11   Medicare Important Message Given:  Yes    Jolly Mango, RN 08/21/2016, 8:22 AM

## 2016-08-21 NOTE — Progress Notes (Signed)
Patient is medically stable for D/C to [facility] today. PASARR has been received. Per [facility] liaison patient can come today to room 204. RN will call report to 200 hall nurse at [phone] and arrange EMS for transport. Clinical Education officer, museum (CSW) sent D/C orders to [facility] via HUB. CSW contacted patient's sister/ guardian Linwood Dibbles and made her aware of above. Please reconsult if future social work needs arise. CSW signing off.   [employer], LCSW [phone]

## 2016-08-21 NOTE — Progress Notes (Signed)
Called [facility] and sister [facility] regarding personal wc to have it picked up.

## 2016-08-21 NOTE — Clinical Social Work Placement (Signed)
   CLINICAL SOCIAL WORK PLACEMENT  NOTE  Date:  08/21/2016  Patient Details  Name: [name] [name] MRN: [id number] Date of Birth: 1960-01-30  Clinical Social Work is seeking post-discharge placement for this patient at the   level of care (*CSW will initial, date and re-position this form in  chart as items are completed):  Yes   Patient/family provided with [facility] Work Department's list of facilities offering this level of care within the geographic area requested by the patient (or if unable, by the patient's family).  Yes   Patient/family informed of their freedom to choose among providers that offer the needed level of care, that participate in Medicare, Medicaid or managed care program needed by the patient, have an available bed and are willing to accept the patient.  Yes   Patient/family informed of [facility]'s ownership interest in [facility] and [facility], as well as of the fact that they are under no obligation to receive care at these facilities.  PASRR submitted to EDS on 08/16/16     PASRR number received on 08/21/16     Existing PASRR number confirmed on       FL2 transmitted to all facilities in geographic area requested by pt/family on 08/16/16     FL2 transmitted to all facilities within larger geographic area on       Patient informed that his/her managed care company has contracts with or will negotiate with certain facilities, including the following:        Yes   Patient/family informed of bed offers received.  Patient chooses bed at  ([facility] )     Physician recommends and patient chooses bed at      Patient to be transferred to  ([facility] ) on 08/21/16.  Patient to be transferred to facility by  [facility] )     Patient family notified on 08/21/16 of transfer.  Name of family member notified:   (Patinet's sister/ guardian Synetta Fail is aware of D/C today. )     PHYSICIAN       Additional Comment:     _______________________________________________ Lynnix Schoneman, Veronia Beets, LCSW 08/21/2016, 11:30 AM

## 2016-08-25 DIAGNOSIS — F329 Major depressive disorder, single episode, unspecified: Secondary | ICD-10-CM | POA: Diagnosis not present

## 2016-08-25 DIAGNOSIS — M6281 Muscle weakness (generalized): Secondary | ICD-10-CM | POA: Diagnosis not present

## 2016-08-25 DIAGNOSIS — G8220 Paraplegia, unspecified: Secondary | ICD-10-CM | POA: Diagnosis not present

## 2016-08-25 DIAGNOSIS — Z7984 Long term (current) use of oral hypoglycemic drugs: Secondary | ICD-10-CM | POA: Diagnosis not present

## 2016-08-25 DIAGNOSIS — I10 Essential (primary) hypertension: Secondary | ICD-10-CM | POA: Diagnosis not present

## 2016-08-25 DIAGNOSIS — E119 Type 2 diabetes mellitus without complications: Secondary | ICD-10-CM | POA: Diagnosis not present

## 2016-08-25 DIAGNOSIS — N3281 Overactive bladder: Secondary | ICD-10-CM | POA: Diagnosis not present

## 2016-08-25 DIAGNOSIS — E785 Hyperlipidemia, unspecified: Secondary | ICD-10-CM | POA: Diagnosis not present

## 2016-08-25 DIAGNOSIS — F0390 Unspecified dementia without behavioral disturbance: Secondary | ICD-10-CM | POA: Diagnosis not present

## 2016-08-25 DIAGNOSIS — Z87820 Personal history of traumatic brain injury: Secondary | ICD-10-CM | POA: Diagnosis not present

## 2016-08-26 ENCOUNTER — Ambulatory Visit: Payer: Medicare Other | Admitting: Family Medicine

## 2016-08-31 ENCOUNTER — Other Ambulatory Visit: Payer: Self-pay | Admitting: Family Medicine

## 2016-08-31 DIAGNOSIS — Z794 Long term (current) use of insulin: Secondary | ICD-10-CM

## 2016-08-31 DIAGNOSIS — E1121 Type 2 diabetes mellitus with diabetic nephropathy: Secondary | ICD-10-CM

## 2016-08-31 DIAGNOSIS — E1165 Type 2 diabetes mellitus with hyperglycemia: Secondary | ICD-10-CM

## 2016-08-31 DIAGNOSIS — E7800 Pure hypercholesterolemia, unspecified: Secondary | ICD-10-CM

## 2016-08-31 DIAGNOSIS — IMO0001 Reserved for inherently not codable concepts without codable children: Secondary | ICD-10-CM

## 2016-09-01 ENCOUNTER — Telehealth: Payer: Self-pay

## 2016-09-01 MED ORDER — OMEPRAZOLE 20 MG PO CPDR: 20.0000 mg | DELAYED_RELEASE_CAPSULE | Freq: Every day | ORAL | 0 refills | Status: DC

## 2016-09-01 NOTE — Telephone Encounter (Signed)
Patient did not keep her hospital follow-up appointment with Dr. Manuella Ghazi on August 1st She was hospitalized with sepsis She needs to see him for hospital f/u I will approve very limited Rxs for some meds, but she'll need to see him and get further refills

## 2016-09-01 NOTE — Telephone Encounter (Signed)
LMOM to inform pt °

## 2016-09-01 NOTE — Telephone Encounter (Signed)
Medication has been refilled and sent to [facility]

## 2016-09-02 ENCOUNTER — Other Ambulatory Visit: Payer: Self-pay | Admitting: *Deleted

## 2016-09-02 DIAGNOSIS — Z7984 Long term (current) use of oral hypoglycemic drugs: Secondary | ICD-10-CM | POA: Diagnosis not present

## 2016-09-02 DIAGNOSIS — N3281 Overactive bladder: Secondary | ICD-10-CM | POA: Diagnosis not present

## 2016-09-02 DIAGNOSIS — G8220 Paraplegia, unspecified: Secondary | ICD-10-CM | POA: Diagnosis not present

## 2016-09-02 DIAGNOSIS — E119 Type 2 diabetes mellitus without complications: Secondary | ICD-10-CM | POA: Diagnosis not present

## 2016-09-02 DIAGNOSIS — E785 Hyperlipidemia, unspecified: Secondary | ICD-10-CM | POA: Diagnosis not present

## 2016-09-02 DIAGNOSIS — I10 Essential (primary) hypertension: Secondary | ICD-10-CM | POA: Diagnosis not present

## 2016-09-02 DIAGNOSIS — F0390 Unspecified dementia without behavioral disturbance: Secondary | ICD-10-CM | POA: Diagnosis not present

## 2016-09-02 NOTE — Patient Outreach (Signed)
[facility]) Care Management  09/02/2016  [name] [name] Mar 15, 1960 [id number]   Met with [name], SW he reports patient is from a group home with plans to discharge back. Plan to sign off as no [name] community care management needs assessed. Royetta Crochet. Laymond Purser, RN, BSN, [place] [phone]) Business Cell  [phone]) Toll Free Office

## 2016-09-11 DIAGNOSIS — R2689 Other abnormalities of gait and mobility: Secondary | ICD-10-CM | POA: Diagnosis not present

## 2016-09-11 DIAGNOSIS — M6281 Muscle weakness (generalized): Secondary | ICD-10-CM | POA: Diagnosis not present

## 2016-09-15 DIAGNOSIS — R2689 Other abnormalities of gait and mobility: Secondary | ICD-10-CM | POA: Diagnosis not present

## 2016-09-15 DIAGNOSIS — M6281 Muscle weakness (generalized): Secondary | ICD-10-CM | POA: Diagnosis not present

## 2016-09-16 DIAGNOSIS — R2689 Other abnormalities of gait and mobility: Secondary | ICD-10-CM | POA: Diagnosis not present

## 2016-09-16 DIAGNOSIS — M6281 Muscle weakness (generalized): Secondary | ICD-10-CM | POA: Diagnosis not present

## 2016-09-17 ENCOUNTER — Ambulatory Visit (INDEPENDENT_AMBULATORY_CARE_PROVIDER_SITE_OTHER): Payer: Medicare Other | Admitting: Family Medicine

## 2016-09-17 ENCOUNTER — Encounter: Payer: Self-pay | Admitting: Family Medicine

## 2016-09-17 VITALS — BP 110/70 | HR 80 | Temp 97.5°F | Resp 16

## 2016-09-17 DIAGNOSIS — N390 Urinary tract infection, site not specified: Secondary | ICD-10-CM | POA: Diagnosis not present

## 2016-09-17 DIAGNOSIS — A419 Sepsis, unspecified organism: Secondary | ICD-10-CM | POA: Diagnosis not present

## 2016-09-17 DIAGNOSIS — I10 Essential (primary) hypertension: Secondary | ICD-10-CM | POA: Diagnosis not present

## 2016-09-17 NOTE — Progress Notes (Signed)
Name: MARIJAYNE RAUTH   MRN: [id number]    DOB: Feb 04, 1960   Date:09/17/2016       Progress Note  Subjective  Chief Complaint  Chief Complaint  Patient presents with  . Hospitalization Follow-up    unknown infection   . Immunizations    PPSV 23    HPI  Pt. Presents for follow up from the hospital where she was admitted as a result of sepsis secondary to urinary source. She had enterococcus UTI which was treated with Levaquin. She is back to baseline, reports no fevers, chills, or problems with urination.  Additionally, she had her antihypertensive medication switched from Ramipril to Toprol XL. Blood pressure is at goal.     Past Medical History:  Diagnosis Date  . Arthritis   . Borderline intellectual functioning   . Diabetes mellitus without complication ([place])   . Double vision   . Hypertension   . Osteoporosis   . 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[place])   . 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 Traumatic brain injury [facility])     History reviewed. No pertinent surgical history.  History reviewed. No pertinent family history.  Social History   Social History  . Marital status: Single    Spouse name: N/A  . Number of children: N/A  . Years of education: N/A   Occupational History  . Not on file.   Social History Main Topics  . Smoking status: Former Research scientist (life sciences)  . Smokeless tobacco: Never Used  . Alcohol use No  . Drug use: No  . Sexual activity: Not Currently   Other Topics Concern  . Not on file   Social History Narrative  . No narrative on file     Current Outpatient Prescriptions:  .  ACCU-CHEK AVIVA PLUS test strip, CHECK BLOOD SUGAR TWICE A WEEK, Disp: 100 each, Rfl: PRN .  Alcohol Swabs (B-D SINGLE USE SWABS REGULAR) PADS, USE AS DIRECTED (CHECK BLOOD SUGAR TWICE A WEEK), Disp: 100 each, Rfl: PRN .  alendronate (FOSAMAX) 70 MG tablet, 1 TAB PO WEEKLY EARLY AM BEFORE FOOD/MEDS W/WATER. DON'T LIE DOWN FOR30 MIN (OSTEOPOROSIS) *NO CRUSH*, Disp: 1 tablet, Rfl: 0 .   calcium-vitamin D (OSCAL WITH D) 500-200 MG-UNIT tablet, Take 1 tablet by mouth 2 (two) times daily., Disp: , Rfl:  .  cholecalciferol (VITAMIN D) 400 units TABS tablet, TAKE ONE TABLET BY MOUTH 2 TIMES A DAY, Disp: 60 tablet, Rfl: 11 .  cilostazol (PLETAL) 50 MG tablet, TAKE ONE TABLET BY MOUTH TWO TIMES A DAY, Disp: 14 tablet, Rfl: 0 .  desmopressin (DDAVP) 0.2 MG tablet, TAKE 1 TABLET BY MOUTH AT BEDTIME., Disp: 4 tablet, Rfl: 0 .  donepezil (ARICEPT) 10 MG tablet, Take 1 tablet (10 mg total) by mouth at bedtime., Disp: 7 tablet, Rfl: 0 .  ferrous sulfate 325 (65 FE) MG tablet, Take 325 mg by mouth daily., Disp: , Rfl:  .  FLUoxetine (PROZAC) 10 MG tablet, Take 30 mg by mouth every morning., Disp: , Rfl:  .  JANUVIA 100 MG tablet, ONE TABLET BY MOUTH ONCE A DAY. (DIABETES), Disp: 7 tablet, Rfl: 0 .  ketoconazole (NIZORAL) 2 % cream, APPLY TOPICALLY ONCE DAILY, Disp: 15 g, Rfl: 0 .  Lurasidone HCl 20 MG TABS, Take 1 tablet by mouth every morning., Disp: , Rfl:  .  memantine (NAMENDA XR) 28 MG CP24 24 hr capsule, Take 28 mg by mouth daily., Disp: , Rfl:  .  metFORMIN (GLUCOPHAGE) 500 MG tablet, TAKE ONE TABLET  BY MOUTH EVERY DAY WITH BREAKFAST, Disp: 30 tablet, Rfl: 0 .  metoprolol succinate (TOPROL-XL) 25 MG 24 hr tablet, Take 1 tablet (25 mg total) by mouth daily., Disp: , Rfl:  .  nortriptyline (PAMELOR) 25 MG capsule, Take 1 capsule (25 mg total) by mouth at bedtime., Disp: 4 capsule, Rfl: 0 .  nystatin cream (MYCOSTATIN), Apply topically as needed for dry skin., Disp: 120 g, Rfl: 0 .  omeprazole (PRILOSEC) 20 MG capsule, Take 1 capsule (20 mg total) by mouth daily., Disp: 90 capsule, Rfl: 0 .  polyethylene glycol powder (GLYCOLAX/MIRALAX) powder, Take 255 g by mouth once., Disp: 3350 g, Rfl: 1 .  Probiotic Product (PROBIOTIC-10) CAPS, Take 1 capsule by mouth daily., Disp: 30 capsule, Rfl: 0 .  simvastatin (ZOCOR) 10 MG tablet, TAKE ONE TABLET BY MOUTH AT BEDTIME., Disp: 7 tablet, Rfl: 0 .   STOOL SOFTENER 100 MG capsule, TAKE 1 CAPSULE BY MOUTH TWICE A DAY. (STOOL SOFTNER) (TAKE WITH GLASSWATER), Disp: 60 capsule, Rfl: 4 .  Tea Tree 100 % OIL, Apply 1 application topically daily. To nails, Disp: 60 mL, Rfl: 3 .  tolterodine (DETROL LA) 4 MG 24 hr capsule, TAKE 1 CAPSULE BY MOUTH ONCE DAILY FOR BLADDER. **DO NOT CRUSH**, Disp: 7 capsule, Rfl: 0 .  ramipril (ALTACE) 10 MG capsule, TAKE 1 CAPSULE BY MOUTH EVERY DAY (Patient not taking: Reported on 09/17/2016), Disp: 7 capsule, Rfl: 0  Allergies  Allergen Reactions  . Dilantin [Phenytoin] Other (See Comments)    unknown  . Penicillins Other (See Comments)    .Has patient had a PCN reaction causing immediate rash, facial/tongue/throat swelling, SOB or lightheadedness with hypotension: Unknown Has patient had a PCN reaction causing severe rash involving mucus membranes or skin necrosis: Unknown Has patient had a PCN reaction that required hospitalization: Unknown Has patient had a PCN reaction occurring within the last 10 years: Unknown If all of the above answers are "NO", then may proceed with Cephalosporin use.   Marland Kitchen Phenytoin Sodium Extended Other (See Comments)    Other reaction(s): Other (See Comments)  . Phenytoin Sodium Extended Other (See Comments)    Other reaction(s): Other (See Comments)  . Sulfa Antibiotics Rash and Other (See Comments)    unknown     ROS  Please see history of present illness for complete discussion of ROS  Objective  Vitals:   09/17/16 1457  BP: 110/70  Pulse: 80  Resp: 16  Temp: (!) 97.5 F (36.4 C)  TempSrc: Oral  SpO2: 93%    Physical Exam  Constitutional: She is well-developed, well-nourished, and in no distress.  HENT:  Head: Normocephalic and atraumatic.  Cardiovascular: Normal rate, regular rhythm and normal heart sounds.   No murmur heard. Pulmonary/Chest: Effort normal and breath sounds normal. She has no wheezes.  Abdominal: Soft. Bowel sounds are normal.  Neurological:  She is alert.  Nursing note and vitals reviewed.    Assessment & Plan  1. Sepsis secondary to UTI [facility]) Now resolved, finished antimicrobial course, obtain liver and kidney function - COMPLETE METABOLIC PANEL WITH GFR  2. Essential hypertension Changed to metoprolol, continue on present treatment, blood pressure at goal - COMPLETE METABOLIC PANEL WITH GFR   Meital Riehl Asad [facility] 09/17/2016 3:12 PM

## 2016-09-18 ENCOUNTER — Telehealth: Payer: Self-pay

## 2016-09-18 DIAGNOSIS — M6281 Muscle weakness (generalized): Secondary | ICD-10-CM | POA: Diagnosis not present

## 2016-09-18 DIAGNOSIS — R2689 Other abnormalities of gait and mobility: Secondary | ICD-10-CM | POA: Diagnosis not present

## 2016-09-18 LAB — COMPLETE METABOLIC PANEL WITH GFR
ALT: 8 U/L (ref 6–29)
AST: 12 U/L (ref 10–35)
Albumin: 3.8 g/dL (ref 3.6–5.1)
Alkaline Phosphatase: 74 U/L (ref 33–130)
BILIRUBIN TOTAL: 0.2 mg/dL (ref 0.2–1.2)
BUN: 7 mg/dL (ref 7–25)
CO2: 26 mmol/L (ref 20–32)
CREATININE: 0.75 mg/dL (ref 0.50–1.05)
Calcium: 9.8 mg/dL (ref 8.6–10.4)
Chloride: 98 mmol/L (ref 98–110)
GFR, Est African American: >89 mL/min (ref >=60)
GFR, Est Non African American: 89 mL/min (ref >=60)
Glucose, Bld: 83 mg/dL (ref 65–99)
Potassium: 4.3 mmol/L (ref 3.5–5.3)
SODIUM: 137 mmol/L (ref 135–146)
TOTAL PROTEIN: 6.3 g/dL (ref 6.1–8.1)

## 2016-09-18 NOTE — Telephone Encounter (Signed)
Spoke with the rep for the home ( [employer]) she is in and she states she don't know why she needs diapers it should be a over flow of it. Went over the forms that Dr. Manuella Ghazi signed and saw that it has 12 months supply therefore she shouldn't need anymore until next year 05/13/2017.  She state she will speak to [place] and she if she just need a refill.

## 2016-09-18 NOTE — Telephone Encounter (Signed)
I have already completed durable medical equipment paperwork for her in April 2018 authorizing the use of diaper pads. If she needs more pads, please have the company send [place] additional paperwork to be completed

## 2016-09-18 NOTE — Telephone Encounter (Signed)
Pritika caregiver asking if you can write a RX for diapers for her. She states she use them at night .

## 2016-09-22 DIAGNOSIS — M6281 Muscle weakness (generalized): Secondary | ICD-10-CM | POA: Diagnosis not present

## 2016-09-22 DIAGNOSIS — R2689 Other abnormalities of gait and mobility: Secondary | ICD-10-CM | POA: Diagnosis not present

## 2016-09-23 DIAGNOSIS — R2689 Other abnormalities of gait and mobility: Secondary | ICD-10-CM | POA: Diagnosis not present

## 2016-09-23 DIAGNOSIS — M6281 Muscle weakness (generalized): Secondary | ICD-10-CM | POA: Diagnosis not present

## 2016-09-24 DIAGNOSIS — R2689 Other abnormalities of gait and mobility: Secondary | ICD-10-CM | POA: Diagnosis not present

## 2016-09-24 DIAGNOSIS — M6281 Muscle weakness (generalized): Secondary | ICD-10-CM | POA: Diagnosis not present

## 2016-09-30 DIAGNOSIS — R2689 Other abnormalities of gait and mobility: Secondary | ICD-10-CM | POA: Diagnosis not present

## 2016-09-30 DIAGNOSIS — M6281 Muscle weakness (generalized): Secondary | ICD-10-CM | POA: Diagnosis not present

## 2016-10-02 DIAGNOSIS — R2689 Other abnormalities of gait and mobility: Secondary | ICD-10-CM | POA: Diagnosis not present

## 2016-10-02 DIAGNOSIS — M6281 Muscle weakness (generalized): Secondary | ICD-10-CM | POA: Diagnosis not present

## 2016-10-07 DIAGNOSIS — R2689 Other abnormalities of gait and mobility: Secondary | ICD-10-CM | POA: Diagnosis not present

## 2016-10-07 DIAGNOSIS — M6281 Muscle weakness (generalized): Secondary | ICD-10-CM | POA: Diagnosis not present

## 2016-10-09 DIAGNOSIS — M6281 Muscle weakness (generalized): Secondary | ICD-10-CM | POA: Diagnosis not present

## 2016-10-09 DIAGNOSIS — R2689 Other abnormalities of gait and mobility: Secondary | ICD-10-CM | POA: Diagnosis not present

## 2016-10-14 DIAGNOSIS — M6281 Muscle weakness (generalized): Secondary | ICD-10-CM | POA: Diagnosis not present

## 2016-10-14 DIAGNOSIS — R2689 Other abnormalities of gait and mobility: Secondary | ICD-10-CM | POA: Diagnosis not present

## 2016-10-19 ENCOUNTER — Encounter: Payer: Self-pay | Admitting: Family Medicine

## 2016-10-19 ENCOUNTER — Ambulatory Visit (INDEPENDENT_AMBULATORY_CARE_PROVIDER_SITE_OTHER): Payer: Medicare Other | Admitting: Family Medicine

## 2016-10-19 VITALS — BP 118/68 | HR 68 | Temp 98.7°F | Resp 16 | Ht 66.0 in | Wt 153.0 lb

## 2016-10-19 DIAGNOSIS — E7800 Pure hypercholesterolemia, unspecified: Secondary | ICD-10-CM | POA: Diagnosis not present

## 2016-10-19 DIAGNOSIS — B351 Tinea unguium: Secondary | ICD-10-CM | POA: Diagnosis not present

## 2016-10-19 DIAGNOSIS — E119 Type 2 diabetes mellitus without complications: Secondary | ICD-10-CM | POA: Diagnosis not present

## 2016-10-19 DIAGNOSIS — Z23 Encounter for immunization: Secondary | ICD-10-CM | POA: Diagnosis not present

## 2016-10-19 DIAGNOSIS — R0989 Other specified symptoms and signs involving the circulatory and respiratory systems: Secondary | ICD-10-CM

## 2016-10-19 LAB — COMPLETE METABOLIC PANEL WITH GFR
AG Ratio: 1.1 (calc) (ref 1.0–2.5)
ALT: 10 U/L (ref 6–29)
AST: 14 U/L (ref 10–35)
Albumin: 3.6 g/dL (ref 3.6–5.1)
Alkaline phosphatase (APISO): 105 U/L (ref 33–130)
BUN: 10 mg/dL (ref 7–25)
CALCIUM: 9.8 mg/dL (ref 8.6–10.4)
CO2: 33 mmol/L — AB (ref 20–32)
CREATININE: 0.88 mg/dL (ref 0.50–1.05)
Chloride: 97 mmol/L — ABNORMAL LOW (ref 98–110)
GFR, EST NON AFRICAN AMERICAN: 73 mL/min/{1.73_m2} (ref >=60)
GFR, Est African American: 85 mL/min/{1.73_m2} (ref >=60)
GLUCOSE: 108 mg/dL — AB (ref 65–99)
Globulin: 3.2 g/dL (calc) (ref 1.9–3.7)
POTASSIUM: 5.1 mmol/L (ref 3.5–5.3)
Sodium: 137 mmol/L (ref 135–146)
TOTAL PROTEIN: 6.8 g/dL (ref 6.1–8.1)
Total Bilirubin: 0.2 mg/dL (ref 0.2–1.2)

## 2016-10-19 LAB — LIPID PANEL
Cholesterol: 152 mg/dL (ref <200)
HDL: 73 mg/dL (ref >50)
LDL Cholesterol (Calc): 62 mg/dL (calc)
NON-HDL CHOLESTEROL (CALC): 79 mg/dL (ref <130)
Total CHOL/HDL Ratio: 2.1 (calc) (ref <5.0)
Triglycerides: 91 mg/dL (ref <150)

## 2016-10-19 LAB — POCT GLYCOSYLATED HEMOGLOBIN (HGB A1C): Hemoglobin A1C: 5.8

## 2016-10-19 MED ORDER — SIMVASTATIN 10 MG PO TABS: 10.0000 mg | ORAL_TABLET | Freq: Every day | ORAL | 0 refills | Status: DC

## 2016-10-19 MED ORDER — METFORMIN HCL 500 MG PO TABS
ORAL_TABLET | ORAL | 0 refills | Status: DC
Start: 2016-10-19 — End: 2017-02-01

## 2016-10-19 MED ORDER — SITAGLIPTIN PHOSPHATE 100 MG PO TABS: ORAL_TABLET | ORAL | 0 refills | Status: DC

## 2016-10-19 NOTE — Progress Notes (Signed)
Name: [name] [name]   MRN: [id number]    DOB: [birth year]   Date:10/19/2016       Progress Note  Subjective  Chief Complaint  Chief Complaint  Patient presents with  . sepsis    1 month follow up    Diabetes  She presents for her follow-up diabetic visit. She has type 2 diabetes mellitus. Her disease course has been stable. Pertinent negatives for hypoglycemia include no dizziness, headaches or nervousness/anxiousness. Pertinent negatives for diabetes include no fatigue, no foot paresthesias, no polydipsia and no polyuria. Symptoms are stable. Pertinent negatives for diabetic complications include no CVA, heart disease or peripheral neuropathy. Current diabetic treatment includes oral agent (monotherapy). Her weight is stable. She is following a generally healthy diet. She monitors blood glucose at home 1-2 x per week. Her breakfast blood glucose range is generally 90-110 mg/dl. An ACE inhibitor/angiotensin II receptor blocker is being taken.  Hyperlipidemia  This is a chronic problem. The problem is controlled. Recent lipid tests were reviewed and are normal. Pertinent negatives include no leg pain, myalgias or shortness of breath. Current antihyperlipidemic treatment includes statins.     Past Medical History:  Diagnosis Date  . Arthritis   . Borderline intellectual functioning   . Diabetes mellitus without complication ([place])   . Double vision   . Hypertension   . Osteoporosis   . 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[place])   . 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 Traumatic brain injury ([place])     No past surgical history on file.  No family history on file.  Social History   Social History  . Marital status: Single    Spouse name: N/A  . Number of children: N/A  . Years of education: N/A   Occupational History  . Not on file.   Social History Main Topics  . Smoking status: Former Research scientist (life sciences)  . Smokeless tobacco: Never Used  . Alcohol use No  . Drug use: No  . Sexual activity: Not  Currently   Other Topics Concern  . Not on file   Social History Narrative  . No narrative on file     Current Outpatient Prescriptions:  .  ACCU-CHEK AVIVA PLUS test strip, CHECK BLOOD SUGAR TWICE A WEEK, Disp: 100 each, Rfl: PRN .  Alcohol Swabs (B-D SINGLE USE SWABS REGULAR) PADS, USE AS DIRECTED (CHECK BLOOD SUGAR TWICE A WEEK), Disp: 100 each, Rfl: PRN .  alendronate (FOSAMAX) 70 MG tablet, 1 TAB PO WEEKLY EARLY AM BEFORE FOOD/MEDS W/WATER. DON'T LIE DOWN FOR30 MIN (OSTEOPOROSIS) *NO CRUSH*, Disp: 1 tablet, Rfl: 0 .  calcium-vitamin D (OSCAL WITH D) 500-200 MG-UNIT tablet, Take 1 tablet by mouth 2 (two) times daily., Disp: , Rfl:  .  cholecalciferol (VITAMIN D) 400 units TABS tablet, TAKE ONE TABLET BY MOUTH 2 TIMES A DAY, Disp: 60 tablet, Rfl: 11 .  cilostazol (PLETAL) 50 MG tablet, TAKE ONE TABLET BY MOUTH TWO TIMES A DAY, Disp: 14 tablet, Rfl: 0 .  desmopressin (DDAVP) 0.2 MG tablet, TAKE 1 TABLET BY MOUTH AT BEDTIME., Disp: 4 tablet, Rfl: 0 .  donepezil (ARICEPT) 10 MG tablet, Take 1 tablet (10 mg total) by mouth at bedtime., Disp: 7 tablet, Rfl: 0 .  ferrous sulfate 325 (65 FE) MG tablet, Take 325 mg by mouth daily., Disp: , Rfl:  .  FLUoxetine (PROZAC) 10 MG tablet, Take 30 mg by mouth every morning., Disp: , Rfl:  .  JANUVIA 100 MG tablet, ONE TABLET BY MOUTH ONCE A  DAY. (DIABETES), Disp: 7 tablet, Rfl: 0 .  ketoconazole (NIZORAL) 2 % cream, APPLY TOPICALLY ONCE DAILY, Disp: 15 g, Rfl: 0 .  Lurasidone HCl 20 MG TABS, Take 1 tablet by mouth every morning., Disp: , Rfl:  .  memantine (NAMENDA XR) 28 MG CP24 24 hr capsule, Take 28 mg by mouth daily., Disp: , Rfl:  .  metFORMIN (GLUCOPHAGE) 500 MG tablet, TAKE ONE TABLET BY MOUTH EVERY DAY WITH BREAKFAST, Disp: 30 tablet, Rfl: 0 .  metoprolol succinate (TOPROL-XL) 25 MG 24 hr tablet, Take 1 tablet (25 mg total) by mouth daily., Disp: , Rfl:  .  nortriptyline (PAMELOR) 25 MG capsule, Take 1 capsule (25 mg total) by mouth at  bedtime., Disp: 4 capsule, Rfl: 0 .  nystatin cream (MYCOSTATIN), Apply topically as needed for dry skin., Disp: 120 g, Rfl: 0 .  omeprazole (PRILOSEC) 20 MG capsule, Take 1 capsule (20 mg total) by mouth daily., Disp: 90 capsule, Rfl: 0 .  polyethylene glycol powder (GLYCOLAX/MIRALAX) powder, Take 255 g by mouth once., Disp: 3350 g, Rfl: 1 .  Probiotic Product (PROBIOTIC-10) CAPS, Take 1 capsule by mouth daily., Disp: 30 capsule, Rfl: 0 .  ramipril (ALTACE) 10 MG capsule, TAKE 1 CAPSULE BY MOUTH EVERY DAY, Disp: 7 capsule, Rfl: 0 .  simvastatin (ZOCOR) 10 MG tablet, TAKE ONE TABLET BY MOUTH AT BEDTIME., Disp: 7 tablet, Rfl: 0 .  STOOL SOFTENER 100 MG capsule, TAKE 1 CAPSULE BY MOUTH TWICE A DAY. (STOOL SOFTNER) (TAKE WITH GLASSWATER), Disp: 60 capsule, Rfl: 4 .  Tea Tree 100 % OIL, Apply 1 application topically daily. To nails, Disp: 60 mL, Rfl: 3 .  tolterodine (DETROL LA) 4 MG 24 hr capsule, TAKE 1 CAPSULE BY MOUTH ONCE DAILY FOR BLADDER. **DO NOT CRUSH**, Disp: 7 capsule, Rfl: 0  Allergies  Allergen Reactions  . Dilantin [Phenytoin] Other (See Comments)    unknown  . Penicillins Other (See Comments)    .Has patient had a PCN reaction causing immediate rash, facial/tongue/throat swelling, SOB or lightheadedness with hypotension: Unknown Has patient had a PCN reaction causing severe rash involving mucus membranes or skin necrosis: Unknown Has patient had a PCN reaction that required hospitalization: Unknown Has patient had a PCN reaction occurring within the last 10 years: Unknown If all of the above answers are "NO", then may proceed with Cephalosporin use.   Marland Kitchen Phenytoin Sodium Extended Other (See Comments)    Other reaction(s): Other (See Comments)  . Phenytoin Sodium Extended Other (See Comments)    Other reaction(s): Other (See Comments)  . Sulfa Antibiotics Rash and Other (See Comments)    unknown     Review of Systems  Constitutional: Negative for fatigue.  Respiratory:  Negative for shortness of breath.   Musculoskeletal: Negative for myalgias.  Neurological: Negative for dizziness and headaches.  Endo/Heme/Allergies: Negative for polydipsia.  Psychiatric/Behavioral: The patient is not nervous/anxious.       Objective  Vitals:   10/19/16 0928  BP: 118/68  Pulse: 68  Resp: 16  Temp: 98.7 F (37.1 C)  SpO2: 98%  Weight: 153 lb (69.4 kg)  Height: 5\' 6"  (1.676 m)    Physical Exam  Constitutional: She is oriented to person, place, and time and well-developed, well-nourished, and in no distress.  Wheelchair bound  HENT:  Head: Normocephalic and atraumatic.  Cardiovascular: Normal rate, regular rhythm and normal heart sounds.   No murmur heard. Pulmonary/Chest: Effort normal and breath sounds normal. She has no wheezes.  Abdominal:  Soft. Bowel sounds are normal.  Musculoskeletal: She exhibits no edema.  Neurological: She is alert and oriented to person, place, and time.  Psychiatric: Mood, memory, affect and judgment normal.  Nursing note and vitals reviewed.    Assessment & Plan  1. Type 2 diabetes mellitus without complication, without Zappia-term current use of insulin (HCC) A1c is 5.8%, well-controlled diabetes, no change in pharmacotherapy - POCT HgB A1C - metFORMIN (GLUCOPHAGE) 500 MG tablet; TAKE ONE TABLET BY MOUTH EVERY DAY WITH BREAKFAST  Dispense: 90 tablet; Refill: 0 - sitaGLIPtin (JANUVIA) 100 MG tablet; ONE TABLET BY MOUTH ONCE A DAY. (DIABETES)  Dispense: 90 tablet; Refill: 0  2. Onychomycosis of toenail  - Ambulatory referral to Podiatry  3. Pure hypercholesterolemia  - Lipid panel - COMPLETE METABOLIC PANEL WITH GFR - simvastatin (ZOCOR) 10 MG tablet; Take 1 tablet (10 mg total) by mouth at bedtime.  Dispense: 90 tablet; Refill: 0  4. Absent peripheral pulse Referral for absent peripheral pulses, cold lower extremities. - Ambulatory referral to Vascular Surgery  5. Need for influenza vaccination  - Flu Vaccine  QUAD 6+ mos PF IM (Fluarix Quad PF)  6. Need for immunization against influenza  - Flu Vaccine QUAD 36+ mos IM  Santana Edell [facility] 10/19/2016 9:45 AM

## 2016-10-23 DIAGNOSIS — R2689 Other abnormalities of gait and mobility: Secondary | ICD-10-CM | POA: Diagnosis not present

## 2016-10-23 DIAGNOSIS — M6281 Muscle weakness (generalized): Secondary | ICD-10-CM | POA: Diagnosis not present

## 2016-10-26 ENCOUNTER — Encounter: Payer: Self-pay | Admitting: Emergency Medicine

## 2016-10-26 ENCOUNTER — Emergency Department
Admission: EM | Admit: 2016-10-26 | Discharge: 2016-10-26 | Disposition: A | Discharge status: Home / Self Care | Payer: Medicare Other | Attending: Emergency Medicine | Admitting: Emergency Medicine

## 2016-10-26 ENCOUNTER — Emergency Department: Payer: Medicare Other

## 2016-10-26 DIAGNOSIS — J40 Bronchitis, not specified as acute or chronic: Secondary | ICD-10-CM

## 2016-10-26 DIAGNOSIS — E119 Type 2 diabetes mellitus without complications: Secondary | ICD-10-CM | POA: Insufficient documentation

## 2016-10-26 DIAGNOSIS — Z7984 Long term (current) use of oral hypoglycemic drugs: Secondary | ICD-10-CM | POA: Diagnosis not present

## 2016-10-26 DIAGNOSIS — Z79899 Other long term (current) drug therapy: Secondary | ICD-10-CM | POA: Diagnosis not present

## 2016-10-26 DIAGNOSIS — Z87891 Personal history of nicotine dependence: Secondary | ICD-10-CM | POA: Diagnosis not present

## 2016-10-26 DIAGNOSIS — F801 Expressive language disorder: Secondary | ICD-10-CM | POA: Diagnosis not present

## 2016-10-26 DIAGNOSIS — F329 Major depressive disorder, single episode, unspecified: Secondary | ICD-10-CM | POA: Diagnosis not present

## 2016-10-26 DIAGNOSIS — R05 Cough: Secondary | ICD-10-CM | POA: Diagnosis not present

## 2016-10-26 DIAGNOSIS — I10 Essential (primary) hypertension: Secondary | ICD-10-CM | POA: Insufficient documentation

## 2016-10-26 MED ORDER — PSEUDOEPH-BROMPHEN-DM 30-2-10 MG/5ML PO SYRP: 10.0000 mL | ORAL_SOLUTION | Freq: Four times a day (QID) | ORAL | 0 refills | Status: DC | PRN

## 2016-10-26 MED ORDER — PREDNISONE 50 MG PO TABS: 50.0000 mg | ORAL_TABLET | Freq: Every day | ORAL | 0 refills | Status: DC

## 2016-10-26 MED ORDER — ALBUTEROL SULFATE HFA 108 (90 BASE) MCG/ACT IN AERS: 2.0000 | INHALATION_SPRAY | RESPIRATORY_TRACT | 0 refills | Status: DC | PRN

## 2016-10-26 NOTE — ED Triage Notes (Signed)
Pt here with caregivers from group home. Pt caregivers report cough began yesterday and sounds congested. Denies fever at home. Denies NVD. Respirations even and non labored. No apparent distress noted in triage. Denies pain.

## 2016-10-26 NOTE — ED Notes (Signed)
See triage note  Presents with caregivers with cough. Sxs' started couple of days ago  Staff has not noticed any fever at home and afebrile on arrival

## 2016-10-26 NOTE — ED Provider Notes (Signed)
[facility] Emergency Department Provider Note  ____________________________________________  Time seen: Approximately 11:24 AM  I have reviewed the triage vital signs and the nursing notes.   HISTORY  Chief Complaint Cough  Patient presents with caregivers.Patient has decreased intellectual function and is noncontributory to history  HPI [name] [name] is a 56 y.o. female presents to emergency department with her caregivers for complaint of cough 2 days. Cough began yesterday. Nonproductive. No nasal congestion or complaints of sore throat. No fevers or chills. No difficulty breathing or his use of assessor muscles to breathe. No complaints at this time. No medications for this complaint prior to arrival.   Past Medical History:  Diagnosis Date  . Arthritis   . Borderline intellectual functioning   . Diabetes mellitus without complication ([place])   . Double vision   . Hypertension   . Osteoporosis   . 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[place])   . 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 Traumatic brain injury [facility])     Patient Active Problem List   Diagnosis Date Noted  . Sepsis ([place]) 08/15/2016  . UTI (urinary tract infection) 03/06/2016  . Dysphagia 12/11/2015  . Type 2 diabetes mellitus ([place]) 12/03/2014  . Traumatic brain injury ([place]) 12/03/2014  . Dehydration 08/23/2014  . Chronic constipation 08/23/2014  . Clinical depression 08/23/2014  . Diabetes mellitus, type 2 ([place]) 08/23/2014  . BP (high blood pressure) 08/23/2014  . Injury brain, traumatic ([place]) 08/23/2014  . OP (osteoporosis) 08/23/2014  . HLD (hyperlipidemia) 08/23/2014  . Chest pain syndrome 08/23/2014  . Cognitive impairment 08/23/2014    No past surgical history on file.  Prior to Admission medications   Medication Sig Start Date End Date Taking? Authorizing Provider  ACCU-CHEK AVIVA PLUS test strip CHECK BLOOD SUGAR TWICE A WEEK 09/16/15   Ancil Boozer, Drue Stager, MD  albuterol (PROVENTIL  HFA;VENTOLIN HFA) 108 (90 Base) MCG/ACT inhaler Inhale 2 puffs into the lungs every 4 (four) hours as needed for wheezing or shortness of breath. 10/26/16   Avien Taha, Charline Bills, PA-C  Alcohol Swabs (B-D SINGLE USE SWABS REGULAR) PADS USE AS DIRECTED (CHECK BLOOD SUGAR TWICE A WEEK) 09/16/15   Ancil Boozer, Drue Stager, MD  alendronate (FOSAMAX) 70 MG tablet 1 TAB PO WEEKLY EARLY AM BEFORE FOOD/MEDS W/WATER. DON'T LIE DOWN FOR30 MIN (OSTEOPOROSIS) *NO CRUSH* 08/03/16   Lada, Satira Anis, MD  brompheniramine-pseudoephedrine-DM 30-2-10 MG/5ML syrup Take 10 mLs by mouth 4 (four) times daily as needed. 10/26/16   Dilpreet Faires, Charline Bills, PA-C  calcium-vitamin D (OSCAL WITH D) 500-200 MG-UNIT tablet Take 1 tablet by mouth 2 (two) times daily.    [provider]  cholecalciferol (VITAMIN D) 400 units TABS tablet TAKE ONE TABLET BY MOUTH 2 TIMES A DAY 01/07/16   Roselee Nova, MD  cilostazol (PLETAL) 50 MG tablet TAKE ONE TABLET BY MOUTH TWO TIMES A DAY 09/01/16   Lada, Satira Anis, MD  desmopressin (DDAVP) 0.2 MG tablet TAKE 1 TABLET BY MOUTH AT BEDTIME. 08/03/16   Lada, Satira Anis, MD  donepezil (ARICEPT) 10 MG tablet Take 1 tablet (10 mg total) by mouth at bedtime. 09/01/16   Arnetha Courser, MD  ferrous sulfate 325 (65 FE) MG tablet Take 325 mg by mouth daily.    [provider]  FLUoxetine (PROZAC) 10 MG tablet Take 30 mg by mouth every morning.    [provider]  ketoconazole (NIZORAL) 2 % cream APPLY TOPICALLY ONCE DAILY 12/23/15   Landis Martins, DPM  Lurasidone HCl 20  MG TABS Take 1 tablet by mouth every morning.    [provider]  memantine (NAMENDA XR) 28 MG CP24 24 hr capsule Take 28 mg by mouth daily.    [provider]  metFORMIN (GLUCOPHAGE) 500 MG tablet TAKE ONE TABLET BY MOUTH EVERY DAY WITH BREAKFAST 10/19/16   Roselee Nova, MD  metoprolol succinate (TOPROL-XL) 25 MG 24 hr tablet Take 1 tablet (25 mg total) by mouth daily. 08/22/16   Hillary Bow, MD   nortriptyline (PAMELOR) 25 MG capsule Take 1 capsule (25 mg total) by mouth at bedtime. 08/03/16   Arnetha Courser, MD  nystatin cream (MYCOSTATIN) Apply topically as needed for dry skin. 10/17/14   Ashok Norris, MD  omeprazole (PRILOSEC) 20 MG capsule Take 1 capsule (20 mg total) by mouth daily. 09/01/16   Rochel Brome A, MD  polyethylene glycol powder (GLYCOLAX/MIRALAX) powder Take 255 g by mouth once. 10/17/14   Ashok Norris, MD  predniSONE (DELTASONE) 50 MG tablet Take 1 tablet (50 mg total) by mouth daily with breakfast. 10/26/16   Usha Slager, Charline Bills, PA-C  Probiotic Product (PROBIOTIC-10) CAPS Take 1 capsule by mouth daily. 08/03/16   Arnetha Courser, MD  ramipril (ALTACE) 10 MG capsule TAKE 1 CAPSULE BY MOUTH EVERY DAY 09/01/16   Lada, Satira Anis, MD  simvastatin (ZOCOR) 10 MG tablet Take 1 tablet (10 mg total) by mouth at bedtime. 10/19/16   Roselee Nova, MD  sitaGLIPtin (JANUVIA) 100 MG tablet ONE TABLET BY MOUTH ONCE A DAY. (DIABETES) 10/19/16   Roselee Nova, MD  STOOL SOFTENER 100 MG capsule TAKE 1 CAPSULE BY MOUTH TWICE A DAY. (STOOL SOFTNER) (TAKE WITH GLASSWATER) 03/31/16   Roselee Nova, MD  Tea Tree 100 % OIL Apply 1 application topically daily. To nails 08/30/15   Landis Martins, DPM  tolterodine (DETROL LA) 4 MG 24 hr capsule TAKE 1 CAPSULE BY MOUTH ONCE DAILY FOR BLADDER. **DO NOT CRUSH** 09/01/16   Lada, Satira Anis, MD    Allergies Dilantin [phenytoin]; Penicillins; Phenytoin sodium extended; Phenytoin sodium extended; and Sulfa antibiotics  No family history on file.  Social History Social History  Substance Use Topics  . Smoking status: Former Research scientist (life sciences)  . Smokeless tobacco: Never Used  . Alcohol use No     Review of Systems  Constitutional: No fever/chills Eyes: No visual changes. No discharge ENT: No upper respiratory complaints. Cardiovascular: no chest pain. Respiratory: positive cough. No SOB. Gastrointestinal: No abdominal pain.  No nausea, no  vomiting.  No diarrhea.  No constipation. Genitourinary: Negative for dysuria. No hematuria Musculoskeletal: Negative for musculoskeletal pain. Skin: Negative for rash, abrasions, lacerations, ecchymosis. Neurological: Negative for headaches, focal weakness or numbness. 10-point ROS otherwise negative.  ____________________________________________   PHYSICAL EXAM:  VITAL SIGNS: ED Triage Vitals  Enc Vitals Group     BP 10/26/16 0955 121/75     Pulse Rate 10/26/16 0955 95     Resp 10/26/16 0955 18     Temp 10/26/16 0955 97.6 F (36.4 C)     Temp Source 10/26/16 0955 Oral     SpO2 10/26/16 0955 92 %     Weight 10/26/16 0953 153 lb (69.4 kg)     Height 10/26/16 0953 5\' 6"  (1.676 m)     Head Circumference --      Peak Flow --      Pain Score --      Pain Loc --  Pain Edu? --      Excl. in [place]? --      Constitutional: Alert and oriented. Well appearing and in no acute distress. Eyes: Conjunctivae are normal. PERRL. EOMI. Head: Atraumatic. ENT:      Ears: he sees and TMs are unremarkable bilaterally      Nose: No congestion/rhinnorhea.      Mouth/Throat: Mucous membranes are moist. Oropharynx is nonerythematous and nonedematous. Uvula is midline. Neck: No stridor. Neck is supple full range of motion Hematological/Lymphatic/Immunilogical: No cervical lymphadenopathy. Cardiovascular: Normal rate, regular rhythm. Normal S1 and S2.  Good peripheral circulation. Respiratory: Normal respiratory effort without tachypnea or retractions. Lungs with a few scattered expiratory wheezes bilaterally. No rales or rhonchi. Good air entry to the bases with no decreased or absent breath sounds. Gastrointestinal: Bowel sounds 4 quadrants. Soft and nontender to palpation. No guarding or rigidity. No palpable masses. No distention. No CVA tenderness. Musculoskeletal: Full range of motion to all extremities. No gross deformities appreciated. Neurologic:  Normal speech and language. No gross  focal neurologic deficits are appreciated.  Skin:  Skin is warm, dry and intact. No rash noted. Psychiatric: Mood and affect are normal. Speech and behavior are normal. Patient exhibits appropriate insight and judgement.   ____________________________________________   LABS (all labs ordered are listed, but only abnormal results are displayed)  Labs Reviewed - No data to display ____________________________________________  EKG   ____________________________________________  RADIOLOGY [facility] Jidenna Figgs, personally viewed and evaluated these images (plain radiographs) as part of my medical decision making, as well as reviewing the written report by the radiologist.  Dg Chest 2 View  Result Date: 10/26/2016 CLINICAL DATA:  Cough. EXAM: CHEST  2 VIEW COMPARISON:  08/16/2016 FINDINGS: Both lungs are clear. Negative for a pneumothorax. Heart and mediastinum are within normal limits. Chronic degenerative changes in the proximal humeri. No pleural effusions. IMPRESSION: No active cardiopulmonary disease. Electronically Signed   By: Markus Daft M.D.   On: 10/26/2016 11:10    ____________________________________________    PROCEDURES  Procedure(s) performed:    Procedures    Medications - No data to display   ____________________________________________   INITIAL IMPRESSION / ASSESSMENT AND PLAN / ED COURSE  Pertinent labs & imaging results that were available during my care of the patient were reviewed by me and considered in my medical decision making (see chart for details).  Review of the [place] CSRS was performed in accordance of the [place] prior to dispensing any controlled drugs.     Patient's diagnosis is consistent with bronchitis. Chest x-ray reveals no acute consolidation consistent with pneumonia. Exam is otherwise reassuring.. Patient will be discharged home with prescriptions for albuterol, prednisone, Bromfed cough syrup. Patient is to follow up with primary  care as needed or otherwise directed. Patient is given ED precautions to return to the ED for any worsening or new symptoms.     ____________________________________________  FINAL CLINICAL IMPRESSION(S) / ED DIAGNOSES  Final diagnoses:  Bronchitis      NEW MEDICATIONS STARTED DURING THIS VISIT:  New Prescriptions   ALBUTEROL (PROVENTIL HFA;VENTOLIN HFA) 108 (90 BASE) MCG/ACT INHALER    Inhale 2 puffs into the lungs every 4 (four) hours as needed for wheezing or shortness of breath.   BROMPHENIRAMINE-PSEUDOEPHEDRINE-DM 30-2-10 MG/5ML SYRUP    Take 10 mLs by mouth 4 (four) times daily as needed.   PREDNISONE (DELTASONE) 50 MG TABLET    Take 1 tablet (50 mg total) by mouth daily with breakfast.  This chart was dictated using voice recognition software/Dragon. Despite best efforts to proofread, errors can occur which can change the meaning. Any change was purely unintentional.    Darletta Moll, PA-C 10/26/16 1136    Eula Listen, MD 10/26/16 1537

## 2016-10-29 DIAGNOSIS — R2689 Other abnormalities of gait and mobility: Secondary | ICD-10-CM | POA: Diagnosis not present

## 2016-10-29 DIAGNOSIS — M6281 Muscle weakness (generalized): Secondary | ICD-10-CM | POA: Diagnosis not present

## 2016-11-02 ENCOUNTER — Telehealth: Payer: Self-pay

## 2016-11-02 NOTE — Telephone Encounter (Signed)
Spoke with Jeani Hawking from [facility] who states that this pt is in need of her prescriptions. (you fill several medications for this pt) Jeani Hawking states that they have sent multiple fax requests with no response. Pt has an upcoming appt on 11/16/2016. She is a Orthoptist pt. Please advise.

## 2016-11-03 ENCOUNTER — Other Ambulatory Visit: Payer: Self-pay | Admitting: Emergency Medicine

## 2016-11-03 DIAGNOSIS — B351 Tinea unguium: Secondary | ICD-10-CM

## 2016-11-03 MED ORDER — LURASIDONE HCL 20 MG PO TABS: 20.0000 mg | ORAL_TABLET | ORAL | 0 refills | Status: DC

## 2016-11-03 MED ORDER — ALENDRONATE SODIUM 70 MG PO TABS: 70.0000 mg | ORAL_TABLET | Freq: Once | ORAL | 2 refills | Status: AC

## 2016-11-03 MED ORDER — DOCUSATE SODIUM 100 MG PO CAPS: ORAL_CAPSULE | ORAL | 4 refills | Status: DC

## 2016-11-03 MED ORDER — TEA TREE 100 % EX OIL: 1.0000 "application " | TOPICAL_OIL | Freq: Every day | CUTANEOUS | 3 refills | Status: DC

## 2016-11-03 MED ORDER — FLUOXETINE HCL 10 MG PO TABS: 30.0000 mg | ORAL_TABLET | ORAL | 0 refills | Status: DC

## 2016-11-03 MED ORDER — TOLTERODINE TARTRATE ER 4 MG PO CP24: 4.0000 mg | ORAL_CAPSULE | Freq: Every day | ORAL | 2 refills | Status: DC

## 2016-11-03 MED ORDER — PROBIOTIC-10 PO CAPS: 1.0000 | ORAL_CAPSULE | Freq: Every day | ORAL | 2 refills | Status: DC

## 2016-11-03 MED ORDER — CILOSTAZOL 50 MG PO TABS: 50.0000 mg | ORAL_TABLET | Freq: Two times a day (BID) | ORAL | 2 refills | Status: DC

## 2016-11-03 MED ORDER — DESMOPRESSIN ACETATE 0.2 MG PO TABS: 200.0000 ug | ORAL_TABLET | Freq: Every day | ORAL | 0 refills | Status: DC

## 2016-11-03 MED ORDER — CALCIUM CARBONATE-VITAMIN D 500-200 MG-UNIT PO TABS: 1.0000 | ORAL_TABLET | Freq: Two times a day (BID) | ORAL | 5 refills | Status: DC

## 2016-11-03 MED ORDER — METOPROLOL SUCCINATE ER 25 MG PO TB24: 25.0000 mg | ORAL_TABLET | Freq: Every day | ORAL | 2 refills | Status: DC

## 2016-11-03 MED ORDER — NORTRIPTYLINE HCL 25 MG PO CAPS: 25.0000 mg | ORAL_CAPSULE | Freq: Every day | ORAL | 0 refills | Status: DC

## 2016-11-03 MED ORDER — FERROUS SULFATE 325 (65 FE) MG PO TABS: 325.0000 mg | ORAL_TABLET | Freq: Every day | ORAL | 2 refills | Status: DC

## 2016-11-03 MED ORDER — MEMANTINE HCL ER 28 MG PO CP24: 28.0000 mg | ORAL_CAPSULE | Freq: Every day | ORAL | 0 refills | Status: DC

## 2016-11-03 MED ORDER — OMEPRAZOLE 20 MG PO CPDR: 20.0000 mg | DELAYED_RELEASE_CAPSULE | Freq: Every day | ORAL | 1 refills | Status: DC

## 2016-11-03 NOTE — Telephone Encounter (Signed)
Scripts sent to pharmacy 

## 2016-11-05 ENCOUNTER — Encounter: Payer: Self-pay | Admitting: Podiatry

## 2016-11-05 ENCOUNTER — Ambulatory Visit (INDEPENDENT_AMBULATORY_CARE_PROVIDER_SITE_OTHER): Payer: Medicare Other | Admitting: Podiatry

## 2016-11-05 VITALS — BP 125/82 | HR 110

## 2016-11-05 DIAGNOSIS — M79674 Pain in right toe(s): Secondary | ICD-10-CM

## 2016-11-05 DIAGNOSIS — B353 Tinea pedis: Secondary | ICD-10-CM

## 2016-11-05 DIAGNOSIS — M79675 Pain in left toe(s): Secondary | ICD-10-CM

## 2016-11-05 DIAGNOSIS — B351 Tinea unguium: Secondary | ICD-10-CM

## 2016-11-05 DIAGNOSIS — E1159 Type 2 diabetes mellitus with other circulatory complications: Secondary | ICD-10-CM

## 2016-11-05 NOTE — Progress Notes (Signed)
   Subjective:    Patient ID: [name] [name], female    DOB: [birth year], 58 y.o.   MRN: [id number]  HPI this patient presents the office with chief complaint of Sivils thick painful nails.  Patient has been brought to the office by 2 guardians.  She is presents the office in a wheelchair for an evaluation and treatment of her painful nails  . She has been last seen in this office in November 2017, but has been seen by Dr. Cleda Mccreedy  and 5/18. This patient does take Pletal and is type II diabetic.  She presents the office for preventative foot care services    Review of Systems     Objective:   Physical Exam General Appearance  Alert, conversant and in no acute stress.  Vascular  Dorsalis pedis and posterior pulses are absent   bilaterally.  Capillary return is elongated.  Bilaterally. Temperature is within normal limits  Bilaterally  Neurologic  Deferred since patient has previous brain trauma.  Nails Thick disfigured discolored nails with subungual debride bilaterally from hallux to fifth toes bilaterally. No evidence of bacterial infection or drainage bilaterally.  Orthopedic  No limitations of motion of motion feet bilaterally.  No crepitus or effusions noted.  No bony pathology or digital deformities noted.  Skin  normotropic skin with no porokeratosis noted bilaterally.  No signs of infections or ulcers noted.          Assessment & Plan:  Onychomycosis  Diabetes with vascular disease.   Debridement of nails.  Patient had problems with the use of the dremel tool. RTC 3 months. Patient also has a swollen right foot in association with a dermatitis that his been previously treated by the doctor at the facility.   Gardiner Barefoot DPM

## 2016-11-12 DIAGNOSIS — M6281 Muscle weakness (generalized): Secondary | ICD-10-CM | POA: Diagnosis not present

## 2016-11-12 DIAGNOSIS — R2689 Other abnormalities of gait and mobility: Secondary | ICD-10-CM | POA: Diagnosis not present

## 2016-11-16 ENCOUNTER — Ambulatory Visit: Payer: Medicare Other | Admitting: Family Medicine

## 2016-11-16 ENCOUNTER — Ambulatory Visit: Payer: Medicare Other

## 2016-11-19 ENCOUNTER — Other Ambulatory Visit: Payer: Self-pay

## 2016-11-26 DIAGNOSIS — M6281 Muscle weakness (generalized): Secondary | ICD-10-CM | POA: Diagnosis not present

## 2016-11-26 DIAGNOSIS — R2689 Other abnormalities of gait and mobility: Secondary | ICD-10-CM | POA: Diagnosis not present

## 2016-12-01 DIAGNOSIS — F339 Major depressive disorder, recurrent, unspecified: Secondary | ICD-10-CM | POA: Diagnosis not present

## 2016-12-02 DIAGNOSIS — E11319 Type 2 diabetes mellitus with unspecified diabetic retinopathy without macular edema: Secondary | ICD-10-CM | POA: Diagnosis not present

## 2016-12-04 ENCOUNTER — Ambulatory Visit: Payer: Medicare Other

## 2016-12-10 DIAGNOSIS — R2689 Other abnormalities of gait and mobility: Secondary | ICD-10-CM | POA: Diagnosis not present

## 2016-12-10 DIAGNOSIS — M6281 Muscle weakness (generalized): Secondary | ICD-10-CM | POA: Diagnosis not present

## 2016-12-11 ENCOUNTER — Other Ambulatory Visit: Payer: Self-pay | Admitting: Family Medicine

## 2016-12-14 ENCOUNTER — Encounter: Payer: Medicare Other | Admitting: Family Medicine

## 2016-12-22 ENCOUNTER — Ambulatory Visit (INDEPENDENT_AMBULATORY_CARE_PROVIDER_SITE_OTHER): Payer: Medicare Other

## 2016-12-22 VITALS — BP 118/70 | HR 76 | Temp 97.0°F | Resp 16 | Ht 66.0 in | Wt 153.0 lb

## 2016-12-22 DIAGNOSIS — Z23 Encounter for immunization: Secondary | ICD-10-CM

## 2016-12-22 DIAGNOSIS — Z1159 Encounter for screening for other viral diseases: Secondary | ICD-10-CM | POA: Diagnosis not present

## 2016-12-22 DIAGNOSIS — Z0000 Encounter for general adult medical examination without abnormal findings: Secondary | ICD-10-CM | POA: Diagnosis not present

## 2016-12-22 NOTE — Patient Instructions (Addendum)
Ms. Stoltzfus , Thank you for taking time to come for your Medicare Wellness Visit. I appreciate your ongoing commitment to your health goals. Please review the following plan we discussed and let me know if I can assist you in the future.   Screening recommendations/referrals: Colonoscopy: Pt and staff believes this exam was performed in 2015. Please provide a copy of your colonoscopy report. Mammogram: Completed 07/28/16. Repeat mammogram in one year. Bone Density: Declined Recommended yearly ophthalmology/optometry visit for glaucoma screening and checkup Recommended yearly dental visit for hygiene and checkup  Vaccinations: Influenza vaccine: Up to date Pneumococcal vaccine: Given PPSV23 today Tdap vaccine: Declined. Please call your insurance company to determine your out of pocket expense. Shingles vaccine: Not required until after age 71  Advanced directives: Advance directive discussed with you today. I have provided a copy for you to complete at home and have notarized. Once this is complete please bring a copy in to our office so we can scan it into your chart.  Conditions/risks identified: Fall risk prevention discussed  Next appointment: You are scheduled to see Dr. Manuella Ghazi on 12/28/16 @ 2:40pm.   Please schedule your annual wellness exam with your Nurse Health Advisor in one year.  Pneumococcal Vaccine, Polyvalent solution for injection What is this medicine? PNEUMOCOCCAL VACCINE, POLYVALENT (NEU mo KOK al vak SEEN, pol ee VEY luhnt) is a vaccine to prevent pneumococcus bacteria infection. These bacteria are a major cause of ear infections, Strep throat infections, and serious pneumonia, meningitis, or blood infections worldwide. These vaccines help the body to produce antibodies (protective substances) that help your body defend against these bacteria. This vaccine is recommended for people 69 years of age and older with health problems. It is also recommended for all adults over 69 years  old. This vaccine will not treat an infection. This medicine may be used for other purposes; ask your health care provider or pharmacist if you have questions. COMMON BRAND NAME(S): Pneumovax 23 What should I tell my health care provider before I take this medicine? They need to know if you have any of these conditions: -bleeding problems -bone marrow or organ transplant -cancer, Hodgkin's disease -fever -infection -immune system problems -low platelet count in the blood -seizures -an unusual or allergic reaction to pneumococcal vaccine, diphtheria toxoid, other vaccines, latex, other medicines, foods, dyes, or preservatives -pregnant or trying to get pregnant -breast-feeding How should I use this medicine? This vaccine is for injection into a muscle or under the skin. It is given by a health care professional. A copy of Vaccine Information Statements will be given before each vaccination. Read this sheet carefully each time. The sheet may change frequently. Talk to your pediatrician regarding the use of this medicine in children. While this drug may be prescribed for children as young as 92 years of age for selected conditions, precautions do apply. Overdosage: If you think you have taken too much of this medicine contact a poison control center or emergency room at once. NOTE: This medicine is only for you. Do not share this medicine with others. What if I miss a dose? It is important not to miss your dose. Call your doctor or health care professional if you are unable to keep an appointment. What may interact with this medicine? -medicines for cancer chemotherapy -medicines that suppress your immune function -medicines that treat or prevent blood clots like warfarin, enoxaparin, and dalteparin -steroid medicines like prednisone or cortisone This list may not describe all possible interactions. Give your  health care provider a list of all the medicines, herbs, non-prescription drugs, or  dietary supplements you use. Also tell them if you smoke, drink alcohol, or use illegal drugs. Some items may interact with your medicine. What should I watch for while using this medicine? Mild fever and pain should go away in 3 days or less. Report any unusual symptoms to your doctor or health care professional. What side effects may I notice from receiving this medicine? Side effects that you should report to your doctor or health care professional as soon as possible: -allergic reactions like skin rash, itching or hives, swelling of the face, lips, or tongue -breathing problems -confused -fever over 102 degrees F -pain, tingling, numbness in the hands or feet -seizures -unusual bleeding or bruising -unusual muscle weakness Side effects that usually do not require medical attention (report to your doctor or health care professional if they continue or are bothersome): -aches and pains -diarrhea -fever of 102 degrees F or less -headache -irritable -loss of appetite -pain, tender at site where injected -trouble sleeping This list may not describe all possible side effects. Call your doctor for medical advice about side effects. You may report side effects to FDA at 1-800-FDA-1088. Where should I keep my medicine? This does not apply. This vaccine is given in a clinic, pharmacy, doctor's office, or other health care setting and will not be stored at home. NOTE: This sheet is a summary. It may not cover all possible information. If you have questions about this medicine, talk to your doctor, pharmacist, or health care provider.  2018 Elsevier/Gold Standard (2007-08-19 14:32:37)    Preventive Care 40-64 Years, Female Preventive care refers to lifestyle choices and visits with your health care provider that can promote health and wellness. What does preventive care include?  A yearly physical exam. This is also called an annual well check.  Dental exams once or twice a  year.  Routine eye exams. Ask your health care provider how often you should have your eyes checked.  Personal lifestyle choices, including:  Daily care of your teeth and gums.  Regular physical activity.  Eating a healthy diet.  Avoiding tobacco and drug use.  Limiting alcohol use.  Practicing safe sex.  Taking low-dose aspirin daily starting at age 8.  Taking vitamin and mineral supplements as recommended by your health care provider. What happens during an annual well check? The services and screenings done by your health care provider during your annual well check will depend on your age, overall health, lifestyle risk factors, and family history of disease. Counseling  Your health care provider may ask you questions about your:  Alcohol use.  Tobacco use.  Drug use.  Emotional well-being.  Home and relationship well-being.  Sexual activity.  Eating habits.  Work and work Statistician.  Method of birth control.  Menstrual cycle.  Pregnancy history. Screening  You may have the following tests or measurements:  Height, weight, and BMI.  Blood pressure.  Lipid and cholesterol levels. These may be checked every 5 years, or more frequently if you are over 40 years old.  Skin check.  Lung cancer screening. You may have this screening every year starting at age 63 if you have a 30-pack-year history of smoking and currently smoke or have quit within the past 15 years.  Fecal occult blood test (FOBT) of the stool. You may have this test every year starting at age 54.  Flexible sigmoidoscopy or colonoscopy. You may have a sigmoidoscopy  every 5 years or a colonoscopy every 10 years starting at age 47.  Hepatitis C blood test.  Hepatitis B blood test.  Sexually transmitted disease (STD) testing.  Diabetes screening. This is done by checking your blood sugar (glucose) after you have not eaten for a while (fasting). You may have this done every 1-3  years.  Mammogram. This may be done every 1-2 years. Talk to your health care provider about when you should start having regular mammograms. This may depend on whether you have a family history of breast cancer.  BRCA-related cancer screening. This may be done if you have a family history of breast, ovarian, tubal, or peritoneal cancers.  Pelvic exam and Pap test. This may be done every 3 years starting at age 70. Starting at age 32, this may be done every 5 years if you have a Pap test in combination with an HPV test.  Bone density scan. This is done to screen for osteoporosis. You may have this scan if you are at high risk for osteoporosis. Discuss your test results, treatment options, and if necessary, the need for more tests with your health care provider. Vaccines  Your health care provider may recommend certain vaccines, such as:  Influenza vaccine. This is recommended every year.  Tetanus, diphtheria, and acellular pertussis (Tdap, Td) vaccine. You may need a Td booster every 10 years.  Zoster vaccine. You may need this after age 46.  Pneumococcal 13-valent conjugate (PCV13) vaccine. You may need this if you have certain conditions and were not previously vaccinated.  Pneumococcal polysaccharide (PPSV23) vaccine. You may need one or two doses if you smoke cigarettes or if you have certain conditions. Talk to your health care provider about which screenings and vaccines you need and how often you need them. This information is not intended to replace advice given to you by your health care provider. Make sure you discuss any questions you have with your health care provider. Document Released: 02/08/2015 Document Revised: 10/02/2015 Document Reviewed: 11/13/2014 Elsevier Interactive Patient Education  2017 [place] Prevention in the Home Falls can cause injuries. They can happen to people of all ages. There are many things you can do to make your home safe and to  help prevent falls. What can I do on the outside of my home?  Regularly fix the edges of walkways and driveways and fix any cracks.  Remove anything that might make you trip as you walk through a door, such as a raised step or threshold.  Trim any bushes or trees on the path to your home.  Use bright outdoor lighting.  Clear any walking paths of anything that might make someone trip, such as rocks or tools.  Regularly check to see if handrails are loose or broken. Make sure that both sides of any steps have handrails.  Any raised decks and porches should have guardrails on the edges.  Have any leaves, snow, or ice cleared regularly.  Use sand or salt on walking paths during winter.  Clean up any spills in your garage right away. This includes oil or grease spills. What can I do in the bathroom?  Use night lights.  Install grab bars by the toilet and in the tub and shower. Do not use towel bars as grab bars.  Use non-skid mats or decals in the tub or shower.  If you need to sit down in the shower, use a plastic, non-slip stool.  Keep the floor  dry. Clean up any water that spills on the floor as soon as it happens.  Remove soap buildup in the tub or shower regularly.  Attach bath mats securely with double-sided non-slip rug tape.  Do not have throw rugs and other things on the floor that can make you trip. What can I do in the bedroom?  Use night lights.  Make sure that you have a light by your bed that is easy to reach.  Do not use any sheets or blankets that are too big for your bed. They should not hang down onto the floor.  Have a firm chair that has side arms. You can use this for support while you get dressed.  Do not have throw rugs and other things on the floor that can make you trip. What can I do in the kitchen?  Clean up any spills right away.  Avoid walking on wet floors.  Keep items that you use a lot in easy-to-reach places.  If you need to reach  something above you, use a strong step stool that has a grab bar.  Keep electrical cords out of the way.  Do not use floor polish or wax that makes floors slippery. If you must use wax, use non-skid floor wax.  Do not have throw rugs and other things on the floor that can make you trip. What can I do with my stairs?  Do not leave any items on the stairs.  Make sure that there are handrails on both sides of the stairs and use them. Fix handrails that are broken or loose. Make sure that handrails are as [place] as the stairways.  Check any carpeting to make sure that it is firmly attached to the stairs. Fix any carpet that is loose or worn.  Avoid having throw rugs at the top or bottom of the stairs. If you do have throw rugs, attach them to the floor with carpet tape.  Make sure that you have a light switch at the top of the stairs and the bottom of the stairs. If you do not have them, ask someone to add them for you. What else can I do to help prevent falls?  Wear shoes that:  Do not have high heels.  Have rubber bottoms.  Are comfortable and fit you well.  Are closed at the toe. Do not wear sandals.  If you use a stepladder:  Make sure that it is fully opened. Do not climb a closed stepladder.  Make sure that both sides of the stepladder are locked into place.  Ask someone to hold it for you, if possible.  Clearly mark and make sure that you can see:  Any grab bars or handrails.  First and last steps.  Where the edge of each step is.  Use tools that help you move around (mobility aids) if they are needed. These include:  Canes.  Walkers.  Scooters.  Crutches.  Turn on the lights when you go into a dark area. Replace any light bulbs as soon as they burn out.  Set up your furniture so you have a clear path. Avoid moving your furniture around.  If any of your floors are uneven, fix them.  If there are any pets around you, be aware of where they are.  Review  your medicines with your doctor. Some medicines can make you feel dizzy. This can increase your chance of falling. Ask your doctor what other things that you can do to help  prevent falls. This information is not intended to replace advice given to you by your health care provider. Make sure you discuss any questions you have with your health care provider. Document Released: 11/08/2008 Document Revised: 06/20/2015 Document Reviewed: 02/16/2014 Elsevier Interactive Patient Education  2017 [employer].
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
## 2016-12-23 ENCOUNTER — Ambulatory Visit: Payer: Medicare Other | Admitting: Family Medicine

## 2016-12-23 LAB — HEPATITIS C ANTIBODY
Hepatitis C Ab: NONREACTIVE
SIGNAL TO CUT-OFF: 0.05 (ref <1.00)

## 2016-12-24 ENCOUNTER — Ambulatory Visit: Payer: Medicare Other | Admitting: Family Medicine

## 2016-12-24 DIAGNOSIS — M6281 Muscle weakness (generalized): Secondary | ICD-10-CM | POA: Diagnosis not present

## 2016-12-24 DIAGNOSIS — R2689 Other abnormalities of gait and mobility: Secondary | ICD-10-CM | POA: Diagnosis not present

## 2016-12-28 ENCOUNTER — Ambulatory Visit (INDEPENDENT_AMBULATORY_CARE_PROVIDER_SITE_OTHER): Payer: Medicare Other | Admitting: Family Medicine

## 2016-12-28 ENCOUNTER — Encounter: Payer: Self-pay | Admitting: Family Medicine

## 2016-12-28 VITALS — BP 118/74 | HR 86 | Temp 98.6°F | Resp 16 | Wt 153.0 lb

## 2016-12-28 DIAGNOSIS — Z022 Encounter for examination for admission to residential institution: Secondary | ICD-10-CM | POA: Diagnosis not present

## 2016-12-28 NOTE — Progress Notes (Signed)
Name: [name] [name]   MRN: [id number]    DOB: 1960/05/03   Date:12/28/2016       Progress Note  Subjective  Chief Complaint  Chief Complaint  Patient presents with  . forms    FL2    HPI  Pt. Presents for completion of FL-2 form, she is a resident in [facility], has history of traumatic brain injury and cognitive impairment, otherwise doing well, no change in history or medications.    Past Medical History:  Diagnosis Date  . Arthritis   . Borderline intellectual functioning   . Diabetes mellitus without complication ([place])   . Double vision   . Hypertension   . Osteoporosis   . 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[place])   . 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 Traumatic brain injury ([place])     No past surgical history on file.  No family history on file.  Social History   Socioeconomic History  . Marital status: Single    Spouse name: Not on file  . Number of children: 0  . Years of education: Not on file  . Highest education level: Not on file  Social Needs  . Financial resource strain: Not hard at all  . Food insecurity - worry: Never true  . Food insecurity - inability: Never true  . Transportation needs - medical: No  . Transportation needs - non-medical: No  Occupational History  . Not on file  Tobacco Use  . Smoking status: Former Smoker    Types: Cigarettes  . Smokeless tobacco: Never Used  Substance and Sexual Activity  . Alcohol use: No    Alcohol/week: 0.0 oz  . Drug use: No  . Sexual activity: Not Currently  Other Topics Concern  . Not on file  Social History Narrative  . Not on file     Current Outpatient Medications:  .  ACCU-CHEK AVIVA PLUS test strip, CHECK BLOOD SUGAR TWICE A WEEK, Disp: 100 each, Rfl: PRN .  albuterol (PROVENTIL HFA;VENTOLIN HFA) 108 (90 Base) MCG/ACT inhaler, Inhale 2 puffs into the lungs every 4 (four) hours as needed for wheezing or shortness of breath., Disp: 1 Inhaler, Rfl: 0 .  Alcohol Swabs (B-D SINGLE USE  SWABS REGULAR) PADS, USE AS DIRECTED (CHECK BLOOD SUGAR TWICE A WEEK), Disp: 100 each, Rfl: PRN .  alendronate (FOSAMAX) 70 MG tablet, Take 70 mg by mouth once a week., Disp: , Rfl:  .  brompheniramine-pseudoephedrine-DM 30-2-10 MG/5ML syrup, Take 10 mLs by mouth 4 (four) times daily as needed., Disp: 200 mL, Rfl: 0 .  calcium-vitamin D (OSCAL WITH D) 500-200 MG-UNIT tablet, Take 1 tablet by mouth 2 (two) times daily., Disp: 60 tablet, Rfl: 5 .  cholecalciferol (VITAMIN D) 400 units TABS tablet, TAKE ONE TABLET BY MOUTH 2 TIMES A DAY, Disp: 60 tablet, Rfl: 11 .  cilostazol (PLETAL) 50 MG tablet, Take 1 tablet (50 mg total) by mouth 2 (two) times daily., Disp: 60 tablet, Rfl: 2 .  desmopressin (DDAVP) 0.2 MG tablet, TAKE ONE TABLET BY MOUTH AT BEDTIME., Disp: 30 tablet, Rfl: 0 .  docusate sodium (STOOL SOFTENER) 100 MG capsule, TAKE 1 CAPSULE BY MOUTH TWICE A DAY. (STOOL SOFTNER) (TAKE WITH GLASSWATER), Disp: 60 capsule, Rfl: 4 .  donepezil (ARICEPT) 10 MG tablet, Take 1 tablet (10 mg total) by mouth at bedtime., Disp: 7 tablet, Rfl: 0 .  FLUoxetine (PROZAC) 10 MG tablet, Take 3 tablets (30 mg total) by mouth every morning., Disp: 30 tablet, Rfl:  0 .  ketoconazole (NIZORAL) 2 % cream, APPLY TOPICALLY ONCE DAILY, Disp: 15 g, Rfl: 0 .  LATUDA 20 MG TABS tablet, TAKE ONE TABLET BY MOUTH EACH DAY., Disp: 30 tablet, Rfl: 0 .  metFORMIN (GLUCOPHAGE) 500 MG tablet, TAKE ONE TABLET BY MOUTH EVERY DAY WITH BREAKFAST, Disp: 90 tablet, Rfl: 0 .  metoprolol succinate (TOPROL-XL) 25 MG 24 hr tablet, Take 1 tablet (25 mg total) by mouth daily., Disp: 30 tablet, Rfl: 2 .  NAMENDA XR 28 MG CP24 24 hr capsule, TAKE ONE CAPSULE BY MOUTH ONCE A DAY. ** DO NOT CRUSH **, Disp: 30 capsule, Rfl: 0 .  nortriptyline (PAMELOR) 25 MG capsule, TAKE ONE CAPSULE BY MOUTH AT BEDTIME., Disp: 30 capsule, Rfl: 0 .  nystatin cream (MYCOSTATIN), Apply topically as needed for dry skin., Disp: 120 g, Rfl: 0 .  omeprazole (PRILOSEC) 20 MG  capsule, Take 1 capsule (20 mg total) by mouth daily., Disp: 90 capsule, Rfl: 1 .  polyethylene glycol powder (GLYCOLAX/MIRALAX) powder, Take 255 g by mouth once., Disp: 3350 g, Rfl: 1 .  Probiotic Product (PROBIOTIC-10) CAPS, Take 1 capsule by mouth daily., Disp: 30 capsule, Rfl: 2 .  ramipril (ALTACE) 10 MG capsule, TAKE 1 CAPSULE BY MOUTH EVERY DAY, Disp: 7 capsule, Rfl: 0 .  simvastatin (ZOCOR) 10 MG tablet, Take 1 tablet (10 mg total) by mouth at bedtime., Disp: 90 tablet, Rfl: 0 .  sitaGLIPtin (JANUVIA) 100 MG tablet, ONE TABLET BY MOUTH ONCE A DAY. (DIABETES), Disp: 90 tablet, Rfl: 0 .  Tea Tree 100 % OIL, Apply 1 application topically daily. To nails, Disp: 60 mL, Rfl: 3 .  tolterodine (DETROL LA) 4 MG 24 hr capsule, Take 1 capsule (4 mg total) by mouth daily., Disp: 30 capsule, Rfl: 2 .  ferrous sulfate 325 (65 FE) MG tablet, Take 1 tablet (325 mg total) by mouth daily., Disp: 30 tablet, Rfl: 2 .  predniSONE (DELTASONE) 50 MG tablet, Take 1 tablet (50 mg total) by mouth daily with breakfast. (Patient not taking: Reported on 12/22/2016), Disp: 5 tablet, Rfl: 0  Allergies  Allergen Reactions  . Dilantin [Phenytoin] Other (See Comments)    unknown  . Penicillins Other (See Comments)    .Has patient had a PCN reaction causing immediate rash, facial/tongue/throat swelling, SOB or lightheadedness with hypotension: Unknown Has patient had a PCN reaction causing severe rash involving mucus membranes or skin necrosis: Unknown Has patient had a PCN reaction that required hospitalization: Unknown Has patient had a PCN reaction occurring within the last 10 years: Unknown If all of the above answers are "NO", then may proceed with Cephalosporin use.   Marland Kitchen Phenytoin Sodium Extended Other (See Comments)    Other reaction(s): Other (See Comments)  . Phenytoin Sodium Extended Other (See Comments)    Other reaction(s): Other (See Comments)  . Sulfa Antibiotics Rash and Other (See Comments)    unknown      ROS    Objective  Vitals:   12/28/16 1541  BP: 118/74  Pulse: 86  Resp: 16  Temp: 98.6 F (37 C)  TempSrc: Oral  SpO2: 97%  Weight: 153 lb (69.4 kg)    Physical Exam  Constitutional: She is well-developed, well-nourished, and in no distress.  HENT:  Head: Normocephalic and atraumatic.  Cardiovascular: Normal rate, regular rhythm and normal heart sounds.  No murmur heard. Pulmonary/Chest: Effort normal and breath sounds normal.  Abdominal: Soft. Bowel sounds are normal.  Neurological: She is alert.  Psychiatric: Mood  and affect normal.  Nursing note and vitals reviewed.    Recent Results (from the past 2160 hour(s))  POCT HgB A1C     Status: Normal   Collection Time: 10/19/16 10:02 AM  Result Value Ref Range   Hemoglobin A1C 5.8   Lipid panel     Status: None   Collection Time: 10/19/16 10:57 AM  Result Value Ref Range   Cholesterol 152 <200 mg/dL   HDL 73 >50 mg/dL   Triglycerides 91 <150 mg/dL   LDL Cholesterol (Calc) 62 mg/dL (calc)    Comment: Reference range: <100 . Desirable range <100 mg/dL for primary prevention;   <70 mg/dL for patients with CHD or diabetic patients  with > or = 2 CHD risk factors. Marland Kitchen LDL-C is now calculated using the Martin-Hopkins  calculation, which is a validated novel method providing  better accuracy than the Friedewald equation in the  estimation of LDL-C.  Cresenciano Genre et al. Annamaria Helling. 4098;119(14): 2061-2068  (http://education.[employer].com/faq/FAQ164)    Total CHOL/HDL Ratio 2.1 <5.0 (calc)   Non-HDL Cholesterol (Calc) 79 <130 mg/dL (calc)    Comment: For patients with diabetes plus 1 major ASCVD risk  factor, treating to a non-HDL-C goal of <100 mg/dL  (LDL-C of <70 mg/dL) is considered a therapeutic  option.   COMPLETE METABOLIC PANEL WITH GFR     Status: Abnormal   Collection Time: 10/19/16 10:57 AM  Result Value Ref Range   Glucose, Bld 108 (H) 65 - 99 mg/dL    Comment: .            Fasting reference  interval . For someone without known diabetes, a glucose value between 100 and 125 mg/dL is consistent with prediabetes and should be confirmed with a follow-up test. .    BUN 10 7 - 25 mg/dL   Creat 0.88 0.50 - 1.05 mg/dL    Comment: For patients >96 years of age, the reference limit for Creatinine is approximately 13% higher for people identified as African-American. .    GFR, Est Non African American 73 > OR = 60 mL/min/1.61m2   GFR, Est African American 85 > OR = 60 mL/min/1.80m2   BUN/Creatinine Ratio NOT APPLICABLE 6 - 22 (calc)   Sodium 137 135 - 146 mmol/L   Potassium 5.1 3.5 - 5.3 mmol/L   Chloride 97 (L) 98 - 110 mmol/L   CO2 33 (H) 20 - 32 mmol/L   Calcium 9.8 8.6 - 10.4 mg/dL   Total Protein 6.8 6.1 - 8.1 g/dL   Albumin 3.6 3.6 - 5.1 g/dL   Globulin 3.2 1.9 - 3.7 g/dL (calc)   AG Ratio 1.1 1.0 - 2.5 (calc)   Total Bilirubin 0.2 0.2 - 1.2 mg/dL   Alkaline phosphatase (APISO) 105 33 - 130 U/L   AST 14 10 - 35 U/L   ALT 10 6 - 29 U/L  Hepatitis C antibody screen     Status: None   Collection Time: 12/22/16  9:58 AM  Result Value Ref Range   Hepatitis C Ab NON-REACTIVE NON-REACTI   SIGNAL TO CUT-OFF 0.05 <1.00     Assessment & Plan  1. Encounter for examination for admission to assisted living facility Reviewed medications, diagnoses and plan of care, completed FL-2 form for the patient.  Note: Total face-to-face time 25 minutes, greater than 50% was spent in counseling and coordination of care with the patient. This included reviewing the group home forms, reviewing FL 2 diagnoses, ICD 10 codes, medications and patient's  overall plan. Jalil Lorusso Asad A. [facility] 12/28/2016 3:51 PM

## 2016-12-30 ENCOUNTER — Other Ambulatory Visit: Payer: Self-pay | Admitting: Family Medicine

## 2016-12-31 DIAGNOSIS — R2689 Other abnormalities of gait and mobility: Secondary | ICD-10-CM | POA: Diagnosis not present

## 2016-12-31 DIAGNOSIS — M6281 Muscle weakness (generalized): Secondary | ICD-10-CM | POA: Diagnosis not present

## 2017-01-15 DIAGNOSIS — K2270 Barrett's esophagus without dysplasia: Secondary | ICD-10-CM | POA: Diagnosis not present

## 2017-01-26 ENCOUNTER — Other Ambulatory Visit: Payer: Self-pay | Admitting: Family Medicine

## 2017-02-01 ENCOUNTER — Other Ambulatory Visit: Payer: Self-pay

## 2017-02-01 DIAGNOSIS — E119 Type 2 diabetes mellitus without complications: Secondary | ICD-10-CM

## 2017-02-01 DIAGNOSIS — E7800 Pure hypercholesterolemia, unspecified: Secondary | ICD-10-CM

## 2017-02-01 MED ORDER — NORTRIPTYLINE HCL 25 MG PO CAPS: 25.0000 mg | ORAL_CAPSULE | Freq: Every day | ORAL | 0 refills | Status: DC

## 2017-02-01 MED ORDER — CILOSTAZOL 50 MG PO TABS: 50.0000 mg | ORAL_TABLET | Freq: Two times a day (BID) | ORAL | 2 refills | Status: DC

## 2017-02-01 MED ORDER — METFORMIN HCL 500 MG PO TABS: ORAL_TABLET | ORAL | 0 refills | Status: DC

## 2017-02-01 MED ORDER — METOPROLOL SUCCINATE ER 25 MG PO TB24: 25.0000 mg | ORAL_TABLET | Freq: Every day | ORAL | 2 refills | Status: DC

## 2017-02-01 MED ORDER — TOLTERODINE TARTRATE ER 4 MG PO CP24: 4.0000 mg | ORAL_CAPSULE | Freq: Every day | ORAL | 2 refills | Status: DC

## 2017-02-01 MED ORDER — SITAGLIPTIN PHOSPHATE 100 MG PO TABS: ORAL_TABLET | ORAL | 0 refills | Status: DC

## 2017-02-01 MED ORDER — PROBIOTIC-10 PO CAPS: 1.0000 | ORAL_CAPSULE | Freq: Every day | ORAL | 2 refills | Status: DC

## 2017-02-01 MED ORDER — LURASIDONE HCL 20 MG PO TABS: ORAL_TABLET | ORAL | 0 refills | Status: DC

## 2017-02-01 MED ORDER — FERROUS SULFATE 325 (65 FE) MG PO TABS: 325.0000 mg | ORAL_TABLET | Freq: Every day | ORAL | 2 refills | Status: DC

## 2017-02-01 MED ORDER — ALENDRONATE SODIUM 70 MG PO TABS: 70.0000 mg | ORAL_TABLET | ORAL | 0 refills | Status: DC

## 2017-02-01 MED ORDER — DESMOPRESSIN ACETATE 0.2 MG PO TABS: 200.0000 ug | ORAL_TABLET | Freq: Every day | ORAL | 0 refills | Status: DC

## 2017-02-01 MED ORDER — MEMANTINE HCL ER 28 MG PO CP24: ORAL_CAPSULE | ORAL | 0 refills | Status: DC

## 2017-02-01 MED ORDER — SIMVASTATIN 10 MG PO TABS: 10.0000 mg | ORAL_TABLET | Freq: Every day | ORAL | 0 refills | Status: DC

## 2017-02-04 ENCOUNTER — Ambulatory Visit (INDEPENDENT_AMBULATORY_CARE_PROVIDER_SITE_OTHER): Payer: Medicare Other | Admitting: Podiatry

## 2017-02-04 ENCOUNTER — Encounter: Payer: Self-pay | Admitting: Podiatry

## 2017-02-04 DIAGNOSIS — M79674 Pain in right toe(s): Secondary | ICD-10-CM | POA: Diagnosis not present

## 2017-02-04 DIAGNOSIS — B351 Tinea unguium: Secondary | ICD-10-CM

## 2017-02-04 DIAGNOSIS — M79675 Pain in left toe(s): Secondary | ICD-10-CM | POA: Diagnosis not present

## 2017-02-04 DIAGNOSIS — E1159 Type 2 diabetes mellitus with other circulatory complications: Secondary | ICD-10-CM

## 2017-02-04 NOTE — Progress Notes (Signed)
   Subjective:    Patient ID: [name] [name], female    DOB: April 09, 1960, 57 y.o.   MRN: [id number]  HPI this patient presents the office with chief complaint of [place] thick painful nails.  This patient is brought to the office by one guardian.  She is in a wheelchair.  She does take Pletal and is type II diabetic.  She presents the office today for preve foot care services    Review of Systems     Objective:   Physical Exam General Appearance  Alert, conversant and in no acute stress.  Vascular  Dorsalis pedis and posterior pulses are absent   bilaterally.  Capillary return is elongated.  Bilaterally. Temperature is within normal limits  Bilaterally  Neurologic  Deferred since patient has previous brain trauma.  Nails Thick disfigured discolored nails with subungual debride bilaterally from hallux to fifth toes bilaterally. No evidence of bacterial infection or drainage bilaterally.  Orthopedic  No limitations of motion of motion feet bilaterally.  No crepitus or effusions noted.  No bony pathology or digital deformities noted.  Skin  normotropic skin with no porokeratosis noted bilaterally.  No signs of infections or ulcers noted.          Assessment & Plan:  Onychomycosis  Diabetes with vascular disease.   Debridement of nails.  Patient should only be treated with dremel in future. RTC 3 months. Patient also has a swollen right foot in association with a dermatitis that his been previously treated by the doctor at the facility.   Gardiner Barefoot DPM

## 2017-02-10 DIAGNOSIS — M6281 Muscle weakness (generalized): Secondary | ICD-10-CM | POA: Diagnosis not present

## 2017-02-10 DIAGNOSIS — R2689 Other abnormalities of gait and mobility: Secondary | ICD-10-CM | POA: Diagnosis not present

## 2017-02-21 ENCOUNTER — Encounter: Payer: Self-pay | Admitting: Emergency Medicine

## 2017-02-21 ENCOUNTER — Emergency Department: Payer: Medicare Other

## 2017-02-21 ENCOUNTER — Emergency Department
Admission: EM | Admit: 2017-02-21 | Discharge: 2017-02-21 | Disposition: A | Discharge status: Home / Self Care | Payer: Medicare Other | Attending: Student in an Organized Health Care Education/Training Program | Admitting: Student in an Organized Health Care Education/Training Program

## 2017-02-21 ENCOUNTER — Other Ambulatory Visit: Payer: Self-pay

## 2017-02-21 DIAGNOSIS — J209 Acute bronchitis, unspecified: Secondary | ICD-10-CM | POA: Insufficient documentation

## 2017-02-21 DIAGNOSIS — I10 Essential (primary) hypertension: Secondary | ICD-10-CM | POA: Insufficient documentation

## 2017-02-21 DIAGNOSIS — R05 Cough: Secondary | ICD-10-CM | POA: Diagnosis present

## 2017-02-21 DIAGNOSIS — Z79899 Other long term (current) drug therapy: Secondary | ICD-10-CM | POA: Insufficient documentation

## 2017-02-21 DIAGNOSIS — E119 Type 2 diabetes mellitus without complications: Secondary | ICD-10-CM | POA: Insufficient documentation

## 2017-02-21 DIAGNOSIS — Z87891 Personal history of nicotine dependence: Secondary | ICD-10-CM | POA: Diagnosis not present

## 2017-02-21 DIAGNOSIS — Z7984 Long term (current) use of oral hypoglycemic drugs: Secondary | ICD-10-CM | POA: Insufficient documentation

## 2017-02-21 LAB — INFLUENZA PANEL BY PCR (TYPE A & B)
INFLAPCR: NEGATIVE
INFLBPCR: NEGATIVE

## 2017-02-21 MED ORDER — CEFDINIR 300 MG PO CAPS: 300.0000 mg | ORAL_CAPSULE | Freq: Two times a day (BID) | ORAL | 0 refills | Status: DC

## 2017-02-21 MED ORDER — IPRATROPIUM-ALBUTEROL 0.5-2.5 (3) MG/3ML IN SOLN: 3.0000 mL | RESPIRATORY_TRACT | 0 refills | Status: DC | PRN

## 2017-02-21 MED ORDER — METHYLPREDNISOLONE 4 MG PO TBPK: ORAL_TABLET | ORAL | 0 refills | Status: DC

## 2017-02-21 MED ORDER — IPRATROPIUM-ALBUTEROL 0.5-2.5 (3) MG/3ML IN SOLN
3.0000 mL | Freq: Once | RESPIRATORY_TRACT | Status: AC
  Administered 2017-02-21: 3 mL via RESPIRATORY_TRACT
  Filled 2017-02-21: qty 3

## 2017-02-21 NOTE — ED Provider Notes (Signed)
[facility] Emergency Department Provider Note  ____________________________________________   First MD Initiated Contact with Patient 02/21/17 1248     (approximate)  I have reviewed the triage vital signs and the nursing notes.   HISTORY  Chief Complaint Cough    HPI [name] [name] is a 57 y.o. female is here with her caregiver.  She lives at [facility].  They state that she has had a cough and congestion with questionable low-grade fever for about 3 days.  They are concerned about pneumonia and influenza.  The patient denies any chest pain or shortness of breath.  She denies any vomiting or diarrhea.  Past Medical History:  Diagnosis Date  . Arthritis   . Borderline intellectual functioning   . Diabetes mellitus without complication ([place])   . Double vision   . Hypertension   . Osteoporosis   . 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[place])   . 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 Traumatic brain injury [facility])     Patient Active Problem List   Diagnosis Date Noted  . Sepsis ([place]) 08/15/2016  . UTI (urinary tract infection) 03/06/2016  . Dysphagia 12/11/2015  . Type 2 diabetes mellitus ([place]) 12/03/2014  . Traumatic brain injury ([place]) 12/03/2014  . Dehydration 08/23/2014  . Chronic constipation 08/23/2014  . Clinical depression 08/23/2014  . Diabetes mellitus, type 2 ([place]) 08/23/2014  . BP (high blood pressure) 08/23/2014  . Injury brain, traumatic ([place]) 08/23/2014  . OP (osteoporosis) 08/23/2014  . HLD (hyperlipidemia) 08/23/2014  . Cognitive impairment 08/23/2014    History reviewed. No pertinent surgical history.  Prior to Admission medications   Medication Sig Start Date End Date Taking? Authorizing Provider  ACCU-CHEK AVIVA PLUS test strip CHECK BLOOD SUGAR TWICE A WEEK 09/16/15   Ancil Boozer, Drue Stager, MD  albuterol (PROVENTIL HFA;VENTOLIN HFA) 108 (90 Base) MCG/ACT inhaler Inhale 2 puffs into the lungs every 4 (four) hours as needed for wheezing or  shortness of breath. 10/26/16   Cuthriell, Charline Bills, PA-C  Alcohol Swabs (B-D SINGLE USE SWABS REGULAR) PADS USE AS DIRECTED (CHECK BLOOD SUGAR TWICE A WEEK) 09/16/15   Steele Sizer, MD  alendronate (FOSAMAX) 70 MG tablet Take 1 tablet (70 mg total) by mouth once a week. 02/01/17   Roselee Nova, MD  brompheniramine-pseudoephedrine-DM 30-2-10 MG/5ML syrup Take 10 mLs by mouth 4 (four) times daily as needed. 10/26/16   Cuthriell, Charline Bills, PA-C  calcium-vitamin D (OSCAL WITH D) 500-200 MG-UNIT tablet Take 1 tablet by mouth 2 (two) times daily. 11/03/16   Roselee Nova, MD  cefdinir (OMNICEF) 300 MG capsule Take 1 capsule (300 mg total) by mouth 2 (two) times daily. 02/21/17   Fisher, Linden Dolin, PA-C  cholecalciferol (VITAMIN D) 400 units TABS tablet TAKE ONE TABLET BY MOUTH 2 TIMES A DAY 01/07/16   Roselee Nova, MD  cilostazol (PLETAL) 50 MG tablet Take 1 tablet (50 mg total) by mouth 2 (two) times daily. 02/01/17   Roselee Nova, MD  desmopressin (DDAVP) 0.2 MG tablet Take 1 tablet (200 mcg total) by mouth at bedtime. 02/01/17   Roselee Nova, MD  docusate sodium (STOOL SOFTENER) 100 MG capsule TAKE 1 CAPSULE BY MOUTH TWICE A DAY. (STOOL SOFTNER) (TAKE WITH GLASSWATER) 11/03/16   Roselee Nova, MD  donepezil (ARICEPT) 10 MG tablet TAKE ONE TABLET BY MOUTH EVERY DAY 12/30/16   Roselee Nova, MD  ferrous sulfate 325 (65 FE) MG  tablet Take 1 tablet (325 mg total) by mouth daily. 02/01/17   Roselee Nova, MD  FLUoxetine (PROZAC) 10 MG tablet Take 3 tablets (30 mg total) by mouth every morning. 11/03/16   Roselee Nova, MD  ipratropium-albuterol (DUONEB) 0.5-2.5 (3) MG/3ML SOLN Take 3 mLs by nebulization every 4 (four) hours as needed. For the next 5 days 02/21/17   Caryn Section Linden Dolin, PA-C  ketoconazole (NIZORAL) 2 % cream APPLY TOPICALLY ONCE DAILY 12/23/15   Landis Martins, DPM  lurasidone (LATUDA) 20 MG TABS tablet TAKE ONE TABLET BY MOUTH EACH DAY. 02/01/17   Roselee Nova, MD   memantine (NAMENDA XR) 28 MG CP24 24 hr capsule TAKE ONE CAPSULE BY MOUTH ONCE A DAY. ** DO NOT CRUSH ** 02/01/17   Roselee Nova, MD  metFORMIN (GLUCOPHAGE) 500 MG tablet TAKE ONE TABLET BY MOUTH EVERY DAY WITH BREAKFAST 02/01/17   Roselee Nova, MD  methylPREDNISolone (MEDROL DOSEPAK) 4 MG TBPK tablet Take 6 pills on day one then decrease by 1 pill each day 02/21/17   Versie Starks, PA-C  metoprolol succinate (TOPROL-XL) 25 MG 24 hr tablet Take 1 tablet (25 mg total) by mouth daily. 02/01/17   Roselee Nova, MD  nortriptyline (PAMELOR) 25 MG capsule Take 1 capsule (25 mg total) by mouth at bedtime. 02/01/17   Roselee Nova, MD  nystatin cream (MYCOSTATIN) Apply topically as needed for dry skin. 10/17/14   Ashok Norris, MD  omeprazole (PRILOSEC) 20 MG capsule Take 1 capsule (20 mg total) by mouth daily. 11/03/16   Rochel Brome A, MD  polyethylene glycol powder (GLYCOLAX/MIRALAX) powder Take 255 g by mouth once. 10/17/14   Ashok Norris, MD  predniSONE (DELTASONE) 50 MG tablet Take 1 tablet (50 mg total) by mouth daily with breakfast. Patient not taking: Reported on 12/22/2016 10/26/16   Cuthriell, Charline Bills, PA-C  Probiotic Product (PROBIOTIC-10) CAPS Take 1 capsule by mouth daily. 02/01/17   Roselee Nova, MD  ramipril (ALTACE) 10 MG capsule TAKE 1 CAPSULE BY MOUTH EVERY DAY 09/01/16   Lada, Satira Anis, MD  simvastatin (ZOCOR) 10 MG tablet Take 1 tablet (10 mg total) by mouth at bedtime. 02/01/17   Roselee Nova, MD  sitaGLIPtin (JANUVIA) 100 MG tablet ONE TABLET BY MOUTH ONCE A DAY. (DIABETES) 02/01/17   Roselee Nova, MD  Tea Tree 100 % OIL Apply 1 application topically daily. To nails 11/03/16   Roselee Nova, MD  tolterodine (DETROL LA) 4 MG 24 hr capsule Take 1 capsule (4 mg total) by mouth daily. 02/01/17   Roselee Nova, MD    Allergies Dilantin [phenytoin]; Penicillins; Phenytoin sodium extended; Phenytoin sodium extended; and Sulfa antibiotics  No family  history on file.  Social History Social History   Tobacco Use  . Smoking status: Former Smoker    Types: Cigarettes  . Smokeless tobacco: Never Used  Substance Use Topics  . Alcohol use: No    Alcohol/week: 0.0 oz  . Drug use: No    Review of Systems  Constitutional: Possible fever/chills Eyes: No visual changes. ENT: No sore throat. Respiratory: Positive cough Genitourinary: Negative for dysuria. Musculoskeletal: Negative for back pain. Skin: Negative for rash.    ____________________________________________   PHYSICAL EXAM:  VITAL SIGNS: ED Triage Vitals  Enc Vitals Group     BP 02/21/17 1241 116/85     Pulse Rate 02/21/17 1241  79     Resp 02/21/17 1241 16     Temp 02/21/17 1241 98.8 F (37.1 C)     Temp src --      SpO2 02/21/17 1241 96 %     Weight 02/21/17 1239 153 lb (69.4 kg)     Height --      Head Circumference --      Peak Flow --      Pain Score 02/21/17 1421 0     Pain Loc --      Pain Edu? --      Excl. in [place]? --     Constitutional: Alert and oriented. Well appearing and in no acute distress.  Patient acts appropriately at baseline Eyes: Conjunctivae are normal.  Head: Atraumatic. Nose: No congestion/rhinnorhea. Mouth/Throat: Mucous membranes are moist.   Cardiovascular: Normal rate, regular rhythm.  Heart sounds are normal Respiratory: Normal respiratory effort.  No retractions, lungs with diffuse wheezing bilaterally GU: deferred Musculoskeletal: FROM all extremities, warm and well perfused Neurologic:  Normal speech and language.  Skin:  Skin is warm, dry and intact. No rash noted. Psychiatric: Mood and affect are normal. Speech and behavior are normal.  ____________________________________________   LABS (all labs ordered are listed, but only abnormal results are displayed)  Labs Reviewed  INFLUENZA PANEL BY PCR (TYPE A & B)    ____________________________________________   ____________________________________________  RADIOLOGY  Chest x-ray is negative for pneumonia  ____________________________________________   PROCEDURES  Procedure(s) performed: DuoNeb  Procedures    ____________________________________________   INITIAL IMPRESSION / ASSESSMENT AND PLAN / ED COURSE  Pertinent labs & imaging results that were available during my care of the patient were reviewed by me and considered in my medical decision making (see chart for details).  Patient is a 57 year old female presents to the ED with her caregiver.  Caregiver states she has had a cough for about 3 days.  They concerned about flu and pneumonia.  On physical exam the patient appears well.  She is afebrile.  Her lungs have diffuse wheezing bilaterally.  Chest x-ray is negative for pneumonia.  Flu test is negative for influenza  Diagnosis acute bronchitis.  Patient had increased air movement after the DuoNeb treatment.  She was given a prescription for Omnicef, Medrol dose pack, and a prescription for DuoNeb Nebules as they have a nebulizer machine at the facility.  The instructions were given to the caregiver.  The legal guardian was called and results and treatment plans were discussed.  Both the legal guardian and the caregiver state they understand instructions.  They will comply with recommendations.  She is to follow-up with her regular doctor if not better in 3-5 days.  Patient was discharged in stable condition.     As part of my medical decision making, I reviewed the following data within the [place] History obtained from family, Nursing notes reviewed and incorporated, Labs reviewed , Radiograph reviewed, Notes from prior ED visits and [place] Controlled Substance Database  ____________________________________________   FINAL CLINICAL IMPRESSION(S) / ED DIAGNOSES  Final diagnoses:  Acute bronchitis,  unspecified organism      NEW MEDICATIONS STARTED DURING THIS VISIT:  Discharge Medication List as of 02/21/2017  2:03 PM    START taking these medications   Details  cefdinir (OMNICEF) 300 MG capsule Take 1 capsule (300 mg total) by mouth 2 (two) times daily., Starting Sun 02/21/2017, Print    ipratropium-albuterol (DUONEB) 0.5-2.5 (3) MG/3ML SOLN Take 3 mLs by  nebulization every 4 (four) hours as needed. For the next 5 days, Starting Sun 02/21/2017, Print    methylPREDNISolone (MEDROL DOSEPAK) 4 MG TBPK tablet Take 6 pills on day one then decrease by 1 pill each day, Print         Note:  This document was prepared using Dragon voice recognition software and may include unintentional dictation errors.    Versie Starks, PA-C 02/21/17 1614    Merlyn Lot, MD 02/24/17 [phone]

## 2017-02-21 NOTE — ED Triage Notes (Signed)
Pt to ED with caregiver, Pt has had cough, runny nose, and eyes running x 2 days. Pt denies shortness of breath, chest pain. Pt in NAD at this time.

## 2017-02-21 NOTE — ED Notes (Signed)
See triage note  Per care giver she has had cough w/o fever for about 3 days  Also has had runny nose  Afebrile on arrival

## 2017-02-21 NOTE — Discharge Instructions (Signed)
Follow-up with your regular doctor if you are not better in 3-5 days.  Return to the emergency department if you are worsening.  Use medication as prescribed.

## 2017-02-22 ENCOUNTER — Telehealth: Payer: Self-pay | Admitting: Family Medicine

## 2017-02-22 NOTE — Telephone Encounter (Signed)
According to the notes from the ER, patient has a nebulizer machine at the facility where she resides. please confirm

## 2017-02-22 NOTE — Telephone Encounter (Signed)
Copied from [place] [id number]. Topic: Quick Communication - See Telephone Encounter >> Feb 22, 2017  2:17 PM Burnis Medin, NT wrote: CRM for notification. See Telephone encounter for: [facility] called and patient went to the ED and was diagnosed with Bronchitis and the doctor wrote a prescription for medication to go inside of the nebulizer but didn't write a prescription for the machine. Pt uses [facility], [place] [facility] [place] [phone] (Phone) [phone] (Fax)    02/22/17.

## 2017-02-23 ENCOUNTER — Encounter: Payer: Self-pay | Admitting: Family Medicine

## 2017-02-23 NOTE — Telephone Encounter (Signed)
Prescription for Nebulizer device is ready for pick-up.

## 2017-02-23 NOTE — Telephone Encounter (Signed)
[facility] call back and said patient thought she could use the nebulizer machine at the facility but it belongs to another patient. Patient's care giver wanted to see if she can pick the prescription up from the office and would like a call back if this is possible. [phone]

## 2017-02-24 NOTE — Telephone Encounter (Signed)
[facility] has been notified and will come and pick up the Rx today

## 2017-03-04 ENCOUNTER — Ambulatory Visit (INDEPENDENT_AMBULATORY_CARE_PROVIDER_SITE_OTHER): Payer: Medicare Other | Admitting: Family Medicine

## 2017-03-04 ENCOUNTER — Encounter: Payer: Self-pay | Admitting: Family Medicine

## 2017-03-04 VITALS — BP 118/76 | HR 84 | Temp 97.9°F | Resp 16

## 2017-03-04 DIAGNOSIS — J209 Acute bronchitis, unspecified: Secondary | ICD-10-CM

## 2017-03-04 NOTE — Progress Notes (Signed)
Name: [name] [name]   MRN: [id number]    DOB: Oct 24, 1960   Date:03/04/2017       Progress Note  Subjective  Chief Complaint  Chief Complaint  Patient presents with  . Hospitalization Follow-up    bronchitis     HPI  Pt. Presents for hospital follow up where she was seen in the ER for coughing, flu and pneumonia were negative but pt. Was diagnosed with bronchitis. SHe was discharged on antibiotics, oral steroids and nebulizers. Pt. Presents with her caregiver who reports pt. Is back to baseline, eating and drinking well, no cough or wheezing, has finished a course of antibiotics.    Past Medical History:  Diagnosis Date  . Arthritis   . Borderline intellectual functioning   . Diabetes mellitus without complication ([place])   . Double vision   . Hypertension   . Osteoporosis   . 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[place])   . 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 Traumatic brain injury ([place])     No past surgical history on file.  No family history on file.  Social History   Socioeconomic History  . Marital status: Single    Spouse name: Not on file  . Number of children: 0  . Years of education: Not on file  . Highest education level: Not on file  Social Needs  . Financial resource strain: Not hard at all  . Food insecurity - worry: Never true  . Food insecurity - inability: Never true  . Transportation needs - medical: No  . Transportation needs - non-medical: No  Occupational History  . Not on file  Tobacco Use  . Smoking status: Former Smoker    Types: Cigarettes  . Smokeless tobacco: Never Used  Substance and Sexual Activity  . Alcohol use: No    Alcohol/week: 0.0 oz  . Drug use: No  . Sexual activity: Not Currently  Other Topics Concern  . Not on file  Social History Narrative  . Not on file     Current Outpatient Medications:  .  ACCU-CHEK AVIVA PLUS test strip, CHECK BLOOD SUGAR TWICE A WEEK, Disp: 100 each, Rfl: PRN .  albuterol (PROVENTIL HFA;VENTOLIN HFA) 108 (90  Base) MCG/ACT inhaler, Inhale 2 puffs into the lungs every 4 (four) hours as needed for wheezing or shortness of breath., Disp: 1 Inhaler, Rfl: 0 .  Alcohol Swabs (B-D SINGLE USE SWABS REGULAR) PADS, USE AS DIRECTED (CHECK BLOOD SUGAR TWICE A WEEK), Disp: 100 each, Rfl: PRN .  alendronate (FOSAMAX) 70 MG tablet, Take 1 tablet (70 mg total) by mouth once a week., Disp: 52 tablet, Rfl: 0 .  brompheniramine-pseudoephedrine-DM 30-2-10 MG/5ML syrup, Take 10 mLs by mouth 4 (four) times daily as needed., Disp: 200 mL, Rfl: 0 .  calcium-vitamin D (OSCAL WITH D) 500-200 MG-UNIT tablet, Take 1 tablet by mouth 2 (two) times daily., Disp: 60 tablet, Rfl: 5 .  cefdinir (OMNICEF) 300 MG capsule, Take 1 capsule (300 mg total) by mouth 2 (two) times daily., Disp: 20 capsule, Rfl: 0 .  cholecalciferol (VITAMIN D) 400 units TABS tablet, TAKE ONE TABLET BY MOUTH 2 TIMES A DAY, Disp: 60 tablet, Rfl: 11 .  cilostazol (PLETAL) 50 MG tablet, Take 1 tablet (50 mg total) by mouth 2 (two) times daily., Disp: 60 tablet, Rfl: 2 .  desmopressin (DDAVP) 0.2 MG tablet, Take 1 tablet (200 mcg total) by mouth at bedtime., Disp: 90 tablet, Rfl: 0 .  docusate sodium (STOOL SOFTENER) 100 MG capsule, TAKE  1 CAPSULE BY MOUTH TWICE A DAY. (STOOL SOFTNER) (TAKE WITH GLASSWATER), Disp: 60 capsule, Rfl: 4 .  donepezil (ARICEPT) 10 MG tablet, TAKE ONE TABLET BY MOUTH EVERY DAY, Disp: 30 tablet, Rfl: 0 .  ferrous sulfate 325 (65 FE) MG tablet, Take 1 tablet (325 mg total) by mouth daily., Disp: 30 tablet, Rfl: 2 .  FLUoxetine (PROZAC) 10 MG tablet, Take 3 tablets (30 mg total) by mouth every morning., Disp: 30 tablet, Rfl: 0 .  ipratropium-albuterol (DUONEB) 0.5-2.5 (3) MG/3ML SOLN, Take 3 mLs by nebulization every 4 (four) hours as needed. For the next 5 days, Disp: 360 mL, Rfl: 0 .  ketoconazole (NIZORAL) 2 % cream, APPLY TOPICALLY ONCE DAILY, Disp: 15 g, Rfl: 0 .  lurasidone (LATUDA) 20 MG TABS tablet, TAKE ONE TABLET BY MOUTH EACH DAY.,  Disp: 90 tablet, Rfl: 0 .  memantine (NAMENDA XR) 28 MG CP24 24 hr capsule, TAKE ONE CAPSULE BY MOUTH ONCE A DAY. ** DO NOT CRUSH **, Disp: 90 capsule, Rfl: 0 .  metFORMIN (GLUCOPHAGE) 500 MG tablet, TAKE ONE TABLET BY MOUTH EVERY DAY WITH BREAKFAST, Disp: 90 tablet, Rfl: 0 .  methylPREDNISolone (MEDROL DOSEPAK) 4 MG TBPK tablet, Take 6 pills on day one then decrease by 1 pill each day, Disp: 21 tablet, Rfl: 0 .  metoprolol succinate (TOPROL-XL) 25 MG 24 hr tablet, Take 1 tablet (25 mg total) by mouth daily., Disp: 30 tablet, Rfl: 2 .  nortriptyline (PAMELOR) 25 MG capsule, Take 1 capsule (25 mg total) by mouth at bedtime., Disp: 90 capsule, Rfl: 0 .  nystatin cream (MYCOSTATIN), Apply topically as needed for dry skin., Disp: 120 g, Rfl: 0 .  omeprazole (PRILOSEC) 20 MG capsule, Take 1 capsule (20 mg total) by mouth daily., Disp: 90 capsule, Rfl: 1 .  polyethylene glycol powder (GLYCOLAX/MIRALAX) powder, Take 255 g by mouth once., Disp: 3350 g, Rfl: 1 .  predniSONE (DELTASONE) 50 MG tablet, Take 1 tablet (50 mg total) by mouth daily with breakfast. (Patient not taking: Reported on 12/22/2016), Disp: 5 tablet, Rfl: 0 .  Probiotic Product (PROBIOTIC-10) CAPS, Take 1 capsule by mouth daily., Disp: 30 capsule, Rfl: 2 .  ramipril (ALTACE) 10 MG capsule, TAKE 1 CAPSULE BY MOUTH EVERY DAY, Disp: 7 capsule, Rfl: 0 .  simvastatin (ZOCOR) 10 MG tablet, Take 1 tablet (10 mg total) by mouth at bedtime., Disp: 90 tablet, Rfl: 0 .  sitaGLIPtin (JANUVIA) 100 MG tablet, ONE TABLET BY MOUTH ONCE A DAY. (DIABETES), Disp: 90 tablet, Rfl: 0 .  Tea Tree 100 % OIL, Apply 1 application topically daily. To nails, Disp: 60 mL, Rfl: 3 .  tolterodine (DETROL LA) 4 MG 24 hr capsule, Take 1 capsule (4 mg total) by mouth daily., Disp: 30 capsule, Rfl: 2  Allergies  Allergen Reactions  . Dilantin [Phenytoin] Other (See Comments)    unknown  . Penicillins Other (See Comments)    .Has patient had a PCN reaction causing  immediate rash, facial/tongue/throat swelling, SOB or lightheadedness with hypotension: Unknown Has patient had a PCN reaction causing severe rash involving mucus membranes or skin necrosis: Unknown Has patient had a PCN reaction that required hospitalization: Unknown Has patient had a PCN reaction occurring within the last 10 years: Unknown If all of the above answers are "NO", then may proceed with Cephalosporin use.   Marland Kitchen Phenytoin Sodium Extended Other (See Comments)    Other reaction(s): Other (See Comments)  . Phenytoin Sodium Extended Other (See Comments)  Other reaction(s): Other (See Comments)  . Sulfa Antibiotics Rash and Other (See Comments)    unknown     ROS  Please  See HPI for complete discussion of ROS.   Objective  Vitals:   03/04/17 1017  BP: 118/76  Pulse: 84  Resp: 16  Temp: 97.9 F (36.6 C)  TempSrc: Oral  SpO2: 94%    Physical Exam  Constitutional: She is well-developed, well-nourished, and in no distress.  Cardiovascular: Normal rate, regular rhythm and normal heart sounds.  No murmur heard. Pulmonary/Chest: Effort normal and breath sounds normal.  End expiratory wheeze in both lung fields.  Abdominal: Soft. Bowel sounds are normal.  Neurological: She is alert.  Psychiatric: Mood, affect and judgment normal.  Nursing note and vitals reviewed.      Recent Results (from the past 2160 hour(s))  Hepatitis C antibody screen     Status: None   Collection Time: 12/22/16  9:58 AM  Result Value Ref Range   Hepatitis C Ab NON-REACTIVE NON-REACTI   SIGNAL TO CUT-OFF 0.05 <1.00  Influenza panel by PCR (type A & B)     Status: None   Collection Time: 02/21/17  1:03 PM  Result Value Ref Range   Influenza A By PCR NEGATIVE NEGATIVE   Influenza B By PCR NEGATIVE NEGATIVE    Comment: (NOTE) The Xpert Xpress Flu assay is intended as an aid in the diagnosis of  influenza and should not be used as a sole basis for treatment.  This  assay is FDA approved  for nasopharyngeal swab specimens only. Nasal  washings and aspirates are unacceptable for Xpert Xpress Flu testing. Performed at [facility], [address] [place], [place] [address]      Assessment & Plan  1. Acute bronchitis, unspecified organism Appears to have resolved, normal oxygenataion, finished antibiotics. Reassured. Completed paperwork from the group home.   Sruti Ayllon Asad [facility] 03/04/2017 10:27 AM

## 2017-03-24 ENCOUNTER — Encounter: Payer: Self-pay | Admitting: *Deleted

## 2017-03-25 ENCOUNTER — Other Ambulatory Visit: Payer: Self-pay

## 2017-03-25 ENCOUNTER — Ambulatory Visit
Admission: RE | Admit: 2017-03-25 | Discharge: 2017-03-25 | Disposition: A | Discharge status: Home / Self Care | Payer: Medicare Other | Source: Ambulatory Visit | Attending: Gastroenterology | Admitting: Gastroenterology

## 2017-03-25 ENCOUNTER — Ambulatory Visit: Payer: Medicare Other | Admitting: Certified Registered Nurse Anesthetist

## 2017-03-25 ENCOUNTER — Encounter
Admission: RE | Disposition: A | Discharge status: Home / Self Care | Payer: Self-pay | Source: Ambulatory Visit | Attending: Gastroenterology

## 2017-03-25 ENCOUNTER — Encounter: Payer: Self-pay | Admitting: Certified Registered Nurse Anesthetist

## 2017-03-25 DIAGNOSIS — F329 Major depressive disorder, single episode, unspecified: Secondary | ICD-10-CM | POA: Diagnosis not present

## 2017-03-25 DIAGNOSIS — Z87820 Personal history of traumatic brain injury: Secondary | ICD-10-CM | POA: Insufficient documentation

## 2017-03-25 DIAGNOSIS — G8250 Quadriplegia, unspecified: Secondary | ICD-10-CM | POA: Diagnosis not present

## 2017-03-25 DIAGNOSIS — K2950 Unspecified chronic gastritis without bleeding: Secondary | ICD-10-CM | POA: Insufficient documentation

## 2017-03-25 DIAGNOSIS — Z7984 Long term (current) use of oral hypoglycemic drugs: Secondary | ICD-10-CM | POA: Insufficient documentation

## 2017-03-25 DIAGNOSIS — K449 Diaphragmatic hernia without obstruction or gangrene: Secondary | ICD-10-CM | POA: Insufficient documentation

## 2017-03-25 DIAGNOSIS — I10 Essential (primary) hypertension: Secondary | ICD-10-CM | POA: Insufficient documentation

## 2017-03-25 DIAGNOSIS — Z79899 Other long term (current) drug therapy: Secondary | ICD-10-CM | POA: Diagnosis not present

## 2017-03-25 DIAGNOSIS — M810 Age-related osteoporosis without current pathological fracture: Secondary | ICD-10-CM | POA: Insufficient documentation

## 2017-03-25 DIAGNOSIS — M1990 Unspecified osteoarthritis, unspecified site: Secondary | ICD-10-CM | POA: Insufficient documentation

## 2017-03-25 DIAGNOSIS — K3189 Other diseases of stomach and duodenum: Secondary | ICD-10-CM | POA: Insufficient documentation

## 2017-03-25 DIAGNOSIS — K2270 Barrett's esophagus without dysplasia: Secondary | ICD-10-CM | POA: Diagnosis not present

## 2017-03-25 DIAGNOSIS — Z87891 Personal history of nicotine dependence: Secondary | ICD-10-CM | POA: Diagnosis not present

## 2017-03-25 DIAGNOSIS — Z888 Allergy status to other drugs, medicaments and biological substances status: Secondary | ICD-10-CM | POA: Insufficient documentation

## 2017-03-25 DIAGNOSIS — Z882 Allergy status to sulfonamides status: Secondary | ICD-10-CM | POA: Diagnosis not present

## 2017-03-25 DIAGNOSIS — E119 Type 2 diabetes mellitus without complications: Secondary | ICD-10-CM | POA: Diagnosis not present

## 2017-03-25 DIAGNOSIS — E785 Hyperlipidemia, unspecified: Secondary | ICD-10-CM | POA: Diagnosis not present

## 2017-03-25 DIAGNOSIS — Z880 Allergy status to penicillin: Secondary | ICD-10-CM | POA: Diagnosis not present

## 2017-03-25 DIAGNOSIS — K317 Polyp of stomach and duodenum: Secondary | ICD-10-CM | POA: Diagnosis not present

## 2017-03-25 DIAGNOSIS — K2980 Duodenitis without bleeding: Secondary | ICD-10-CM | POA: Diagnosis not present

## 2017-03-25 HISTORY — DX: Unspecified intracranial injury with loss of consciousness of unspecified duration, initial encounter: S06.9X9A

## 2017-03-25 HISTORY — DX: Hyperlipidemia, unspecified: E78.5

## 2017-03-25 HISTORY — DX: Unspecified intracranial injury with loss of consciousness status unknown, initial encounter: S06.9XAA

## 2017-03-25 HISTORY — PX: ESOPHAGOGASTRODUODENOSCOPY (EGD) WITH PROPOFOL: SHX5813

## 2017-03-25 HISTORY — DX: Depression, unspecified: F32.A

## 2017-03-25 HISTORY — DX: Other constipation: K59.09

## 2017-03-25 HISTORY — DX: Major depressive disorder, single episode, unspecified: F32.9

## 2017-03-25 LAB — GLUCOSE, CAPILLARY
GLUCOSE-CAPILLARY: 69 mg/dL (ref 65–99)
GLUCOSE-CAPILLARY: 96 mg/dL (ref 65–99)
GLUCOSE-CAPILLARY: 75 mg/dL (ref 65–99)

## 2017-03-25 SURGERY — ESOPHAGOGASTRODUODENOSCOPY (EGD) WITH PROPOFOL
Anesthesia: General

## 2017-03-25 MED ORDER — PROPOFOL 500 MG/50ML IV EMUL
INTRAVENOUS | Status: AC
  Filled 2017-03-25: qty 50

## 2017-03-25 MED ORDER — SODIUM CHLORIDE 0.9 % IV SOLN
INTRAVENOUS | Status: DC
  Administered 2017-03-25: 10:00:00 via INTRAVENOUS

## 2017-03-25 MED ORDER — SODIUM CHLORIDE 0.9 % IV SOLN
INTRAVENOUS | Status: DC
  Administered 2017-03-25: 09:00:00 via INTRAVENOUS

## 2017-03-25 MED ORDER — PROPOFOL 500 MG/50ML IV EMUL
INTRAVENOUS | Status: DC | PRN
  Administered 2017-03-25: 140 ug/kg/min via INTRAVENOUS

## 2017-03-25 MED ORDER — LIDOCAINE HCL (PF) 2 % IJ SOLN
INTRAMUSCULAR | Status: AC
  Filled 2017-03-25: qty 10

## 2017-03-25 MED ORDER — PROPOFOL 10 MG/ML IV BOLUS
INTRAVENOUS | Status: DC | PRN
Start: 2017-03-25 — End: 2017-03-25
  Administered 2017-03-25: 100 mg via INTRAVENOUS

## 2017-03-25 MED ORDER — LIDOCAINE HCL (PF) 1 % IJ SOLN
INTRAMUSCULAR | Status: AC
  Filled 2017-03-25: qty 2

## 2017-03-25 MED ORDER — LIDOCAINE HCL (CARDIAC) 20 MG/ML IV SOLN
INTRAVENOUS | Status: DC | PRN
  Administered 2017-03-25: 50 mg via INTRAVENOUS

## 2017-03-25 MED ORDER — DEXTROSE 50 % IV SOLN
INTRAVENOUS | Status: AC
  Administered 2017-03-25: 12.5 mL
  Filled 2017-03-25: qty 50

## 2017-03-25 NOTE — Op Note (Signed)
[facility] Patient Name: [name] [name] Procedure Date: 03/25/2017 10:00 AM MRN: [id number] Account #: [id number] Date of Birth: Feb 19, 1960 Admit Type: Outpatient Age: 57 Room: [facility] ENDO ROOM 1 Gender: Female Note Status: Finalized Procedure:            Upper GI endoscopy Indications:          Follow-up of Barrett's esophagus Providers:            Lollie Sails, MD Referring MD:         Bethena Roys. Sowles, MD (Referring MD) Medicines:            Monitored Anesthesia Care Complications:        No immediate complications. Procedure:            Pre-Anesthesia Assessment:                       - ASA Grade Assessment: III - A patient with severe                        systemic disease.                       After obtaining informed consent, the endoscope was                        passed under direct vision. Throughout the procedure,                        the patient's blood pressure, pulse, and oxygen                        saturations were monitored continuously. The Endoscope                        was introduced through the mouth, and advanced to the                        third part of duodenum. The patient tolerated the                        procedure well. Findings:      There were esophageal mucosal changes consistent with short-segment       Barrett's esophagus present in the distal esophagus. The maximum       longitudinal extent of these mucosal changes was 3 cm in length. Mucosa       was biopsied with a cold forceps for histology in 4 quadrants at       intervals of 2 cm at 30 and 32 cm from the incisors. A total of 2       specimen bottles were sent to pathology.      Patchy mild inflammation characterized by erythema was found in the       gastric body. Biopsies were taken with a cold forceps for histology.       Biopsies were taken with a cold forceps for Helicobacter pylori testing.      A small hiatal hernia was found. The  Z-line was a variable distance from       incisors; the hiatal hernia was sliding.      The exam of the stomach was otherwise normal.      A  single 5 mm sessile polyp with no bleeding was found in the duodenal       bulb. There is a small spot of mucosa on the top of the polyp, which       whebn observed is noted to be a small opening, possibly auxillary bile       duct. Biopsies were taken with a cold forceps for histology.      The exam of the duodenum was otherwise normal. Impression:           - Esophageal mucosal changes consistent with                        short-segment Barrett's esophagus. Biopsied.                       - Gastritis. Biopsied.                       - Small hiatal hernia.                       - A single duodenal polyp. Biopsied. If pathology is                        consistant with adenoma, will need to do EUS due to                        association with possible auxillary bile duct. Recommendation:       - Await pathology results.                       - Continue present medications.                       If pathology of duodenal polyp is consistant with                        adenoma, will need to do EUS due to association with                        possible auxillary bile duct.Your physician may                        recommended an upper endoscopic ultrasound (UEUS) after                        studies are complete. Lollie Sails, MD 03/25/2017 10:36:57 AM This report has been signed electronically. Number of Addenda: 0 Note Initiated On: 03/25/2017 10:00 AM      [facility]
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
## 2017-03-25 NOTE — Anesthesia Postprocedure Evaluation (Signed)
Anesthesia Post Note  Patient: [name] [name]  Procedure(s) Performed: ESOPHAGOGASTRODUODENOSCOPY (EGD) WITH PROPOFOL (N/A )  Patient location during evaluation: PACU Anesthesia Type: General Level of consciousness: awake and alert Pain management: pain level controlled Vital Signs Assessment: post-procedure vital signs reviewed and stable Respiratory status: spontaneous breathing, nonlabored ventilation, respiratory function stable and patient connected to nasal cannula oxygen Cardiovascular status: blood pressure returned to baseline and stable Postop Assessment: no apparent nausea or vomiting Anesthetic complications: no     Last Vitals:  Vitals:   03/25/17 0856 03/25/17 1029  BP: (!) 154/92 110/73  Pulse: 81 84  Resp: 18 10  Temp: (!) 36.2 C (!) 36.1 C  SpO2: 100% 96%    Last Pain:  Vitals:   03/25/17 1029  TempSrc: Tympanic                 Molli Barrows

## 2017-03-25 NOTE — Transfer of Care (Signed)
Immediate Anesthesia Transfer of Care Note  Patient: [name] [name]  Procedure(s) Performed: ESOPHAGOGASTRODUODENOSCOPY (EGD) WITH PROPOFOL (N/A )  Patient Location: PACU and Endoscopy Unit  Anesthesia Type:General  Level of Consciousness: drowsy  Airway & Oxygen Therapy: Patient Spontanous Breathing and Patient connected to nasal cannula oxygen  Post-op Assessment: Report given to RN and Post -op Vital signs reviewed and stable  Post vital signs: Reviewed and stable  Last Vitals:  Vitals:   03/25/17 0856 03/25/17 1029  BP: (!) 154/92 110/73  Pulse: 81 84  Resp: 18 10  Temp: (!) 36.2 C (!) 36.1 C  SpO2: 100% 96%    Last Pain:  Vitals:   03/25/17 1029  TempSrc: Tympanic         Complications: No apparent anesthesia complications

## 2017-03-25 NOTE — Anesthesia Post-op Follow-up Note (Signed)
Anesthesia QCDR form completed.        
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
## 2017-03-26 ENCOUNTER — Telehealth: Payer: Self-pay | Admitting: Family Medicine

## 2017-03-26 ENCOUNTER — Encounter: Payer: Self-pay | Admitting: Gastroenterology

## 2017-03-26 LAB — SURGICAL PATHOLOGY

## 2017-03-26 NOTE — Telephone Encounter (Signed)
Please call patient to schedule regular follow up with new PCP Dr. Sanda Klein.

## 2017-03-29 IMAGING — RF DG SWALLOWING FUNCTION
13 of 24 series · 13 of 24 positions shown · non-contrast
Comparison: None.

CLINICAL DATA: Aspiration of Food

EXAM:
MODIFIED BARIUM SWALLOW
TECHNIQUE: Different consistencies of barium were administered orally to the
patient by the Speech Pathologist. Imaging of the pharynx was
performed in the lateral projection.
FLUOROSCOPY TIME:  Fluoroscopy Time:  2 minutes 24 seconds
Number of Acquired Images:  Cine images

[Series 3: nectar thick -  spoon · 1 of 66 frames shown (1 of 2)]
[frame 10/66]
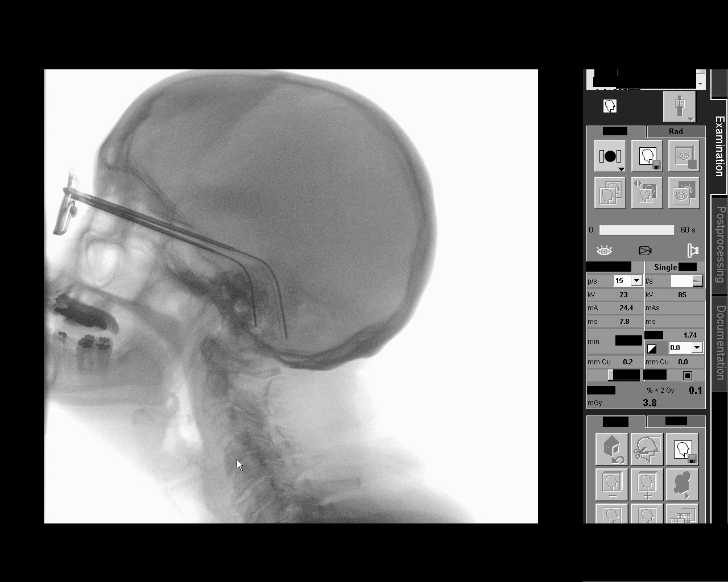

[Series 5: nectar thick -  spoon · 1 of 66 frames shown (2 of 2)]
[frame 10/66]
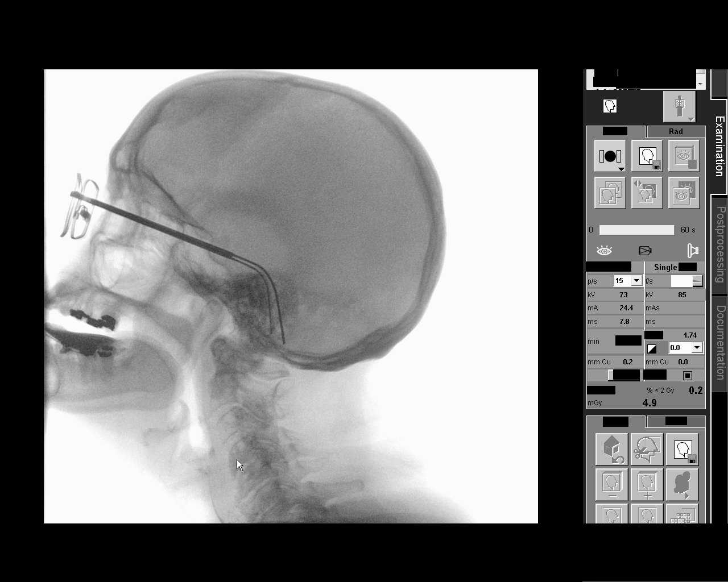

[Series 7: run · 1 of 51 frames shown (1 of 4)]
[frame 8/51]
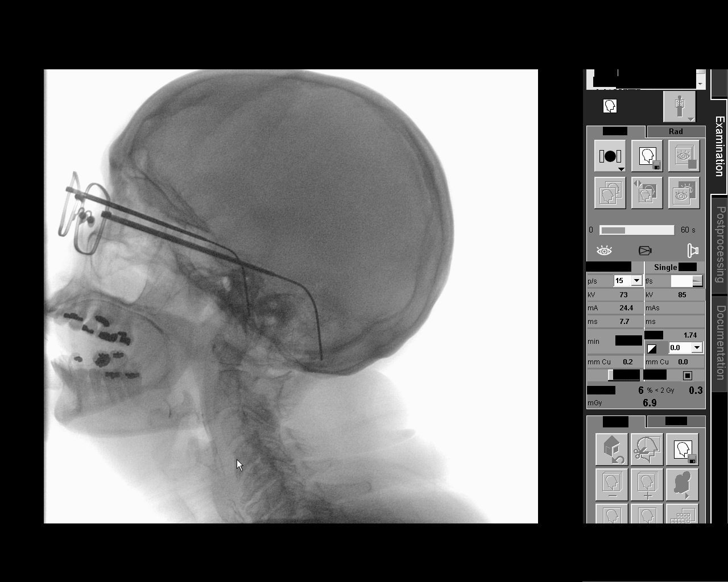

[Series 9: run · 1 of 71 frames shown (2 of 4)]
[frame 36/71]
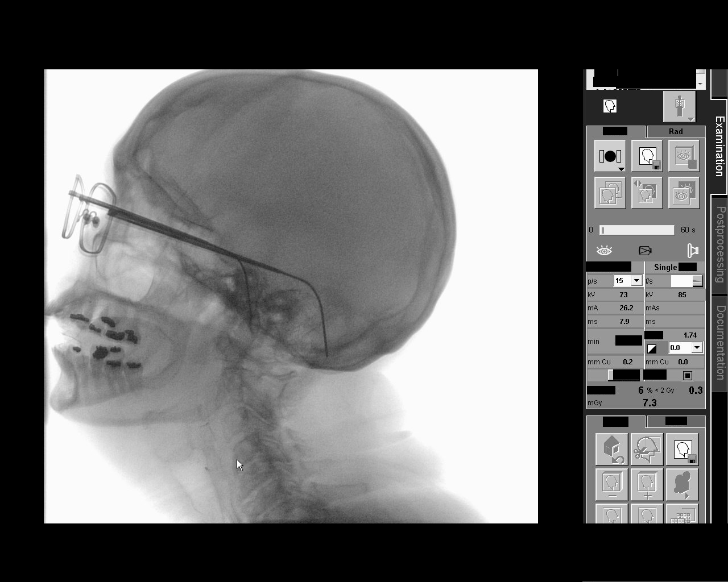

[Series 11: honey thick - spoon · 1 of 56 frames shown (1 of 2)]
[frame 48/56]
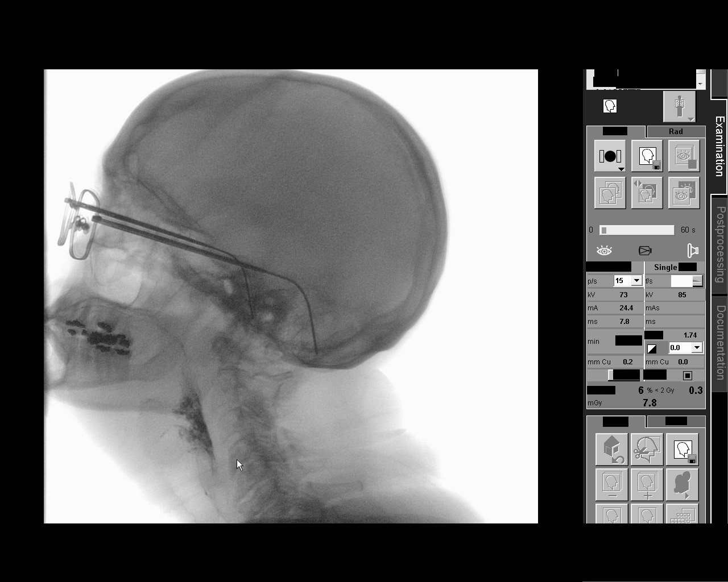

[Series 13: honey thick - cup(amount monitored by sl · 1 of 100 frames shown (1 of 2)]
[frame 52/100]
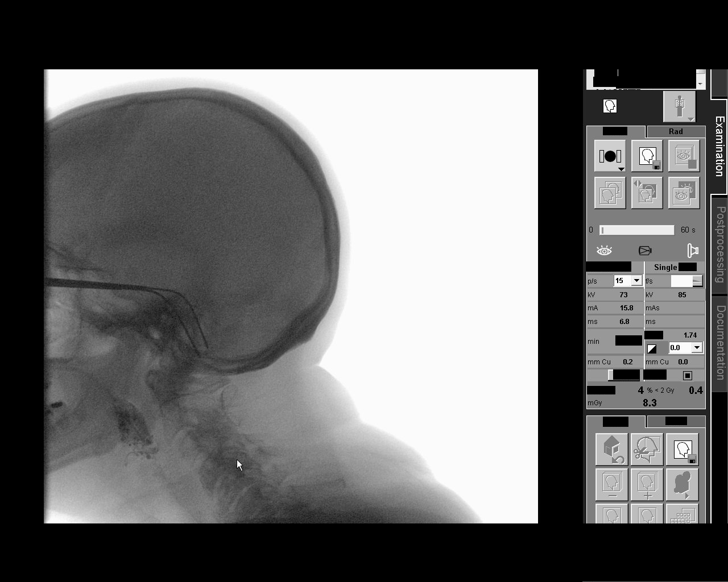

[Series 15: honey thick - cup(amount monitored by sl · 1 of 55 frames shown (2 of 2)]
[frame 36/55]
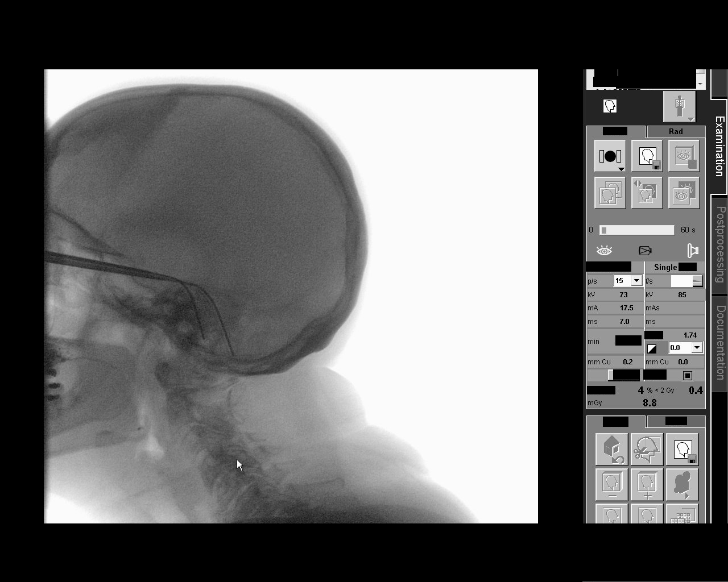

[Series 16: run · 1 of 66 frames shown (3 of 4)]
[frame 57/66]
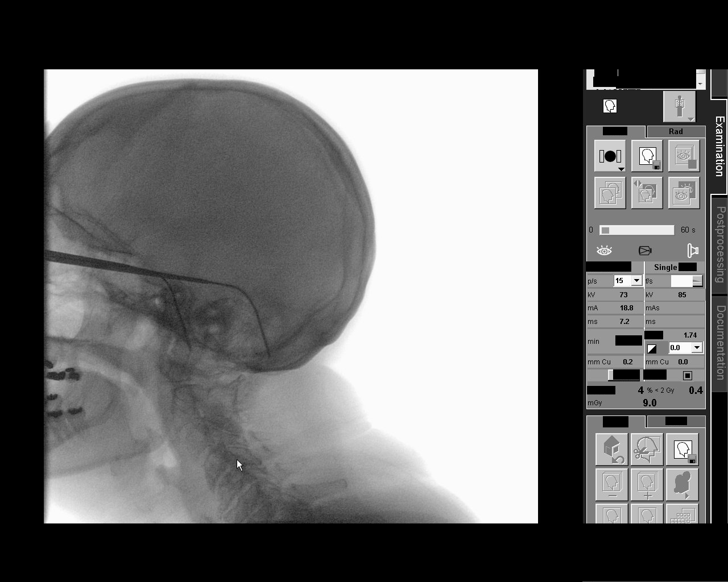

[Series 18: puree · 1 of 191 frames shown (1 of 2)]
[frame 96/191]
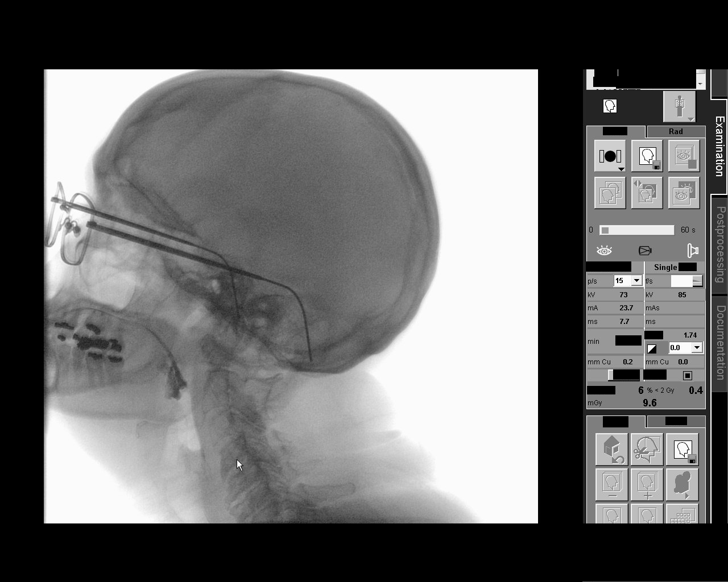

[Series 20: f/u swallow to clear puree residue · 1 of 48 frames shown]
[frame 25/48]
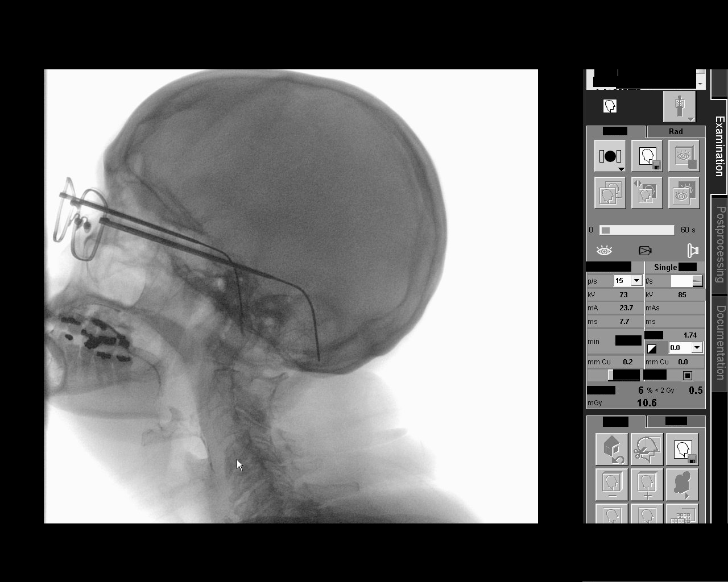

[Series 22: puree · 1 of 18 frames shown (2 of 2)]
[frame 18/18]
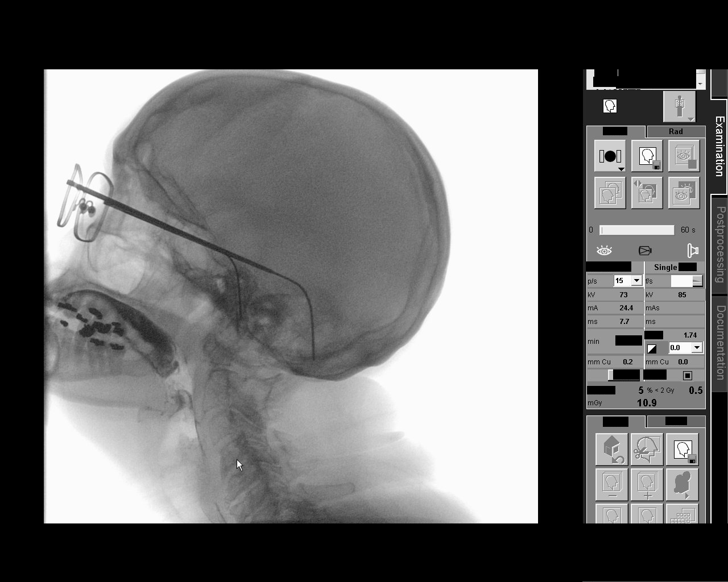

[Series 24: run · 1 of 38 frames shown (4 of 4)]
[frame 33/38]
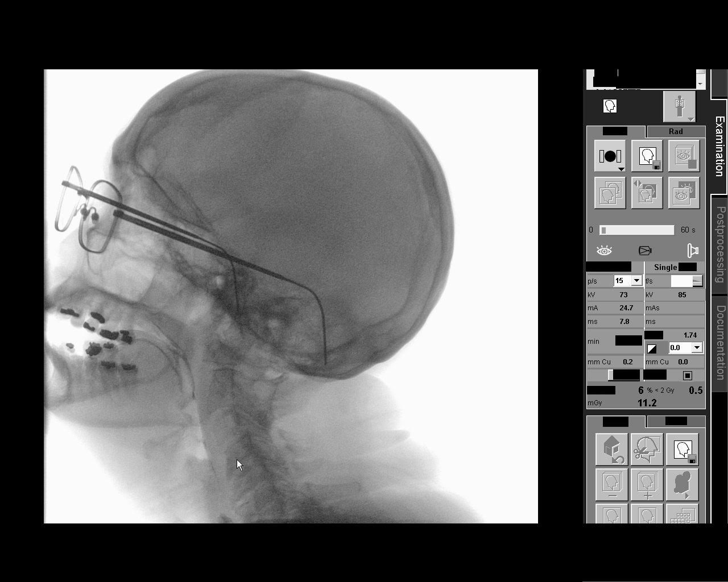

[Series 26: honey thick - spoon · 1 of 88 frames shown (2 of 2)]
[frame 75/88]
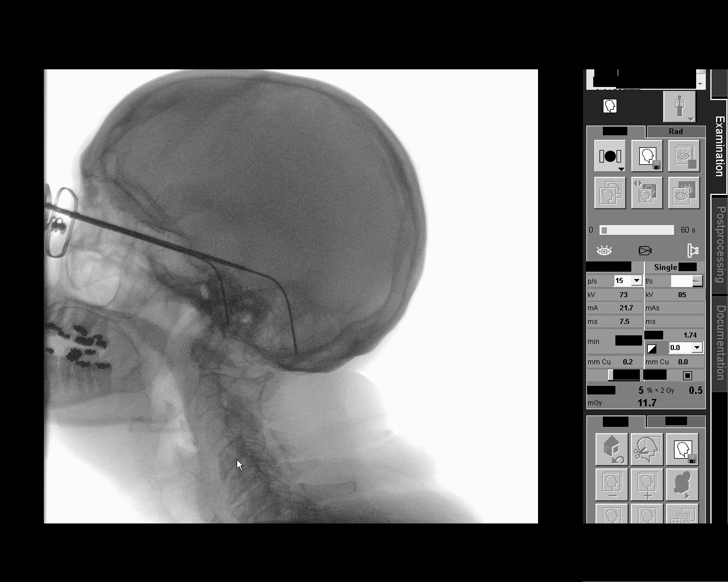

[13 of 24 positions shown; findings below may reference images not displayed]

FINDINGS: Nectar thick liquid- trace penetration, otherwise normal.

Honey- trace penetration, otherwise normal.

[place]?Bily within normal limits

Cracker-within normal limits
IMPRESSION: Trace penetration with nectar and honey, otherwise negative exam.

Please refer to the Speech Pathologists report for complete details
and recommendations.

## 2017-03-29 NOTE — Telephone Encounter (Signed)
#   busy times 3

## 2017-04-01 ENCOUNTER — Other Ambulatory Visit: Payer: Self-pay

## 2017-04-01 DIAGNOSIS — E119 Type 2 diabetes mellitus without complications: Secondary | ICD-10-CM

## 2017-04-01 NOTE — Telephone Encounter (Signed)
Request for diabetes medication. Metformin and Januvia to [facility].   Last office visit pertaining to diabetes: 03/04/2017  Lab Results  Component Value Date   HGBA1C 5.8 10/19/2016      No Follow-up on file.

## 2017-04-08 ENCOUNTER — Ambulatory Visit (INDEPENDENT_AMBULATORY_CARE_PROVIDER_SITE_OTHER): Payer: Medicare Other | Admitting: Family Medicine

## 2017-04-08 ENCOUNTER — Encounter: Payer: Self-pay | Admitting: Family Medicine

## 2017-04-08 VITALS — BP 120/80 | HR 93 | Resp 16 | Ht 66.0 in

## 2017-04-08 DIAGNOSIS — R809 Proteinuria, unspecified: Secondary | ICD-10-CM | POA: Diagnosis not present

## 2017-04-08 DIAGNOSIS — D692 Other nonthrombocytopenic purpura: Secondary | ICD-10-CM | POA: Diagnosis not present

## 2017-04-08 DIAGNOSIS — I739 Peripheral vascular disease, unspecified: Secondary | ICD-10-CM | POA: Insufficient documentation

## 2017-04-08 DIAGNOSIS — E1129 Type 2 diabetes mellitus with other diabetic kidney complication: Secondary | ICD-10-CM

## 2017-04-08 DIAGNOSIS — S069X9S Unspecified intracranial injury with loss of consciousness of unspecified duration, sequela: Secondary | ICD-10-CM

## 2017-04-08 DIAGNOSIS — G8250 Quadriplegia, unspecified: Secondary | ICD-10-CM | POA: Insufficient documentation

## 2017-04-08 DIAGNOSIS — F339 Major depressive disorder, recurrent, unspecified: Secondary | ICD-10-CM | POA: Diagnosis not present

## 2017-04-08 DIAGNOSIS — K2270 Barrett's esophagus without dysplasia: Secondary | ICD-10-CM

## 2017-04-08 DIAGNOSIS — I10 Essential (primary) hypertension: Secondary | ICD-10-CM | POA: Insufficient documentation

## 2017-04-08 LAB — POCT GLYCOSYLATED HEMOGLOBIN (HGB A1C): Hemoglobin A1C: 6.5

## 2017-04-08 MED ORDER — ASPIRIN EC 81 MG PO TBEC: 81.0000 mg | DELAYED_RELEASE_TABLET | Freq: Every day | ORAL | 11 refills | Status: DC

## 2017-04-08 MED ORDER — METFORMIN HCL 850 MG PO TABS: 850.0000 mg | ORAL_TABLET | Freq: Two times a day (BID) | ORAL | 5 refills | Status: DC
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
## 2017-04-12 DIAGNOSIS — E1129 Type 2 diabetes mellitus with other diabetic kidney complication: Secondary | ICD-10-CM | POA: Diagnosis not present

## 2017-04-12 DIAGNOSIS — R809 Proteinuria, unspecified: Secondary | ICD-10-CM | POA: Diagnosis not present

## 2017-04-13 LAB — MICROALBUMIN / CREATININE URINE RATIO
Creatinine, Urine: 205 mg/dL (ref 20–275)
Microalb Creat Ratio: 4 mcg/mg creat (ref <30)
Microalb, Ur: 0.9 mg/dL

## 2017-04-27 DIAGNOSIS — B356 Tinea cruris: Secondary | ICD-10-CM | POA: Diagnosis not present

## 2017-04-29 ENCOUNTER — Other Ambulatory Visit: Payer: Self-pay

## 2017-04-29 DIAGNOSIS — E1129 Type 2 diabetes mellitus with other diabetic kidney complication: Secondary | ICD-10-CM

## 2017-04-29 DIAGNOSIS — R809 Proteinuria, unspecified: Principal | ICD-10-CM

## 2017-04-29 NOTE — Telephone Encounter (Signed)
[facility] day prescription [facility] of a 30 day one. Please sent new prescription.

## 2017-04-30 MED ORDER — METFORMIN HCL 850 MG PO TABS: 850.0000 mg | ORAL_TABLET | Freq: Two times a day (BID) | ORAL | 1 refills | Status: DC

## 2017-05-03 ENCOUNTER — Encounter: Payer: Self-pay | Admitting: Nurse Practitioner

## 2017-05-03 ENCOUNTER — Ambulatory Visit (INDEPENDENT_AMBULATORY_CARE_PROVIDER_SITE_OTHER): Payer: Medicare Other | Admitting: Nurse Practitioner

## 2017-05-03 VITALS — BP 130/82 | HR 84 | Temp 98.0°F | Resp 16

## 2017-05-03 DIAGNOSIS — G8250 Quadriplegia, unspecified: Secondary | ICD-10-CM | POA: Diagnosis not present

## 2017-05-03 DIAGNOSIS — S069X9S Unspecified intracranial injury with loss of consciousness of unspecified duration, sequela: Secondary | ICD-10-CM | POA: Diagnosis not present

## 2017-05-03 DIAGNOSIS — R4189 Other symptoms and signs involving cognitive functions and awareness: Secondary | ICD-10-CM

## 2017-05-03 DIAGNOSIS — B356 Tinea cruris: Secondary | ICD-10-CM | POA: Diagnosis not present

## 2017-05-03 DIAGNOSIS — R062 Wheezing: Secondary | ICD-10-CM | POA: Diagnosis not present

## 2017-05-03 DIAGNOSIS — K2270 Barrett's esophagus without dysplasia: Secondary | ICD-10-CM | POA: Diagnosis not present

## 2017-05-03 MED ORDER — CLOTRIMAZOLE 1 % EX CREA: 1.0000 "application " | TOPICAL_CREAM | Freq: Two times a day (BID) | CUTANEOUS | 0 refills | Status: DC

## 2017-05-03 NOTE — Patient Instructions (Addendum)
-   Continue cream twice a day, keep area clean and dry - Rotate positions every 2 hours to prevent skin break down - Will give order for hospital bed to decrease aspiration risk  - Please follow up with dentist for bad breath
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
## 2017-05-05 ENCOUNTER — Telehealth: Payer: Self-pay | Admitting: Family Medicine

## 2017-05-05 NOTE — Telephone Encounter (Signed)
Copied from [place] [id number]. Topic: Quick Communication - See Telephone Encounter >> May 05, 2017  2:08 PM Ivar Drape wrote: CRM for notification. See Telephone encounter for: 05/05/17. Dee w/[facility] in [place] stated that they need office notes to support the medical need for a hospital bed.  Please fax them to: [phone]

## 2017-05-05 NOTE — Telephone Encounter (Signed)
Notes have been faxed today.

## 2017-05-06 ENCOUNTER — Ambulatory Visit (INDEPENDENT_AMBULATORY_CARE_PROVIDER_SITE_OTHER): Payer: Medicare Other | Admitting: Podiatry

## 2017-05-06 ENCOUNTER — Encounter: Payer: Self-pay | Admitting: Podiatry

## 2017-05-06 DIAGNOSIS — B351 Tinea unguium: Secondary | ICD-10-CM

## 2017-05-06 DIAGNOSIS — M79675 Pain in left toe(s): Secondary | ICD-10-CM

## 2017-05-06 DIAGNOSIS — M79674 Pain in right toe(s): Secondary | ICD-10-CM

## 2017-05-06 DIAGNOSIS — E1159 Type 2 diabetes mellitus with other circulatory complications: Secondary | ICD-10-CM

## 2017-05-06 DIAGNOSIS — I739 Peripheral vascular disease, unspecified: Secondary | ICD-10-CM

## 2017-05-06 NOTE — Progress Notes (Signed)
Complaint:  Visit Type: Patient returns to my office for continued preventative foot care services. Complaint: Patient states" my nails have grown Maves and thick and become painful to walk and wear shoes" Patient has been diagnosed with DM with no foot complications. The patient presents for preventative foot care services. No changes to ROS  Podiatric Exam: Vascular: dorsalis pedis and posterior tibial pulses are not  palpable bilateral. Capillary return is immediate. Temperature gradient is WNL. Skin turgor WNL  Sensorium: Normal Semmes Weinstein monofilament test. Normal tactile sensation bilaterally. Nail Exam: Pt has thick disfigured discolored nails with subungual debris noted bilateral entire nail hallux through fifth toenails Ulcer Exam: There is no evidence of ulcer or pre-ulcerative changes or infection. Orthopedic Exam: Muscle tone and strength are WNL. No limitations in general ROM. No crepitus or effusions noted. Foot type and digits show no abnormalities. Bony prominences are unremarkable. Skin: No Porokeratosis. No infection or ulcers  Diagnosis:  Onychomycosis, , Pain in right toe, pain in left toes  Treatment & Plan Procedures and Treatment: Consent by patient was obtained for treatment procedures.   Debridement of mycotic and hypertrophic toenails, 1 through 5 bilateral and clearing of subungual debris. No ulceration, no infection noted. Patient had difficulty with dremel tool since she has history of brain trauma. Return Visit-Office Procedure: Patient instructed to return to the office for a follow up visit 3 months for continued evaluation and treatment.    Sheritha Louis DPM 

## 2017-05-18 ENCOUNTER — Telehealth: Payer: Self-pay

## 2017-05-18 NOTE — Telephone Encounter (Signed)
Copied from [place] [id number]. Topic: General - Other >> May 17, 2017  4:17 PM Carolyn Stare wrote:  [facility] with Merlene Morse call to ask for OT to evaul and treat.   Req written orders     fax to [phone]    [phone]  [phone]

## 2017-05-19 NOTE — Telephone Encounter (Signed)
done

## 2017-05-25 ENCOUNTER — Other Ambulatory Visit: Payer: Self-pay | Admitting: Family Medicine

## 2017-05-25 DIAGNOSIS — L22 Diaper dermatitis: Secondary | ICD-10-CM

## 2017-05-25 NOTE — Telephone Encounter (Signed)
Refill request for general medication. Nystatin to [facility]   Last office visit 04/08/2017  Follow up on 07/05/2017

## 2017-06-01 DIAGNOSIS — F339 Major depressive disorder, recurrent, unspecified: Secondary | ICD-10-CM | POA: Diagnosis not present

## 2017-06-09 DIAGNOSIS — K2950 Unspecified chronic gastritis without bleeding: Secondary | ICD-10-CM | POA: Insufficient documentation

## 2017-06-10 NOTE — Telephone Encounter (Signed)
[facility] calling back checking on the status of this request. They havent received the fax.

## 2017-06-10 NOTE — Telephone Encounter (Signed)
It may not have been faxed, please write it again on rx pad and I will sign it tomorrow Thank you

## 2017-06-10 NOTE — Telephone Encounter (Signed)
Do you remember witting this prescription out for OT for this patient?

## 2017-06-11 ENCOUNTER — Ambulatory Visit: Payer: Medicare Other | Admitting: Family Medicine

## 2017-06-11 DIAGNOSIS — R05 Cough: Secondary | ICD-10-CM | POA: Diagnosis not present

## 2017-06-11 DIAGNOSIS — J069 Acute upper respiratory infection, unspecified: Secondary | ICD-10-CM | POA: Diagnosis not present

## 2017-06-11 NOTE — Telephone Encounter (Signed)
Wrote out prescription, please sign and I will fax the order in today.

## 2017-06-22 ENCOUNTER — Other Ambulatory Visit: Payer: Self-pay | Admitting: Family Medicine

## 2017-06-22 DIAGNOSIS — Z1231 Encounter for screening mammogram for malignant neoplasm of breast: Secondary | ICD-10-CM

## 2017-06-24 DIAGNOSIS — K2270 Barrett's esophagus without dysplasia: Secondary | ICD-10-CM | POA: Diagnosis not present

## 2017-06-24 DIAGNOSIS — R1312 Dysphagia, oropharyngeal phase: Secondary | ICD-10-CM | POA: Diagnosis not present

## 2017-06-25 ENCOUNTER — Telehealth: Payer: Self-pay

## 2017-06-25 NOTE — Telephone Encounter (Signed)
Please give me the therapist letter and I can sign it.  Thank you

## 2017-06-25 NOTE — Telephone Encounter (Signed)
Copied from [place] [id number]. Topic: General - Other >> Jun 24, 2017  3:48 PM Ivar Drape wrote: Reason for CRM:   [facility] w/[place] [phone] wanted to know the status of a Therapist letter she sent over to be resigned and dated by Dr. Ancil Boozer.  Please advise.

## 2017-06-28 ENCOUNTER — Other Ambulatory Visit: Payer: Self-pay | Admitting: Gastroenterology

## 2017-06-28 DIAGNOSIS — R1310 Dysphagia, unspecified: Secondary | ICD-10-CM

## 2017-06-29 ENCOUNTER — Encounter: Payer: Self-pay | Admitting: Occupational Therapy

## 2017-06-29 ENCOUNTER — Ambulatory Visit: Payer: Medicare Other | Attending: Family Medicine | Admitting: Occupational Therapy

## 2017-06-29 DIAGNOSIS — R278 Other lack of coordination: Secondary | ICD-10-CM | POA: Diagnosis not present

## 2017-06-29 DIAGNOSIS — M6281 Muscle weakness (generalized): Secondary | ICD-10-CM | POA: Insufficient documentation
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
## 2017-07-05 ENCOUNTER — Encounter

## 2017-07-05 ENCOUNTER — Ambulatory Visit: Payer: Medicare Other | Admitting: Family Medicine

## 2017-07-07 ENCOUNTER — Telehealth: Payer: Self-pay | Admitting: Family Medicine

## 2017-07-07 DIAGNOSIS — B359 Dermatophytosis, unspecified: Secondary | ICD-10-CM

## 2017-07-07 NOTE — Telephone Encounter (Signed)
Please order the medications and I will sign and print. Thank you

## 2017-07-07 NOTE — Telephone Encounter (Signed)
Copied from [place] [id number]. Topic: General - Other >> Jul 07, 2017 11:15 AM Lennox Solders wrote: Reason for CRM: [facility] Bour program coordinator needs written order to dispensing her medications albuterol inhaler as needed , nizoral cream daily and miralax powder pt use daily, please fax written order to [phone]. Pt lives in group home [facility]

## 2017-07-08 MED ORDER — KETOCONAZOLE 2 % EX CREA: TOPICAL_CREAM | Freq: Every day | CUTANEOUS | 0 refills | Status: DC

## 2017-07-08 MED ORDER — POLYETHYLENE GLYCOL 3350 17 GM/SCOOP PO POWD: 17.0000 g | Freq: Every day | ORAL | 1 refills | Status: DC

## 2017-07-08 MED ORDER — ALBUTEROL SULFATE HFA 108 (90 BASE) MCG/ACT IN AERS: 2.0000 | INHALATION_SPRAY | RESPIRATORY_TRACT | 0 refills | Status: DC | PRN

## 2017-07-09 ENCOUNTER — Other Ambulatory Visit: Payer: Self-pay

## 2017-07-09 DIAGNOSIS — E7800 Pure hypercholesterolemia, unspecified: Secondary | ICD-10-CM

## 2017-07-09 NOTE — Telephone Encounter (Signed)
Hypertension medication request: Metoprolol   Refill on Detrol, Simvastatin, Probiotics, Omeprazole, Nortriptyline, Latuda, Iron, Aricept, Stool Softener, Pletal and Oscal with Vit D  Last office visit pertaining to hypertension: 05/03/2017   BP Readings from Last 3 Encounters:  05/03/17 130/82  04/08/17 120/80  03/25/17 (!) 127/91    Lab Results  Component Value Date   CREATININE 0.88 10/19/2016   BUN 10 10/19/2016   NA 137 10/19/2016   K 5.1 10/19/2016   CL 97 (L) 10/19/2016   CO2 33 (H) 10/19/2016     Follow up on 08/11/2017

## 2017-07-10 MED ORDER — DOCUSATE SODIUM 100 MG PO CAPS: ORAL_CAPSULE | ORAL | 5 refills | Status: DC

## 2017-07-10 MED ORDER — OMEPRAZOLE 20 MG PO CPDR: 20.0000 mg | DELAYED_RELEASE_CAPSULE | Freq: Every day | ORAL | 1 refills | Status: DC

## 2017-07-10 MED ORDER — CILOSTAZOL 50 MG PO TABS: 50.0000 mg | ORAL_TABLET | Freq: Two times a day (BID) | ORAL | 1 refills | Status: DC

## 2017-07-10 MED ORDER — FERROUS SULFATE 325 (65 FE) MG PO TABS: 325.0000 mg | ORAL_TABLET | Freq: Every day | ORAL | 1 refills | Status: DC

## 2017-07-10 MED ORDER — TOLTERODINE TARTRATE ER 4 MG PO CP24: 4.0000 mg | ORAL_CAPSULE | Freq: Every day | ORAL | 0 refills | Status: DC

## 2017-07-10 MED ORDER — PROBIOTIC-10 PO CAPS: 1.0000 | ORAL_CAPSULE | Freq: Every day | ORAL | 1 refills | Status: DC

## 2017-07-10 MED ORDER — METOPROLOL SUCCINATE ER 25 MG PO TB24: 25.0000 mg | ORAL_TABLET | Freq: Every day | ORAL | 0 refills | Status: DC

## 2017-07-10 MED ORDER — SIMVASTATIN 10 MG PO TABS: 10.0000 mg | ORAL_TABLET | Freq: Every day | ORAL | 0 refills | Status: DC

## 2017-07-10 MED ORDER — CALCIUM CARBONATE-VITAMIN D 500-200 MG-UNIT PO TABS: 1.0000 | ORAL_TABLET | Freq: Two times a day (BID) | ORAL | 5 refills | Status: DC

## 2017-07-10 NOTE — Telephone Encounter (Signed)
I am going to refill: Omeprazole, Metoprolol, simvastatin and Detrol. Other medications needs to get from neurologist. Not sure if she also see psychiatrist.

## 2017-07-13 NOTE — Telephone Encounter (Signed)
Copied from [place] [phone]. Topic: General - Other >> Jul 13, 2017  1:54 PM Keene Breath wrote: Reason for CRM: [name] [name] with [employer] called request signed forms for patient's manual wheel chair.  She stated that it was faxed on 07/02/17 and 06/17/17.  There was a line through it so they are requesting a good clean copy to be signed.  CB# [phone].

## 2017-07-13 NOTE — Telephone Encounter (Signed)
Copied from [place] [id number]. Topic: General - Other >> Jul 13, 2017  1:54 PM Keene Breath wrote: Reason for CRM: [name] [name] with [employer] called request signed forms for patient's manual wheel chair.  She stated that it was faxed on 07/02/17 and 06/17/17.  There was a line through it so they are requesting a good clean copy to be signed.  CB# [phone].  I need paperwork to be able to fill it out

## 2017-07-14 NOTE — Addendum Note (Signed)
Addended by: Garlon Hatchet T on: 07/14/2017 01:56 PM   Modules accepted: Orders

## 2017-07-20 ENCOUNTER — Ambulatory Visit: Admission: RE | Admit: 2017-07-20 | Payer: Medicare Other | Source: Ambulatory Visit

## 2017-07-28 DIAGNOSIS — R413 Other amnesia: Secondary | ICD-10-CM | POA: Diagnosis not present

## 2017-07-28 DIAGNOSIS — F070 Personality change due to known physiological condition: Secondary | ICD-10-CM | POA: Diagnosis not present

## 2017-07-28 DIAGNOSIS — S069X6S Unspecified intracranial injury with loss of consciousness greater than 24 hours without return to pre-existing conscious level with patient surviving, sequela: Secondary | ICD-10-CM | POA: Diagnosis not present

## 2017-08-02 ENCOUNTER — Ambulatory Visit
Admission: RE | Admit: 2017-08-02 | Discharge: 2017-08-02 | Disposition: A | Discharge status: Home / Self Care | Payer: Medicare Other | Source: Ambulatory Visit | Attending: Family Medicine | Admitting: Family Medicine

## 2017-08-02 DIAGNOSIS — Z1231 Encounter for screening mammogram for malignant neoplasm of breast: Secondary | ICD-10-CM | POA: Diagnosis not present

## 2017-08-03 ENCOUNTER — Telehealth: Payer: Self-pay

## 2017-08-03 NOTE — Telephone Encounter (Signed)
Copied from [place] [id number]. Topic: General - Other >> Jul 20, 2017  1:43 PM Burchel, Abbi R wrote: Reason for CRM:  [employer] ([employer]) is requesting Medical Necessity forms  (re: incontinence supplies)  faxed back.  Fax [phone] Phone [phone]  >> Jul 27, 2017  1:34 PM Yvette Rack wrote: [employer] ([employer]) is requesting status update regarding Medical Necessity forms that she faxed. Cb# [phone] >> Jul 28, 2017 11:35 AM Hollie Salk, RMA wrote: Please notify company. We have not received paperwork >> Jul 30, 2017  1:01 PM Hollie Salk, [place] wrote: Called left message for [facility] that the office has not received the paperwork. Please fax again attention: Jemarcus Dougal

## 2017-08-05 ENCOUNTER — Ambulatory Visit (INDEPENDENT_AMBULATORY_CARE_PROVIDER_SITE_OTHER): Payer: Medicare Other | Admitting: Podiatry

## 2017-08-05 ENCOUNTER — Encounter: Payer: Self-pay | Admitting: Podiatry

## 2017-08-05 VITALS — BP 137/78 | HR 63

## 2017-08-05 DIAGNOSIS — M79674 Pain in right toe(s): Secondary | ICD-10-CM

## 2017-08-05 DIAGNOSIS — M79675 Pain in left toe(s): Secondary | ICD-10-CM | POA: Diagnosis not present

## 2017-08-05 DIAGNOSIS — B353 Tinea pedis: Secondary | ICD-10-CM

## 2017-08-05 DIAGNOSIS — I739 Peripheral vascular disease, unspecified: Secondary | ICD-10-CM

## 2017-08-05 DIAGNOSIS — B351 Tinea unguium: Secondary | ICD-10-CM

## 2017-08-05 DIAGNOSIS — E1159 Type 2 diabetes mellitus with other circulatory complications: Secondary | ICD-10-CM

## 2017-08-05 NOTE — Progress Notes (Signed)
Complaint:  Visit Type: Patient returns to my office for continued preventative foot care services. Complaint: Patient states" my nails have grown Bobier and thick and become painful to walk and wear shoes" Patient has been diagnosed with DM with no foot complications. The patient presents for preventative foot care services. No changes to ROS  Podiatric Exam: Vascular: dorsalis pedis and posterior tibial pulses are not  palpable bilateral. Capillary return is immediate. Temperature gradient is WNL. Skin turgor WNL  Sensorium: Normal Semmes Weinstein monofilament test. Normal tactile sensation bilaterally. Nail Exam: Pt has thick disfigured discolored nails with subungual debris noted bilateral entire nail hallux through fifth toenails Ulcer Exam: There is no evidence of ulcer or pre-ulcerative changes or infection. Orthopedic Exam: Muscle tone and strength are WNL. No limitations in general ROM. No crepitus or effusions noted. Foot type and digits show no abnormalities. Bony prominences are unremarkable. Skin: No Porokeratosis. No infection or ulcers  Diagnosis:  Onychomycosis, , Pain in right toe, pain in left toes  Treatment & Plan Procedures and Treatment: Consent by patient was obtained for treatment procedures.   Debridement of mycotic and hypertrophic toenails, 1 through 5 bilateral and clearing of subungual debris. No ulceration, no infection noted. Patient had difficulty with dremel tool since she has history of brain trauma. Return Visit-Office Procedure: Patient instructed to return to the office for a follow up visit 3 months for continued evaluation and treatment.    Gardiner Barefoot DPM

## 2017-08-09 ENCOUNTER — Ambulatory Visit: Payer: Self-pay | Admitting: *Deleted

## 2017-08-09 ENCOUNTER — Inpatient Hospital Stay
Admission: EM | Admit: 2017-08-09 | Discharge: 2017-08-18 | DRG: 689 | Disposition: A | Discharge status: Skilled Nursing Facility | Payer: Medicare Other | Attending: Internal Medicine | Admitting: Internal Medicine

## 2017-08-09 ENCOUNTER — Ambulatory Visit: Payer: Self-pay | Admitting: Nurse Practitioner

## 2017-08-09 ENCOUNTER — Emergency Department: Payer: Medicare Other

## 2017-08-09 ENCOUNTER — Other Ambulatory Visit: Payer: Self-pay

## 2017-08-09 DIAGNOSIS — J189 Pneumonia, unspecified organism: Secondary | ICD-10-CM | POA: Diagnosis not present

## 2017-08-09 DIAGNOSIS — Z880 Allergy status to penicillin: Secondary | ICD-10-CM

## 2017-08-09 DIAGNOSIS — G8250 Quadriplegia, unspecified: Secondary | ICD-10-CM | POA: Diagnosis present

## 2017-08-09 DIAGNOSIS — R531 Weakness: Secondary | ICD-10-CM | POA: Diagnosis not present

## 2017-08-09 DIAGNOSIS — Z7401 Bed confinement status: Secondary | ICD-10-CM | POA: Diagnosis not present

## 2017-08-09 DIAGNOSIS — R0902 Hypoxemia: Secondary | ICD-10-CM | POA: Diagnosis present

## 2017-08-09 DIAGNOSIS — E785 Hyperlipidemia, unspecified: Secondary | ICD-10-CM | POA: Diagnosis present

## 2017-08-09 DIAGNOSIS — N390 Urinary tract infection, site not specified: Secondary | ICD-10-CM | POA: Diagnosis not present

## 2017-08-09 DIAGNOSIS — N3281 Overactive bladder: Secondary | ICD-10-CM | POA: Diagnosis not present

## 2017-08-09 DIAGNOSIS — Z993 Dependence on wheelchair: Secondary | ICD-10-CM

## 2017-08-09 DIAGNOSIS — Z7984 Long term (current) use of oral hypoglycemic drugs: Secondary | ICD-10-CM | POA: Diagnosis not present

## 2017-08-09 DIAGNOSIS — F329 Major depressive disorder, single episode, unspecified: Secondary | ICD-10-CM | POA: Diagnosis present

## 2017-08-09 DIAGNOSIS — Z79899 Other long term (current) drug therapy: Secondary | ICD-10-CM

## 2017-08-09 DIAGNOSIS — R0989 Other specified symptoms and signs involving the circulatory and respiratory systems: Secondary | ICD-10-CM | POA: Diagnosis present

## 2017-08-09 DIAGNOSIS — I639 Cerebral infarction, unspecified: Secondary | ICD-10-CM | POA: Diagnosis present

## 2017-08-09 DIAGNOSIS — K5909 Other constipation: Secondary | ICD-10-CM | POA: Diagnosis present

## 2017-08-09 DIAGNOSIS — K2270 Barrett's esophagus without dysplasia: Secondary | ICD-10-CM | POA: Diagnosis present

## 2017-08-09 DIAGNOSIS — Z7902 Long term (current) use of antithrombotics/antiplatelets: Secondary | ICD-10-CM

## 2017-08-09 DIAGNOSIS — R05 Cough: Secondary | ICD-10-CM | POA: Diagnosis not present

## 2017-08-09 DIAGNOSIS — Z87891 Personal history of nicotine dependence: Secondary | ICD-10-CM

## 2017-08-09 DIAGNOSIS — R1312 Dysphagia, oropharyngeal phase: Secondary | ICD-10-CM | POA: Diagnosis present

## 2017-08-09 DIAGNOSIS — I5030 Unspecified diastolic (congestive) heart failure: Secondary | ICD-10-CM | POA: Diagnosis not present

## 2017-08-09 DIAGNOSIS — R4781 Slurred speech: Secondary | ICD-10-CM | POA: Diagnosis not present

## 2017-08-09 DIAGNOSIS — S069X9A Unspecified intracranial injury with loss of consciousness of unspecified duration, initial encounter: Secondary | ICD-10-CM | POA: Diagnosis not present

## 2017-08-09 DIAGNOSIS — Z4659 Encounter for fitting and adjustment of other gastrointestinal appliance and device: Secondary | ICD-10-CM | POA: Diagnosis not present

## 2017-08-09 DIAGNOSIS — F0390 Unspecified dementia without behavioral disturbance: Secondary | ICD-10-CM | POA: Diagnosis not present

## 2017-08-09 DIAGNOSIS — S069X9S Unspecified intracranial injury with loss of consciousness of unspecified duration, sequela: Secondary | ICD-10-CM | POA: Diagnosis not present

## 2017-08-09 DIAGNOSIS — Z4682 Encounter for fitting and adjustment of non-vascular catheter: Secondary | ICD-10-CM | POA: Diagnosis not present

## 2017-08-09 DIAGNOSIS — J690 Pneumonitis due to inhalation of food and vomit: Secondary | ICD-10-CM | POA: Diagnosis not present

## 2017-08-09 DIAGNOSIS — Z7982 Long term (current) use of aspirin: Secondary | ICD-10-CM | POA: Diagnosis not present

## 2017-08-09 DIAGNOSIS — I10 Essential (primary) hypertension: Secondary | ICD-10-CM | POA: Diagnosis present

## 2017-08-09 DIAGNOSIS — Z882 Allergy status to sulfonamides status: Secondary | ICD-10-CM

## 2017-08-09 DIAGNOSIS — F3289 Other specified depressive episodes: Secondary | ICD-10-CM | POA: Diagnosis not present

## 2017-08-09 DIAGNOSIS — R4182 Altered mental status, unspecified: Secondary | ICD-10-CM | POA: Diagnosis not present

## 2017-08-09 DIAGNOSIS — E1151 Type 2 diabetes mellitus with diabetic peripheral angiopathy without gangrene: Secondary | ICD-10-CM | POA: Diagnosis present

## 2017-08-09 DIAGNOSIS — B961 Klebsiella pneumoniae [K. pneumoniae] as the cause of diseases classified elsewhere: Secondary | ICD-10-CM | POA: Diagnosis present

## 2017-08-09 DIAGNOSIS — Z7983 Long term (current) use of bisphosphonates: Secondary | ICD-10-CM

## 2017-08-09 DIAGNOSIS — M810 Age-related osteoporosis without current pathological fracture: Secondary | ICD-10-CM | POA: Diagnosis present

## 2017-08-09 DIAGNOSIS — J9811 Atelectasis: Secondary | ICD-10-CM | POA: Diagnosis not present

## 2017-08-09 DIAGNOSIS — Z0189 Encounter for other specified special examinations: Secondary | ICD-10-CM

## 2017-08-09 DIAGNOSIS — Z888 Allergy status to other drugs, medicaments and biological substances status: Secondary | ICD-10-CM

## 2017-08-09 DIAGNOSIS — R4183 Borderline intellectual functioning: Secondary | ICD-10-CM | POA: Diagnosis present

## 2017-08-09 DIAGNOSIS — K219 Gastro-esophageal reflux disease without esophagitis: Secondary | ICD-10-CM | POA: Diagnosis not present

## 2017-08-09 DIAGNOSIS — I63233 Cerebral infarction due to unspecified occlusion or stenosis of bilateral carotid arteries: Secondary | ICD-10-CM | POA: Diagnosis not present

## 2017-08-09 DIAGNOSIS — Z66 Do not resuscitate: Secondary | ICD-10-CM | POA: Diagnosis present

## 2017-08-09 DIAGNOSIS — M6281 Muscle weakness (generalized): Secondary | ICD-10-CM | POA: Diagnosis not present

## 2017-08-09 DIAGNOSIS — E119 Type 2 diabetes mellitus without complications: Secondary | ICD-10-CM | POA: Diagnosis not present

## 2017-08-09 LAB — URINALYSIS, COMPLETE (UACMP) WITH MICROSCOPIC
Bilirubin Urine: NEGATIVE
GLUCOSE, UA: NEGATIVE mg/dL
HGB URINE DIPSTICK: NEGATIVE
Ketones, ur: 20 mg/dL — AB
Leukocytes, UA: NEGATIVE
NITRITE: POSITIVE — AB
PH: 6 (ref 5.0–8.0)
PROTEIN: NEGATIVE mg/dL
SPECIFIC GRAVITY, URINE: 1.02 (ref 1.005–1.030)

## 2017-08-09 LAB — CBC
HCT: 41.3 % (ref 35.0–47.0)
Hemoglobin: 14.3 g/dL (ref 12.0–16.0)
MCH: 30.5 pg (ref 26.0–34.0)
MCHC: 34.6 g/dL (ref 32.0–36.0)
MCV: 88 fL (ref 80.0–100.0)
PLATELETS: 291 10*3/uL (ref 150–440)
RBC: 4.7 MIL/uL (ref 3.80–5.20)
RDW: 13.9 % (ref 11.5–14.5)
WBC: 10.9 10*3/uL (ref 3.6–11.0)

## 2017-08-09 LAB — DIFFERENTIAL
BASOS PCT: 1 %
Basophils Absolute: 0.1 10*3/uL (ref 0–0.1)
EOS PCT: 1 %
Eosinophils Absolute: 0.1 10*3/uL (ref 0–0.7)
LYMPHS PCT: 16 %
Lymphs Abs: 1.7 10*3/uL (ref 1.0–3.6)
Monocytes Absolute: 0.9 10*3/uL (ref 0.2–0.9)
Monocytes Relative: 8 %
NEUTROS PCT: 74 %
Neutro Abs: 8.1 10*3/uL — ABNORMAL HIGH (ref 1.4–6.5)

## 2017-08-09 LAB — PROTIME-INR: INR: <0.8

## 2017-08-09 LAB — COMPREHENSIVE METABOLIC PANEL
ALT: 16 U/L (ref 0–44)
AST: 24 U/L (ref 15–41)
Albumin: 3.7 g/dL (ref 3.5–5.0)
Alkaline Phosphatase: 113 U/L (ref 38–126)
Anion gap: 11 (ref 5–15)
BILIRUBIN TOTAL: 0.7 mg/dL (ref 0.3–1.2)
BUN: 12 mg/dL (ref 6–20)
CALCIUM: 9.4 mg/dL (ref 8.9–10.3)
CHLORIDE: 97 mmol/L — AB (ref 98–111)
CO2: 27 mmol/L (ref 22–32)
CREATININE: 0.53 mg/dL (ref 0.44–1.00)
Glucose, Bld: 119 mg/dL — ABNORMAL HIGH (ref 70–99)
Potassium: 4.7 mmol/L (ref 3.5–5.1)
Sodium: 135 mmol/L (ref 135–145)
TOTAL PROTEIN: 7.2 g/dL (ref 6.5–8.1)

## 2017-08-09 LAB — GLUCOSE, CAPILLARY
Glucose-Capillary: 128 mg/dL — ABNORMAL HIGH (ref 70–99)
Glucose-Capillary: 74 mg/dL (ref 70–99)

## 2017-08-09 LAB — TROPONIN I: Troponin I: <0.03 ng/mL (ref <0.03)

## 2017-08-09 LAB — APTT: aPTT: 27 seconds (ref 24–36)

## 2017-08-09 MED ORDER — DEXTROSE-NACL 5-0.45 % IV SOLN
INTRAVENOUS | Status: AC
  Administered 2017-08-09: via INTRAVENOUS

## 2017-08-09 MED ORDER — FERROUS SULFATE 325 (65 FE) MG PO TABS
325.0000 mg | ORAL_TABLET | Freq: Every day | ORAL | Status: DC
  Administered 2017-08-12: 08:00:00 325 mg via ORAL
  Filled 2017-08-09: qty 1

## 2017-08-09 MED ORDER — VANCOMYCIN HCL IN DEXTROSE 1-5 GM/200ML-% IV SOLN
1000.0000 mg | Freq: Once | INTRAVENOUS | Status: AC
  Administered 2017-08-10: 1000 mg via INTRAVENOUS
  Filled 2017-08-09: qty 200

## 2017-08-09 MED ORDER — INSULIN ASPART 100 UNIT/ML ~~LOC~~ SOLN: 0.0000 [IU] | Freq: Every day | SUBCUTANEOUS | Status: DC

## 2017-08-09 MED ORDER — METOPROLOL SUCCINATE ER 25 MG PO TB24: 25.0000 mg | ORAL_TABLET | Freq: Every day | ORAL | Status: DC

## 2017-08-09 MED ORDER — LEVOFLOXACIN IN D5W 750 MG/150ML IV SOLN
750.0000 mg | Freq: Once | INTRAVENOUS | Status: AC
  Administered 2017-08-09: 750 mg via INTRAVENOUS
  Filled 2017-08-09: qty 150

## 2017-08-09 MED ORDER — ALENDRONATE SODIUM 70 MG PO TABS: 70.0000 mg | ORAL_TABLET | ORAL | Status: DC

## 2017-08-09 MED ORDER — DOCUSATE SODIUM 100 MG PO CAPS
100.0000 mg | ORAL_CAPSULE | Freq: Two times a day (BID) | ORAL | Status: DC
  Administered 2017-08-12: 100 mg via ORAL
  Filled 2017-08-09 (×2): qty 1

## 2017-08-09 MED ORDER — SIMVASTATIN 20 MG PO TABS
40.0000 mg | ORAL_TABLET | Freq: Every day | ORAL | Status: DC
  Filled 2017-08-09: qty 2

## 2017-08-09 MED ORDER — ACETAMINOPHEN 650 MG RE SUPP: 650.0000 mg | RECTAL | Status: DC | PRN

## 2017-08-09 MED ORDER — ENOXAPARIN SODIUM 40 MG/0.4ML ~~LOC~~ SOLN: 40.0000 mg | SUBCUTANEOUS | Status: DC

## 2017-08-09 MED ORDER — RAMIPRIL 10 MG PO CAPS
10.0000 mg | ORAL_CAPSULE | Freq: Every day | ORAL | Status: DC
  Administered 2017-08-12: 10 mg via ORAL
  Filled 2017-08-09 (×3): qty 1

## 2017-08-09 MED ORDER — FESOTERODINE FUMARATE ER 4 MG PO TB24
4.0000 mg | ORAL_TABLET | Freq: Every day | ORAL | Status: DC
  Administered 2017-08-12 – 2017-08-18 (×7): 4 mg via ORAL
  Filled 2017-08-09 (×9): qty 1

## 2017-08-09 MED ORDER — CLOPIDOGREL BISULFATE 75 MG PO TABS: 75.0000 mg | ORAL_TABLET | Freq: Every day | ORAL | Status: DC

## 2017-08-09 MED ORDER — MEMANTINE HCL ER 28 MG PO CP24
28.0000 mg | ORAL_CAPSULE | Freq: Every day | ORAL | Status: DC
  Administered 2017-08-12 – 2017-08-18 (×7): 28 mg via ORAL
  Filled 2017-08-09 (×9): qty 1

## 2017-08-09 MED ORDER — FLUOXETINE HCL 10 MG PO CAPS
10.0000 mg | ORAL_CAPSULE | Freq: Every day | ORAL | Status: DC
  Administered 2017-08-12: 11:00:00 10 mg via ORAL
  Filled 2017-08-09 (×3): qty 1

## 2017-08-09 MED ORDER — ENOXAPARIN SODIUM 40 MG/0.4ML ~~LOC~~ SOLN
40.0000 mg | SUBCUTANEOUS | Status: DC
  Administered 2017-08-10 – 2017-08-17 (×9): 40 mg via SUBCUTANEOUS
  Filled 2017-08-09 (×9): qty 0.4

## 2017-08-09 MED ORDER — LURASIDONE HCL 40 MG PO TABS
20.0000 mg | ORAL_TABLET | Freq: Every day | ORAL | Status: DC
  Administered 2017-08-12: 11:00:00 20 mg via ORAL
  Filled 2017-08-09 (×3): qty 1

## 2017-08-09 MED ORDER — DESMOPRESSIN ACETATE 0.2 MG PO TABS
200.0000 ug | ORAL_TABLET | Freq: Every day | ORAL | Status: DC
  Filled 2017-08-09 (×4): qty 1

## 2017-08-09 MED ORDER — STROKE: EARLY STAGES OF RECOVERY BOOK
Freq: Once | Status: AC
  Administered 2017-08-09

## 2017-08-09 MED ORDER — LACTATED RINGERS IV SOLN: INTRAVENOUS | Status: DC

## 2017-08-09 MED ORDER — CILOSTAZOL 100 MG PO TABS
50.0000 mg | ORAL_TABLET | Freq: Two times a day (BID) | ORAL | Status: DC
  Administered 2017-08-12: 50 mg via ORAL
  Filled 2017-08-09 (×7): qty 0.5

## 2017-08-09 MED ORDER — SENNOSIDES-DOCUSATE SODIUM 8.6-50 MG PO TABS: 1.0000 | ORAL_TABLET | Freq: Every evening | ORAL | Status: DC | PRN

## 2017-08-09 MED ORDER — ALBUTEROL SULFATE (2.5 MG/3ML) 0.083% IN NEBU
2.5000 mg | INHALATION_SOLUTION | RESPIRATORY_TRACT | Status: DC | PRN
Start: 2017-08-09 — End: 2017-08-18
  Administered 2017-08-10: 09:00:00 2.5 mg via RESPIRATORY_TRACT
  Filled 2017-08-09: qty 3

## 2017-08-09 MED ORDER — ACETAMINOPHEN 160 MG/5ML PO SOLN
650.0000 mg | ORAL | Status: DC | PRN
  Filled 2017-08-09: qty 20.3

## 2017-08-09 MED ORDER — DONEPEZIL HCL 5 MG PO TABS
10.0000 mg | ORAL_TABLET | Freq: Every day | ORAL | Status: DC
  Administered 2017-08-12: 11:00:00 10 mg via ORAL
  Filled 2017-08-09 (×3): qty 2

## 2017-08-09 MED ORDER — KETOCONAZOLE 2 % EX CREA
1.0000 "application " | TOPICAL_CREAM | Freq: Every day | CUTANEOUS | Status: DC
  Administered 2017-08-10 – 2017-08-14 (×5): 1 via TOPICAL
  Filled 2017-08-09: qty 15

## 2017-08-09 MED ORDER — ACETAMINOPHEN 325 MG PO TABS: 650.0000 mg | ORAL_TABLET | ORAL | Status: DC | PRN

## 2017-08-09 MED ORDER — SODIUM CHLORIDE 0.9 % IV SOLN
1.0000 g | Freq: Two times a day (BID) | INTRAVENOUS | Status: DC
  Administered 2017-08-10 – 2017-08-11 (×4): 1 g via INTRAVENOUS
  Filled 2017-08-09 (×5): qty 1

## 2017-08-09 MED ORDER — INSULIN ASPART 100 UNIT/ML ~~LOC~~ SOLN
0.0000 [IU] | Freq: Three times a day (TID) | SUBCUTANEOUS | Status: DC
  Administered 2017-08-10: 12:00:00 2 [IU] via SUBCUTANEOUS
  Administered 2017-08-10: 3 [IU] via SUBCUTANEOUS
  Administered 2017-08-11 (×2): 2 [IU] via SUBCUTANEOUS
  Administered 2017-08-12: 3 [IU] via SUBCUTANEOUS
  Administered 2017-08-12 – 2017-08-13 (×2): 2 [IU] via SUBCUTANEOUS
  Administered 2017-08-14: 5 [IU] via SUBCUTANEOUS
  Administered 2017-08-14 (×2): 3 [IU] via SUBCUTANEOUS
  Administered 2017-08-15: 5 [IU] via SUBCUTANEOUS
  Administered 2017-08-15 (×2): 3 [IU] via SUBCUTANEOUS
  Administered 2017-08-16 (×2): 5 [IU] via SUBCUTANEOUS
  Filled 2017-08-09 (×15): qty 1

## 2017-08-09 NOTE — Progress Notes (Signed)
Family Meeting Note  Advance Directive:yes  Today a meeting took place with the Patient.  Patient is able to participate   The following clinical team members were present during this meeting:MD  The following were discussed:Patient's diagnosis: Traumatic brain injury, diabetes, CVA, aspiration pneumonia, UTI, Patient's progosis: Unable to determine and Goals for treatment: DNR  Additional follow-up to be provided: prn  Time spent during discussion:20 minutes  Gorden Harms, MD

## 2017-08-09 NOTE — ED Notes (Signed)
3333 notified code stroke, patient being triaged in room 17

## 2017-08-09 NOTE — Telephone Encounter (Signed)
Pt's program coordinator at [facility] called with symptoms the patient is demonstrating. The staff reported to her that the patient, who is wheelchair bound is leaning to the right side and is drooling. And when asked if she felt normal, she said no. Pt has been alert and verbal the whole time. She is not feverish. No vital signs available at this time. Pt has not fallen or injured herself. There has been no changes to her medications Staff would like for the patient to be assessed with her pcp. Appointment scheduled for this afternoon per staff request. Will route to flow at [facility] advised to call back if experiencing increase in symptoms.  Reason for Disposition . [1] Weakness of the face, arm / hand, or leg / foot on one side of the body AND [2] gradual onset (e.g., days to weeks) AND [3] present now  Answer Assessment - Initial Assessment Questions 1. SYMPTOM: "What is the main symptom you are concerned about?" (e.g., weakness, numbness)     Drooling and leaning to the right 2. ONSET: "When did this start?" (minutes, hours, days; while sleeping)     Over the weekend 3. LAST NORMAL: "When was the last time you were normal (no symptoms)?"     Not sure 4. PATTERN "Does this come and go, or has it been constant since it started?"  "Is it present now?"     constant 5. CARDIAC SYMPTOMS: "Have you had any of the following symptoms: chest pain, difficulty breathing, palpitations?"     none 6. NEUROLOGIC SYMPTOMS: "Have you had any of the following symptoms: headache, dizziness, vision loss, double vision, changes in speech, unsteady on your feet?"     When asked if patient felt normal, she replied no. 7. OTHER SYMPTOMS: "Do you have any other symptoms?"     no 8. PREGNANCY: "Is there any chance you are pregnant?" "When was your last menstrual period?"     n/a  Protocols used: NEUROLOGIC DEFICIT-A-AH

## 2017-08-09 NOTE — ED Triage Notes (Signed)
Pt arrives to ED with R sided weakness. Is from [facility]. Caregivers brought pt to ED d/t leaning toward R side and R sided weakness that began sometime today. Was at day program and dropped off at group home who called caregivers. Two caregivers at bedside. Pt is leaning to R. Facial droop noted but when smiles is equal. Denies pain. Caregivers state speech seems like it's more  Difficult for her to get words out.   Unknown last known well d/t being at day program.

## 2017-08-09 NOTE — Code Documentation (Signed)
Upon arrival to room 17 Dr. Kerman Passey at bedside, caregivers at bedside, pt has hx of brain injury per caregiver and is wheelchair bound, states they were called from pts care center and told she was leaning to the right and drooling, when asked about LKW caregivers state pt has had right leg weakness intermittently over the weekend and worsening slurred speech, LKW is unclear per caregivers, Code Stroke canceled per Dr. Kerman Passey, report off to [facility]
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
## 2017-08-09 NOTE — Progress Notes (Signed)
Chaplain responded to code STROKE. Upon arrival to the pt room, chaplain noted that medical staff were conducting an assessment. Patient was alert. Patient's status downgraded from code STROKE, then she was taken to CT for scan. Chaplain provided prayer, spiritual and emotional support to group home staff while patient was away for testing. Group home staff thanked chaplain for support. Will continue to await results and pg as needed.    08/09/17 1600  Clinical Encounter Type  Visited With Patient;Other (Comment)  Visit Type Code  Spiritual Encounters  Spiritual Needs Prayer

## 2017-08-10 ENCOUNTER — Inpatient Hospital Stay: Payer: Medicare Other

## 2017-08-10 ENCOUNTER — Other Ambulatory Visit: Payer: Self-pay

## 2017-08-10 ENCOUNTER — Inpatient Hospital Stay (HOSPITAL_COMMUNITY)
Admit: 2017-08-10 | Discharge: 2017-08-10 | Disposition: A | Discharge status: Home / Self Care | Payer: Medicare Other | Attending: Family Medicine | Admitting: Family Medicine

## 2017-08-10 DIAGNOSIS — I5030 Unspecified diastolic (congestive) heart failure: Secondary | ICD-10-CM

## 2017-08-10 DIAGNOSIS — R531 Weakness: Secondary | ICD-10-CM

## 2017-08-10 LAB — GLUCOSE, CAPILLARY
GLUCOSE-CAPILLARY: 117 mg/dL — AB (ref 70–99)
GLUCOSE-CAPILLARY: 156 mg/dL — AB (ref 70–99)
GLUCOSE-CAPILLARY: 114 mg/dL — AB (ref 70–99)
GLUCOSE-CAPILLARY: 133 mg/dL — AB (ref 70–99)
Glucose-Capillary: 134 mg/dL — ABNORMAL HIGH (ref 70–99)
Glucose-Capillary: 91 mg/dL (ref 70–99)

## 2017-08-10 LAB — MRSA PCR SCREENING: MRSA by PCR: NEGATIVE

## 2017-08-10 LAB — LIPID PANEL
CHOL/HDL RATIO: 1.8 ratio
Cholesterol: 127 mg/dL (ref 0–200)
HDL: 69 mg/dL (ref >40)
LDL Cholesterol: 43 mg/dL (ref 0–99)
TRIGLYCERIDES: 75 mg/dL (ref <150)
VLDL: 15 mg/dL (ref 0–40)

## 2017-08-10 LAB — STREP PNEUMONIAE URINARY ANTIGEN: Strep Pneumo Urinary Antigen: NEGATIVE

## 2017-08-10 LAB — ECHOCARDIOGRAM COMPLETE
Height: 66 in
Weight: 2265.6 oz

## 2017-08-10 MED ORDER — CHLORHEXIDINE GLUCONATE 0.12 % MT SOLN
15.0000 mL | Freq: Two times a day (BID) | OROMUCOSAL | Status: DC
  Administered 2017-08-10 – 2017-08-17 (×15): 15 mL via OROMUCOSAL
  Filled 2017-08-10 (×14): qty 15

## 2017-08-10 MED ORDER — DEXTROSE-NACL 5-0.9 % IV SOLN
INTRAVENOUS | Status: DC
  Administered 2017-08-10 – 2017-08-16 (×8): via INTRAVENOUS

## 2017-08-10 MED ORDER — SODIUM CHLORIDE 0.9 % IV SOLN
INTRAVENOUS | Status: DC
  Administered 2017-08-10: 14:00:00 via INTRAVENOUS

## 2017-08-10 MED ORDER — VANCOMYCIN HCL IN DEXTROSE 1-5 GM/200ML-% IV SOLN
1000.0000 mg | Freq: Two times a day (BID) | INTRAVENOUS | Status: DC
  Administered 2017-08-10: 1000 mg via INTRAVENOUS
  Filled 2017-08-10 (×2): qty 200

## 2017-08-10 MED ORDER — METOPROLOL TARTRATE 5 MG/5ML IV SOLN
5.0000 mg | Freq: Three times a day (TID) | INTRAVENOUS | Status: DC
  Administered 2017-08-10 – 2017-08-18 (×24): 5 mg via INTRAVENOUS
  Filled 2017-08-10 (×24): qty 5

## 2017-08-10 MED ORDER — PHENOL 1.4 % MT LIQD
1.0000 | OROMUCOSAL | Status: DC | PRN
  Filled 2017-08-10: qty 177

## 2017-08-10 MED ORDER — ORAL CARE MOUTH RINSE
15.0000 mL | Freq: Two times a day (BID) | OROMUCOSAL | Status: DC
  Administered 2017-08-10 – 2017-08-16 (×12): 15 mL via OROMUCOSAL

## 2017-08-10 NOTE — Consult Note (Signed)
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 cerebral white matter signal changes which could be posttraumatic or post ischemic. 3. Chronic C4 level cervical spinal stenosis. 4.  Negative intracranial MRA. Electronically Signed   By: Genevie Ann M.D.   On: 08/10/2017 05:42     Assessment/Plan: 57 year old female presenting with difficulty with speech.  Patient overall appeared to be functioning below baseline.  Was noted to have a UTI and bronchitis.  MRI of the brain reviewed and shows chronic left brainstem and cerebellar changes likely from head injury but no evidence of acute changes.  Presenting symptoms likely secondary to infection.    No further neurologic intervention is recommended at this time.  If further questions arise, please call or page at that time.  Thank you for allowing neurology to participate in the care of this patient.   Alexis Goodell, MD Neurology [phone] 08/10/2017, 12:40 PM

## 2017-08-10 NOTE — Plan of Care (Signed)
  Problem: Education: Goal: Knowledge of General Education information will improve Outcome: Progressing   Problem: Health Behavior/Discharge Planning: Goal: Ability to manage health-related needs will improve Outcome: Progressing   Problem: Clinical Measurements: Goal: Ability to maintain clinical measurements within normal limits will improve Outcome: Progressing Goal: Will remain free from infection Outcome: Progressing Goal: Diagnostic test results will improve Outcome: Progressing Goal: Respiratory complications will improve Outcome: Progressing Goal: Cardiovascular complication will be avoided Outcome: Progressing   Problem: Activity: Goal: Risk for activity intolerance will decrease Outcome: Progressing   Problem: Nutrition: Goal: Adequate nutrition will be maintained Outcome: Progressing   Problem: Coping: Goal: Level of anxiety will decrease Outcome: Progressing   Problem: Elimination: Goal: Will not experience complications related to bowel motility Outcome: Progressing Goal: Will not experience complications related to urinary retention Outcome: Progressing   Problem: Pain Managment: Goal: General experience of comfort will improve Outcome: Progressing   Problem: Skin Integrity: Goal: Risk for impaired skin integrity will decrease Outcome: Progressing   Problem: Education: Goal: Knowledge of secondary prevention will improve Outcome: Progressing Goal: Knowledge of patient specific risk factors addressed and post discharge goals established will improve Outcome: Progressing

## 2017-08-10 NOTE — Progress Notes (Signed)
Initial Nutrition Assessment  DOCUMENTATION CODES:   Not applicable  INTERVENTION:   RD will order supplements when diet advanced  Recommend daily MVI  Recommend vitamin C 220m BID daily  NUTRITION DIAGNOSIS:   Inadequate oral intake related to dysphagia as evidenced by NPO status.  GOAL:   Patient will meet greater than or equal to 90% of their needs  MONITOR:   Diet advancement, Labs, Weight trends, Skin, I & O's  REASON FOR ASSESSMENT:   Malnutrition Screening Tool    ASSESSMENT:   57y.o. female  with a known history of traumatic brain injury at age [age]with cognitive deficits and right sided weakness.  Patient presented from group home setting with 3-week history of dragging her right leg, caregivers noted difficulty with speech. Pt found to have UTI and bronchitis.   Met with pt in room today. Unable to communicate with pt to obtain nutrition related history. Per chart, pt appears fairly weight stable pta. Per chart, pt resides in a group home and follows a dysphagia 1/honey thick diet at baseline. MRI negative for any acute changes. Pt NPO and was unable to pass her CSE exam today; plan is for MBSS tomorrow. RD will order supplements when diet advanced. Pt noted to have ecchymosis; recommend vitamin C supplementation and daily MVI.   Medications reviewed and include: plavix, colace, lovenox, ferrous sulfate, insulin, NaCl @75ml /hr, cefepime, vancomycin  Labs reviewed: cbgs- 91, 117, 156, 134 x 24 hrs  NUTRITION - FOCUSED PHYSICAL EXAM:    Most Recent Value  Orbital Region  No depletion  Upper Arm Region  No depletion  Thoracic and Lumbar Region  No depletion  Buccal Region  No depletion  Temple Region  No depletion  Clavicle Bone Region  No depletion  Clavicle and Acromion Bone Region  No depletion  Scapular Bone Region  No depletion  Dorsal Hand  No depletion  Patellar Region  Mild depletion  Anterior Thigh Region  No depletion  Posterior Calf Region   Moderate depletion  Edema (RD Assessment)  None  Hair  Reviewed  Eyes  Reviewed  Mouth  Reviewed  Skin  Reviewed  Nails  Reviewed     Diet Order:   Diet Order           Diet NPO time specified  Diet effective now         EDUCATION NEEDS:   Not appropriate for education at this time  Skin:  Skin Assessment: Reviewed RN Assessment(ecchymosis, abrasions)  Last BM:  unknown   Height:   Ht Readings from Last 1 Encounters:  08/09/17 5' 6"  (1.676 m)    Weight:   Wt Readings from Last 1 Encounters:  08/09/17 141 lb 9.6 oz (64.2 kg)    Ideal Body Weight:  59 kg  BMI:  Body mass index is 22.85 kg/m.  Estimated Nutritional Needs:   Kcal:  1500-1800kcal/day   Protein:  66-73g/day   Fluid:  >1.6L/day   CKoleen DistanceMS, RD, LDN Pager #- [phone]#- [phone] Hours Pager: [phone]

## 2017-08-10 NOTE — Progress Notes (Signed)
*  PRELIMINARY RESULTS* Echocardiogram 2D Echocardiogram has been performed.  [name] [name] 08/10/2017, 10:54 AM

## 2017-08-10 NOTE — Progress Notes (Signed)
[facility] [place] NAME: [name] [name]    MR#:  [id number]  DATE OF BIRTH:  12-16-1960  SUBJECTIVE:  CHIEF COMPLAINT:   Chief Complaint  Patient presents with  . Weakness  Patient seen and evaluated today Awake and lying on the bed Has generalized weakness Failed swallow study  REVIEW OF SYSTEMS:    ROS Unable to obtain secondary to organic brain disease  DRUG ALLERGIES:   Allergies  Allergen Reactions  . Dilantin [Phenytoin] Other (See Comments)    unknown  . Penicillins Other (See Comments)    .Has patient had a PCN reaction causing immediate rash, facial/tongue/throat swelling, SOB or lightheadedness with hypotension: Unknown Has patient had a PCN reaction causing severe rash involving mucus membranes or skin necrosis: Unknown Has patient had a PCN reaction that required hospitalization: Unknown Has patient had a PCN reaction occurring within the last 10 years: Unknown If all of the above answers are "NO", then may proceed with Cephalosporin use.   Marland Kitchen Phenytoin Sodium Extended Other (See Comments)    Other reaction(s): Other (See Comments)  . Phenytoin Sodium Extended Other (See Comments)    Other reaction(s): Other (See Comments)  . Sulfa Antibiotics Rash and Other (See Comments)    unknown    VITALS:  Blood pressure (!) 155/93, pulse 92, temperature 98.1 F (36.7 C), temperature source Oral, resp. rate 18, height 5\' 6"  (1.676 m), weight 67.4 kg (148 lb 8 oz), SpO2 96 %.  PHYSICAL EXAMINATION:   Physical Exam  GENERAL:  57 y.o.-year-old patient lying in the bed with no acute distress.  EYES: Pupils equal, round, reactive to light and accommodation. No scleral icterus. Extraocular muscles intact.  HEENT: Head atraumatic, normocephalic. Oropharynx and nasopharynx clear.  NECK:  Supple, no jugular venous distention. No thyroid enlargement, no tenderness.  LUNGS: Normal breath sounds bilaterally, no wheezing, rales,  rhonchi. No use of accessory muscles of respiration.  CARDIOVASCULAR: S1, S2 normal. No murmurs, rubs, or gallops.  ABDOMEN: Soft, nontender, nondistended. Bowel sounds present. No organomegaly or mass.  EXTREMITIES: No cyanosis, clubbing or edema b/l.    NEUROLOGIC: Cranial nerves II through XII are intact. MAES - globally weak PSYCHIATRIC: slow mentation SKIN: No obvious rash, lesion, or ulcer.   LABORATORY PANEL:   CBC Recent Labs  Lab 08/09/17 1611  WBC 10.9  HGB 14.3  HCT 41.3  PLT 291   ------------------------------------------------------------------------------------------------------------------ Chemistries  Recent Labs  Lab 08/09/17 1658  NA 135  K 4.7  CL 97*  CO2 27  GLUCOSE 119*  BUN 12  CREATININE 0.53  CALCIUM 9.4  AST 24  ALT 16  ALKPHOS 113  BILITOT 0.7   ------------------------------------------------------------------------------------------------------------------  Cardiac Enzymes Recent Labs  Lab 08/09/17 1658  TROPONINI <0.03   ------------------------------------------------------------------------------------------------------------------  RADIOLOGY:  Dg Chest 2 View  Result Date: 08/09/2017 CLINICAL DATA:  Cough. EXAM: CHEST - 2 VIEW COMPARISON:  Chest x-ray dated February 21, 2017. FINDINGS: The heart size and mediastinal contours are within normal limits. Normal pulmonary vascularity. No focal consolidation, pleural effusion, or pneumothorax. No acute osseous abnormality. IMPRESSION: No active cardiopulmonary disease. Electronically Signed   By: Titus Dubin M.D.   On: 08/09/2017 16:53   Ct Head Wo Contrast  Result Date: 08/09/2017 CLINICAL DATA:  Right-sided weakness beginning sometime today. EXAM: CT HEAD WITHOUT CONTRAST TECHNIQUE: Contiguous axial images were obtained from the base of the skull through the vertex without intravenous contrast. COMPARISON:  10/06/2012 FINDINGS: Brain: No evidence  of acute infarction, hemorrhage,  hydrocephalus, extra-axial collection or mass lesion/mass effect. There is ventricular and sulcal enlargement reflecting atrophy, which is most severe involving the cerebellum. 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 Mild periventricular white matter hypoattenuation, also stable consistent with chronic microvascular ischemic change. Vascular: No hyperdense vessel or unexpected calcification. Skull: Normal. Negative for fracture or focal lesion. Sinuses/Orbits: Globes and orbits are unremarkable. Visualized sinuses and mastoid air cells are clear. Other: None. IMPRESSION: 1. No acute intracranial abnormalities. 2. Atrophy, small old frontal lobe infarct and chronic microvascular ischemic change stable from the prior exam. Electronically Signed   By: Lajean Manes M.D.   On: 08/09/2017 16:40   Ct Chest Wo Contrast  Result Date: 08/09/2017 CLINICAL DATA:  Patient coughing with wet sounding cough. EXAM: CT CHEST WITHOUT CONTRAST TECHNIQUE: Multidetector CT imaging of the chest was performed following the standard protocol without IV contrast. COMPARISON:  CXR 08/09/2017 FINDINGS: Cardiovascular: Nonaneurysmal atherosclerotic aorta. Mild cardiomegaly with trace pericardial effusion. The unenhanced pulmonary vessels are unremarkable. Mediastinum/Nodes: No enlarged mediastinal or axillary lymph nodes. Thyroid gland and esophagus demonstrate no significant findings. Lungs/Pleura: Bibasilar dependent atelectasis. There are faint soft tissue densities noted within the central airway and distal trachea that could potentially represent mucus or debris though assessment is limited by motion. No pulmonary consolidation or pneumothorax. No dominant mass. Bronchitic change to both lower lobes and right middle lobe with peribronchial thickening. Upper Abdomen: Cholecystectomy.  No acute abnormality. Musculoskeletal: Lower cervical spondylosis with marked disc space narrowing, endplate spurring and disc flattening.  No acute osseous abnormality. Lesser degree of degenerative change along the thoracic spine. IMPRESSION: 1. Bibasilar dependent atelectasis. 2. Faint soft tissue densities in the trachea, mainstem bronchi and right lower lobe bronchi may reflect soft tissue debris and/or mucous. Bronchitic change is identified both lower lobes and right middle lobe. 3. Cardiomegaly with trace pericardial effusion. 4. Spondylosis of the included lower cervical spine. Lesser degree of degenerative change along the thoracic spine. Aortic Atherosclerosis (ICD10-I70.0). Electronically Signed   By: Ashley Royalty M.D.   On: 08/09/2017 19:25   Mr Brain Wo Contrast  Result Date: 08/10/2017 CLINICAL DATA:  58 year old female with recent onset unexplained right side weakness, began dragging right leg. History of remote traumatic brain injury with cognitive deficits. EXAM: MRI HEAD WITHOUT CONTRAST MRA HEAD WITHOUT CONTRAST TECHNIQUE: Multiplanar, multiecho pulse sequences of the brain and surrounding structures were obtained without intravenous contrast. Angiographic images of the head were obtained using MRA technique without contrast. COMPARISON:  Head CT without contrast 08/09/2017. Brain MRI 08/04/2012. cervical spine CT 09/15/2010. FINDINGS: MRI HEAD FINDINGS Brain: No restricted diffusion or evidence of acute infarction. Pronounced chronic encephalomalacia in the brainstem and cerebellum appear stable since 2014. Suspected chronic blood products in the midbrain on the left where Wallerian degeneration is more pronounced. Chronic encephalomalacia in both anterior superior frontal gyri with regional white matter gliosis. Chronic patchy white matter T2 and FLAIR hyperintensity in the left inferior frontal gyrus and right corona radiata. Scattered small nonspecific foci of cerebral white matter T2 and FLAIR hyperintensity elsewhere are stable to mildly increased since 2014. Ex vacuo ventricular enlargement has mildly increased since 2014.  No midline shift, mass effect, evidence of mass lesion, extra-axial collection or acute intracranial hemorrhage. Cervicomedullary junction and pituitary are within normal limits. Vascular: Major intracranial vascular flow voids are stable since 2014 and preserved. Skull and upper cervical spine: Stable visible cervical spine with chronic degenerative C4 level spinal stenosis (series 10, image  13), also visible on the 2012 cervical spine CT. Sinuses/Orbits: Stable and negative. Other: Mastoid air cells are clear. Visible internal auditory structures appear stable. Scalp and face soft tissues appear negative. MRA HEAD FINDINGS Antegrade flow in the posterior circulation with mildly dominant distal left vertebral artery. The left PICA origin is patent. The right AICA is patent and may be dominant. No distal vertebral or basilar stenosis. Fetal type bilateral PCA origins, more so the right. Patent SCA origins. Bilateral PCA branches are within normal limits allowing for mild motion artifact. Antegrade flow in both ICA siphons. No siphon stenosis. Normal ophthalmic and posterior communicating artery origins. Normal carotid termini, MCA and ACA origins. The left A1 is mildly dominant. Anterior communicating artery is within normal limits. Bilateral MCA M1 segments, MCA bifurcations, and visible bilateral MCA and ACA branches are within normal limits allowing for mild motion artifact. IMPRESSION: 1.  No acute intracranial abnormality. 2. Largely unchanged noncontrast MRI appearance of the brain since 2014: - chronic severe brainstem and cerebellar encephalomalacia with left midbrain hemosiderin. - chronic anterior superior frontal gyri encephalomalacia. - ex vacuo ventricular enlargement. - mild additional nonspecific cerebral white matter signal changes which could be posttraumatic or post ischemic. 3. Chronic C4 level cervical spinal stenosis. 4.  Negative intracranial MRA. Electronically Signed   By: Genevie Ann M.D.   On:  08/10/2017 05:42   US Carotid Bilateral (at [facility] And [facility] Only)  Result Date: 08/10/2017 CLINICAL DATA:  CVA. History of hypertension, hyperlipidemia and diabetes. Former smoker. EXAM: BILATERAL CAROTID DUPLEX ULTRASOUND TECHNIQUE: Pearline Cables scale imaging, color Doppler and duplex ultrasound were performed of bilateral carotid and vertebral arteries in the neck. COMPARISON:  None. FINDINGS: Criteria: Quantification of carotid stenosis is based on velocity parameters that correlate the residual internal carotid diameter with NASCET-based stenosis levels, using the diameter of the distal internal carotid lumen as the denominator for stenosis measurement. The following velocity measurements were obtained: RIGHT ICA:  74/22 cm/sec CCA:  16/10 cm/sec SYSTOLIC ICA/CCA RATIO:  1.0 ECA:  72 cm/sec LEFT ICA:  83/21 cm/sec CCA:  96/04 cm/sec SYSTOLIC ICA/CCA RATIO:  1.1 ECA:  87 cm/sec RIGHT CAROTID ARTERY: There is a minimal amount of echogenic plaque within the right carotid bulb (image 15), extending to involve the origin and proximal aspects of the right internal carotid artery (image 22), not resulting in elevated peak systolic velocities within the interrogated course the right internal carotid artery to suggest a hemodynamically significant stenosis. RIGHT VERTEBRAL ARTERY:  Antegrade flow LEFT CAROTID ARTERY: There is a moderate to large amount of echogenic plaque within the left carotid bulb (image 47), extending to involve the origin and proximal aspects of the left internal carotid artery (image 54), not resulting in elevated peak systolic velocities within the interrogated course the left internal carotid artery to suggest a hemodynamically significant stenosis. LEFT VERTEBRAL ARTERY:  Antegrade flow IMPRESSION: 1. Moderate to large amount of left-sided atherosclerotic plaque, not resulting in a hemodynamically significant stenosis. 2. Minimal amount of right-sided atherosclerotic plaque, not resulting in a  hemodynamically significant stenosis. Electronically Signed   By: Sandi Mariscal M.D.   On: 08/10/2017 12:24   Mr Jodene Nam Head/brain VW Cm  Result Date: 08/10/2017 CLINICAL DATA:  57 year old female with recent onset unexplained right side weakness, began dragging right leg. History of remote traumatic brain injury with cognitive deficits. EXAM: MRI HEAD WITHOUT CONTRAST MRA HEAD WITHOUT CONTRAST TECHNIQUE: Multiplanar, multiecho pulse sequences of the brain and surrounding structures were obtained without intravenous  contrast. Angiographic images of the head were obtained using MRA technique without contrast. COMPARISON:  Head CT without contrast 08/09/2017. Brain MRI 08/04/2012. cervical spine CT 09/15/2010. FINDINGS: MRI HEAD FINDINGS Brain: No restricted diffusion or evidence of acute infarction. Pronounced chronic encephalomalacia in the brainstem and cerebellum appear stable since 2014. Suspected chronic blood products in the midbrain on the left where Wallerian degeneration is more pronounced. Chronic encephalomalacia in both anterior superior frontal gyri with regional white matter gliosis. Chronic patchy white matter T2 and FLAIR hyperintensity in the left inferior frontal gyrus and right corona radiata. Scattered small nonspecific foci of cerebral white matter T2 and FLAIR hyperintensity elsewhere are stable to mildly increased since 2014. Ex vacuo ventricular enlargement has mildly increased since 2014. No midline shift, mass effect, evidence of mass lesion, extra-axial collection or acute intracranial hemorrhage. Cervicomedullary junction and pituitary are within normal limits. Vascular: Major intracranial vascular flow voids are stable since 2014 and preserved. Skull and upper cervical spine: Stable visible cervical spine with chronic degenerative C4 level spinal stenosis (series 10, image 13), also visible on the 2012 cervical spine CT. Sinuses/Orbits: Stable and negative. Other: Mastoid air cells are  clear. Visible internal auditory structures appear stable. Scalp and face soft tissues appear negative. MRA HEAD FINDINGS Antegrade flow in the posterior circulation with mildly dominant distal left vertebral artery. The left PICA origin is patent. The right AICA is patent and may be dominant. No distal vertebral or basilar stenosis. Fetal type bilateral PCA origins, more so the right. Patent SCA origins. Bilateral PCA branches are within normal limits allowing for mild motion artifact. Antegrade flow in both ICA siphons. No siphon stenosis. Normal ophthalmic and posterior communicating artery origins. Normal carotid termini, MCA and ACA origins. The left A1 is mildly dominant. Anterior communicating artery is within normal limits. Bilateral MCA M1 segments, MCA bifurcations, and visible bilateral MCA and ACA branches are within normal limits allowing for mild motion artifact. IMPRESSION: 1.  No acute intracranial abnormality. 2. Largely unchanged noncontrast MRI appearance of the brain since 2014: - chronic severe brainstem and cerebellar encephalomalacia with left midbrain hemosiderin. - chronic anterior superior frontal gyri encephalomalacia. - ex vacuo ventricular enlargement. - mild additional nonspecific cerebral white matter signal changes which could be posttraumatic or post ischemic. 3. Chronic C4 level cervical spinal stenosis. 4.  Negative intracranial MRA. Electronically Signed   By: Genevie Ann M.D.   On: 08/10/2017 05:42     ASSESSMENT AND PLAN:   57 year old female patient with history of traumatic brain injury, organic brain disease, diabetes mellitus type 2 currently under hospitalist service for generalized weakness  -Acute urinary tract infection Continue IV cefepime antibiotic DC vancomycin Follow-up cultures  -Dysphagia Modified barium swallow study in am Status post speech therapy evaluation  -Global weakness  CVA ruled out  imaging studies were negative for any  stroke  -Diabetes mellitus type 2 Medical management and sliding scale insulin    All the records are reviewed and case discussed with Care Management/Social Worker. Management plans discussed with the patient, family and they are in agreement.  CODE STATUS:DNR  DVT Prophylaxis: SCDs  TOTAL TIME TAKING CARE OF THIS PATIENT: 36 minutes.   POSSIBLE D/C IN 2 to 3 DAYS, DEPENDING ON CLINICAL CONDITION.  Saundra Shelling M.D on 08/10/2017 at 2:45 PM  Between 7am to 6pm - Pager - [phone]  After 6pm go to [email or web address] - password [email or web address] [facility]  Office  [phone]  CC: Primary care physician;  Steele Sizer, MD  Note: This dictation was prepared with Dragon dictation along with smaller phrase technology. Any transcriptional errors that result from this process are unintentional.
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
## 2017-08-10 NOTE — Clinical Social Work Note (Signed)
Clinical Social Work Assessment  Patient Details  Name: [name] [name] MRN: [id number] Date of Birth: 04/26/1960  Date of referral:  08/10/17               Reason for consult:  Facility Placement                Permission sought to share information with:  Case Manager, Customer service manager, Family Supports Permission granted to share information::  Yes, Verbal Permission Granted  Name::        Agency::     Relationship::     Contact Information:     Housing/Transportation Living arrangements for the past 2 months:  Group Home Source of Information:  Guardian Patient Interpreter Needed:  None Criminal Activity/Legal Involvement Pertinent to Current Situation/Hospitalization:  No - Comment as needed Significant Relationships:  Siblings Lives with:  Facility Resident Do you feel safe going back to the place where you live?  Yes Need for family participation in patient care:  Yes (Comment)  Care giving concerns:  Patient is a [facility] term resident at [facility] Worker assessment / plan:  CSW received consult for facility placement. CSW met with patient but she asked that [place] call guardian. CSW contacted patient's guardian Maximino Sarin [phone] who is also her sister. CSW introduced self and explained role. Guardian states that patient has lived at [facility] for about 10 years and she loves it there. Guardian states that patient is expected to return to [facility] at discharge. CSW will follow for discharge planning.   Employment status:  Disabled (Comment on whether or not currently receiving Disability) Insurance information:  Medicare PT Recommendations:  Not assessed at this time Information / Referral to community resources:     Patient/Family's Response to care:  Guardian thanked CSW for assisting with discharge   Patient/Family's Understanding of and Emotional Response to Diagnosis, Current Treatment, and Prognosis:  Guardian states that  she understands current discharge plan.   Emotional Assessment Appearance:  Appears stated age Attitude/Demeanor/Rapport:    Affect (typically observed):  Accepting, Quiet Orientation:  Oriented to Self, Oriented to Place Alcohol / Substance use:  Not Applicable Psych involvement (Current and /or in the community):  No (Comment)  Discharge Needs  Concerns to be addressed:  Discharge Planning Concerns Readmission within the last 30 days:  No Current discharge risk:  None Barriers to Discharge:  Continued Medical Work up   [employer], [place] 08/10/2017, 11:51 AM

## 2017-08-10 NOTE — Progress Notes (Signed)
Pharmacy Antibiotic Note  [name] [name] is a 57 y.o. female admitted on 08/09/2017 with pneumonia.  Pharmacy has been consulted for vanc dosing. Patient received cefepime 1g, vanc 1g, and levaquin 750 mg IV x 1  Plan: Will continue vanc 1g IV q12h w/ 6 hour stack  Will draw vanc trough 07/17 @ 1800 prior to 4th dose. Will continue cefepime 1g IV q12h  Ke 0.0639 T1/2 10 ~ 12 hrs Goal trough 15 - 20 mcg/mL  Height: 5\' 6"  (167.6 cm) Weight: 141 lb 9.6 oz (64.2 kg) IBW/kg (Calculated) : 59.3  Temp (24hrs), Avg:98 F (36.7 C), Min:97.7 F (36.5 C), Max:98.2 F (36.8 C)  Recent Labs  Lab 08/09/17 1611 08/09/17 1658  WBC 10.9  --   CREATININE  --  0.53    Estimated Creatinine Clearance: 71.8 mL/min (by C-G formula based on SCr of 0.53 mg/dL).    Allergies  Allergen Reactions  . Dilantin [Phenytoin] Other (See Comments)    unknown  . Penicillins Other (See Comments)    .Has patient had a PCN reaction causing immediate rash, facial/tongue/throat swelling, SOB or lightheadedness with hypotension: Unknown Has patient had a PCN reaction causing severe rash involving mucus membranes or skin necrosis: Unknown Has patient had a PCN reaction that required hospitalization: Unknown Has patient had a PCN reaction occurring within the last 10 years: Unknown If all of the above answers are "NO", then may proceed with Cephalosporin use.   Marland Kitchen Phenytoin Sodium Extended Other (See Comments)    Other reaction(s): Other (See Comments)  . Phenytoin Sodium Extended Other (See Comments)    Other reaction(s): Other (See Comments)  . Sulfa Antibiotics Rash and Other (See Comments)    unknown    Thank you for allowing pharmacy to be a part of this patient's care.  Tobie Lords, PharmD, BCPS Clinical Pharmacist 08/10/2017
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
## 2017-08-10 NOTE — Progress Notes (Signed)
PT Cancellation Note  Patient Details Name: [name] [name] MRN: [id number] DOB: [birth year]   Cancelled Treatment:    Reason Eval/Treat Not Completed: Patient at procedure or test/unavailable.  Transport present to take pt off unit for testing.  Will re-attempt PT evaluation at a later date/time.  Leitha Bleak, PT 08/10/17, 11:31 AM [phone]

## 2017-08-10 NOTE — Progress Notes (Signed)
OT Cancellation Note  Patient Details Name: [name] [name] MRN: [id number] DOB: Jul 13, 1960   Cancelled Treatment:    Reason Eval/Treat Not Completed: Active bedrest order. Order received, chart reviewed. Pt with active bed rest order. Spoke with Financial controller to request order be discontinued by MD as appropriate. Will continue to follow acutely and re-attempt OT evaluation at later date/time as pt is medically appropriate.   Jeni Salles, MPH, MS, OTR/L ascom [phone] 08/10/17, 8:51 AM
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
## 2017-08-10 NOTE — NC FL2 (Addendum)
[place] LEVEL OF CARE SCREENING TOOL     IDENTIFICATION  Patient Name: [name] [name] Birthdate: January 27, 1960 Sex: female Admission Date (Current Location): 08/09/2017  [facility] and [place] Number:  Engineering geologist and Address:  [facility], [address], [place], [place] [address]      Provider Number: [id number]  Attending Physician Name and Address:  Saundra Shelling, MD  Relative Name and Phone Number:       Current Level of Care: Hospital Recommended Level of Care: SNF Prior Approval Number:    Date Approved/Denied:   PASRR Number:    Discharge Plan: SNF   Current Diagnoses: Patient Active Problem List   Diagnosis Date Noted  . CVA (cerebral vascular accident) ([place]) 08/09/2017  . Chronic gastritis 06/09/2017  . HTN (hypertension) 04/08/2017  . 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[place]) 04/08/2017  . PVD (peripheral vascular disease) ([place]) 04/08/2017  . Barrett's esophagus without dysplasia 04/08/2017  . Senile purpura ([place]) 04/08/2017  . Traumatic brain injury ([place]) 12/03/2014  . Chronic constipation 08/23/2014  . Major depression, recurrent, chronic ([place]) 08/23/2014  . Diabetes mellitus, type 2 ([place]) 08/23/2014  . OP (osteoporosis) 08/23/2014  . HLD (hyperlipidemia) 08/23/2014  . Cognitive impairment 08/23/2014    Orientation RESPIRATION BLADDER Height & Weight     Self, Place  O2(1 liter ) Incontinent Weight: 141 lb 9.6 oz (64.2 kg) Height:  5\' 6"  (167.6 cm)  BEHAVIORAL SYMPTOMS/MOOD NEUROLOGICAL BOWEL NUTRITION STATUS  (none ) (None ) Continent Diet(NPO to be advanced )  AMBULATORY STATUS COMMUNICATION OF NEEDS Skin   Extensive Assistance  Verbally Normal                       Personal Care Assistance Level of Assistance  Bathing, Feeding, Dressing Bathing Assistance: Limited assistance Feeding Assistance: Limited Assistance  Dressing Assistance: Independent     Functional Limitations Info  Sight, Hearing,  Speech Sight Info: Adequate Hearing Info: Adequate Speech Info: Adequate    SPECIAL CARE FACTORS FREQUENCY                       Contractures Contractures Info: Not present    Additional Factors Info  Code Status, Allergies Code Status Info: DNR Allergies Info: Dilantin Phenytoin, Penicillins, Phenytoin Sodium Extended, Phenytoin Sodium Extended, Sulfa Antibiotics           Current Medications (08/10/2017):  This is the current hospital active medication list Current Facility-Administered Medications  Medication Dose Route Frequency Provider Last Rate Last Dose  . acetaminophen (TYLENOL) tablet 650 mg  650 mg Oral Q4H PRN Salary, Montell D, MD       Or  . acetaminophen (TYLENOL) solution 650 mg  650 mg Per Tube Q4H PRN Salary, Montell D, MD       Or  . acetaminophen (TYLENOL) suppository 650 mg  650 mg Rectal Q4H PRN Salary, Montell D, MD      . albuterol (PROVENTIL) (2.5 MG/3ML) 0.083% nebulizer solution 2.5 mg  2.5 mg Inhalation Q4H PRN Salary, Montell D, MD   2.5 mg at 08/10/17 0906  . ceFEPIme (MAXIPIME) 1 g in sodium chloride 0.9 % 100 mL IVPB  1 g Intravenous Q12H Salary, Montell D, MD 200 mL/hr at 08/10/17 1050 1 g at 08/10/17 1050  . chlorhexidine (PERIDEX) 0.12 % solution 15 mL  15 mL Mouth Rinse BID Salary, Montell D, MD   15 mL at 08/10/17 1050  . cilostazol (PLETAL) tablet  50 mg  50 mg Oral BID Salary, Montell D, MD      . clopidogrel (PLAVIX) tablet 75 mg  75 mg Oral Daily Salary, Montell D, MD      . desmopressin (DDAVP) tablet 200 mcg  200 mcg Oral QHS Salary, Montell D, MD      . docusate sodium (COLACE) capsule 100 mg  100 mg Oral BID Salary, Montell D, MD      . donepezil (ARICEPT) tablet 10 mg  10 mg Oral Daily Salary, Montell D, MD      . enoxaparin (LOVENOX) injection 40 mg  40 mg Subcutaneous Q24H Salary, Montell D, MD   40 mg at 08/10/17 0125  . ferrous sulfate tablet 325 mg  325 mg Oral Daily Salary, Montell D, MD      . fesoterodine (TOVIAZ) tablet  4 mg  4 mg Oral Daily Salary, Montell D, MD      . FLUoxetine (PROZAC) capsule 10 mg  10 mg Oral Daily Salary, Montell D, MD      . insulin aspart (novoLOG) injection 0-15 Units  0-15 Units Subcutaneous TID WC Salary, Avel [place], MD   3 Units at 08/10/17 0825  . insulin aspart (novoLOG) injection 0-5 Units  0-5 Units Subcutaneous QHS Salary, Montell D, MD      . ketoconazole (NIZORAL) 2 % cream 1 application  1 application Topical Daily Salary, Montell D, MD   1 application at 62/94/76 1050  . lurasidone (LATUDA) tablet 20 mg  20 mg Oral Daily Salary, Montell D, MD      . MEDLINE mouth rinse  15 mL Mouth Rinse q12n4p Salary, Montell D, MD      . memantine (NAMENDA XR) 24 hr capsule 28 mg  28 mg Oral Daily Salary, Montell D, MD      . metoprolol tartrate (LOPRESSOR) injection 5 mg  5 mg Intravenous Q8H Pyreddy, Pavan, MD      . phenol (CHLORASEPTIC) mouth spray 1 spray  1 spray Mouth/Throat PRN Pyreddy, Pavan, MD      . ramipril (ALTACE) capsule 10 mg  10 mg Oral Daily Salary, Montell D, MD      . senna-docusate (Senokot-S) tablet 1 tablet  1 tablet Oral QHS PRN Salary, Montell D, MD      . simvastatin (ZOCOR) tablet 40 mg  40 mg Oral QHS Salary, Montell D, MD      . vancomycin (VANCOCIN) IVPB 1000 mg/200 mL premix  1,000 mg Intravenous Q12H Salary, Avel [place], MD   Stopped at 08/10/17 5465     Discharge Medications: Please see discharge summary for a list of discharge medications.  Relevant Imaging Results:  Relevant Lab Results:   Additional Information    Khadijah Mastrianni  Louretta Shorten, LCSWA

## 2017-08-11 ENCOUNTER — Inpatient Hospital Stay: Payer: Medicare Other

## 2017-08-11 ENCOUNTER — Ambulatory Visit: Payer: Medicare Other | Admitting: Family Medicine

## 2017-08-11 LAB — GLUCOSE, CAPILLARY
GLUCOSE-CAPILLARY: 134 mg/dL — AB (ref 70–99)
GLUCOSE-CAPILLARY: 96 mg/dL (ref 70–99)
Glucose-Capillary: 130 mg/dL — ABNORMAL HIGH (ref 70–99)
Glucose-Capillary: 105 mg/dL — ABNORMAL HIGH (ref 70–99)

## 2017-08-11 LAB — HIV ANTIBODY (ROUTINE TESTING W REFLEX): HIV Screen 4th Generation wRfx: NONREACTIVE

## 2017-08-11 LAB — HEMOGLOBIN A1C
HEMOGLOBIN A1C: 6.2 % — AB (ref 4.8–5.6)
MEAN PLASMA GLUCOSE: 131 mg/dL

## 2017-08-11 MED ORDER — SODIUM CHLORIDE 0.9 % IV SOLN
1.0000 g | INTRAVENOUS | Status: DC
  Administered 2017-08-11: 18:00:00 1 g via INTRAVENOUS
  Filled 2017-08-11: qty 10
  Filled 2017-08-11: qty 1
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
## 2017-08-11 NOTE — Progress Notes (Signed)
[place] [facility] [employer] NAME: Kanylah Muench    MR#:  [id number]  DATE OF BIRTH:  [birth year]  SUBJECTIVE:  CHIEF COMPLAINT:   Chief Complaint  Patient presents with  . Weakness  Patient seen and evaluated today Awake and lying on the bed Patient is alert and responds to verbal commands Failed modified barium swallow study  REVIEW OF SYSTEMS:    ROS Unable to obtain completely secondary to organic brain disease  DRUG ALLERGIES:   Allergies  Allergen Reactions  . Dilantin [Phenytoin] Other (See Comments)    unknown  . Penicillins Other (See Comments)    .Has patient had a PCN reaction causing immediate rash, facial/tongue/throat swelling, SOB or lightheadedness with hypotension: Unknown Has patient had a PCN reaction causing severe rash involving mucus membranes or skin necrosis: Unknown Has patient had a PCN reaction that required hospitalization: Unknown Has patient had a PCN reaction occurring within the last 10 years: Unknown If all of the above answers are "NO", then may proceed with Cephalosporin use.   Marland Kitchen Phenytoin Sodium Extended Other (See Comments)    Other reaction(s): Other (See Comments)  . Phenytoin Sodium Extended Other (See Comments)    Other reaction(s): Other (See Comments)  . Sulfa Antibiotics Rash and Other (See Comments)    unknown    VITALS:  Blood pressure (!) 152/78, pulse 69, temperature 99.4 F (37.4 C), temperature source Oral, resp. rate 17, height 5\' 6"  (1.676 m), weight 67.4 kg (148 lb 8 oz), SpO2 91 %.  PHYSICAL EXAMINATION:   Physical Exam  GENERAL:  57 y.o.-year-old patient lying in the bed with no acute distress.  EYES: Pupils equal, round, reactive to light and accommodation. No scleral icterus. Extraocular muscles intact.  HEENT: Head atraumatic, normocephalic. Oropharynx and nasopharynx clear.  NECK:  Supple, no jugular venous distention. No thyroid enlargement, no tenderness.  LUNGS: Normal  breath sounds bilaterally, no wheezing, rales, rhonchi. No use of accessory muscles of respiration.  CARDIOVASCULAR: S1, S2 normal. No murmurs, rubs, or gallops.  ABDOMEN: Soft, nontender, nondistended. Bowel sounds present. No organomegaly or mass.  EXTREMITIES: No cyanosis, clubbing or edema b/l.    NEUROLOGIC: Cranial nerves II through XII are intact. MAES - globally weak PSYCHIATRIC:  mood pleasant SKIN: No obvious rash, lesion, or ulcer.   LABORATORY PANEL:   CBC Recent Labs  Lab 08/09/17 1611  WBC 10.9  HGB 14.3  HCT 41.3  PLT 291   ------------------------------------------------------------------------------------------------------------------ Chemistries  Recent Labs  Lab 08/09/17 1658  NA 135  K 4.7  CL 97*  CO2 27  GLUCOSE 119*  BUN 12  CREATININE 0.53  CALCIUM 9.4  AST 24  ALT 16  ALKPHOS 113  BILITOT 0.7   ------------------------------------------------------------------------------------------------------------------  Cardiac Enzymes Recent Labs  Lab 08/09/17 1658  TROPONINI <0.03   ------------------------------------------------------------------------------------------------------------------  RADIOLOGY:  Dg Chest 2 View  Result Date: 08/09/2017 CLINICAL DATA:  Cough. EXAM: CHEST - 2 VIEW COMPARISON:  Chest x-ray dated February 21, 2017. FINDINGS: The heart size and mediastinal contours are within normal limits. Normal pulmonary vascularity. No focal consolidation, pleural effusion, or pneumothorax. No acute osseous abnormality. IMPRESSION: No active cardiopulmonary disease. Electronically Signed   By: Titus Dubin M.D.   On: 08/09/2017 16:53   Ct Head Wo Contrast  Result Date: 08/09/2017 CLINICAL DATA:  Right-sided weakness beginning sometime today. EXAM: CT HEAD WITHOUT CONTRAST TECHNIQUE: Contiguous axial images were obtained from the base of the skull through the vertex without  intravenous contrast. COMPARISON:  10/06/2012 FINDINGS: Brain:  No evidence of acute infarction, hemorrhage, hydrocephalus, extra-axial collection or mass lesion/mass effect. There is ventricular and sulcal enlargement reflecting atrophy, which is most severe involving the cerebellum. 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 Mild periventricular white matter hypoattenuation, also stable consistent with chronic microvascular ischemic change. Vascular: No hyperdense vessel or unexpected calcification. Skull: Normal. Negative for fracture or focal lesion. Sinuses/Orbits: Globes and orbits are unremarkable. Visualized sinuses and mastoid air cells are clear. Other: None. IMPRESSION: 1. No acute intracranial abnormalities. 2. Atrophy, small old frontal lobe infarct and chronic microvascular ischemic change stable from the prior exam. Electronically Signed   By: Lajean Manes M.D.   On: 08/09/2017 16:40   Ct Chest Wo Contrast  Result Date: 08/09/2017 CLINICAL DATA:  Patient coughing with wet sounding cough. EXAM: CT CHEST WITHOUT CONTRAST TECHNIQUE: Multidetector CT imaging of the chest was performed following the standard protocol without IV contrast. COMPARISON:  CXR 08/09/2017 FINDINGS: Cardiovascular: Nonaneurysmal atherosclerotic aorta. Mild cardiomegaly with trace pericardial effusion. The unenhanced pulmonary vessels are unremarkable. Mediastinum/Nodes: No enlarged mediastinal or axillary lymph nodes. Thyroid gland and esophagus demonstrate no significant findings. Lungs/Pleura: Bibasilar dependent atelectasis. There are faint soft tissue densities noted within the central airway and distal trachea that could potentially represent mucus or debris though assessment is limited by motion. No pulmonary consolidation or pneumothorax. No dominant mass. Bronchitic change to both lower lobes and right middle lobe with peribronchial thickening. Upper Abdomen: Cholecystectomy.  No acute abnormality. Musculoskeletal: Lower cervical spondylosis with marked disc space  narrowing, endplate spurring and disc flattening. No acute osseous abnormality. Lesser degree of degenerative change along the thoracic spine. IMPRESSION: 1. Bibasilar dependent atelectasis. 2. Faint soft tissue densities in the trachea, mainstem bronchi and right lower lobe bronchi may reflect soft tissue debris and/or mucous. Bronchitic change is identified both lower lobes and right middle lobe. 3. Cardiomegaly with trace pericardial effusion. 4. Spondylosis of the included lower cervical spine. Lesser degree of degenerative change along the thoracic spine. Aortic Atherosclerosis (ICD10-I70.0). Electronically Signed   By: Ashley Royalty M.D.   On: 08/09/2017 19:25   Mr Brain Wo Contrast  Result Date: 08/10/2017 CLINICAL DATA:  57 year old female with recent onset unexplained right side weakness, began dragging right leg. History of remote traumatic brain injury with cognitive deficits. EXAM: MRI HEAD WITHOUT CONTRAST MRA HEAD WITHOUT CONTRAST TECHNIQUE: Multiplanar, multiecho pulse sequences of the brain and surrounding structures were obtained without intravenous contrast. Angiographic images of the head were obtained using MRA technique without contrast. COMPARISON:  Head CT without contrast 08/09/2017. Brain MRI 08/04/2012. cervical spine CT 09/15/2010. FINDINGS: MRI HEAD FINDINGS Brain: No restricted diffusion or evidence of acute infarction. Pronounced chronic encephalomalacia in the brainstem and cerebellum appear stable since 2014. Suspected chronic blood products in the midbrain on the left where Wallerian degeneration is more pronounced. Chronic encephalomalacia in both anterior superior frontal gyri with regional white matter gliosis. Chronic patchy white matter T2 and FLAIR hyperintensity in the left inferior frontal gyrus and right corona radiata. Scattered small nonspecific foci of cerebral white matter T2 and FLAIR hyperintensity elsewhere are stable to mildly increased since 2014. Ex vacuo  ventricular enlargement has mildly increased since 2014. No midline shift, mass effect, evidence of mass lesion, extra-axial collection or acute intracranial hemorrhage. Cervicomedullary junction and pituitary are within normal limits. Vascular: Major intracranial vascular flow voids are stable since 2014 and preserved. Skull and upper cervical spine: Stable visible cervical spine with  chronic degenerative C4 level spinal stenosis (series 10, image 13), also visible on the 2012 cervical spine CT. Sinuses/Orbits: Stable and negative. Other: Mastoid air cells are clear. Visible internal auditory structures appear stable. Scalp and face soft tissues appear negative. MRA HEAD FINDINGS Antegrade flow in the posterior circulation with mildly dominant distal left vertebral artery. The left PICA origin is patent. The right AICA is patent and may be dominant. No distal vertebral or basilar stenosis. Fetal type bilateral PCA origins, more so the right. Patent SCA origins. Bilateral PCA branches are within normal limits allowing for mild motion artifact. Antegrade flow in both ICA siphons. No siphon stenosis. Normal ophthalmic and posterior communicating artery origins. Normal carotid termini, MCA and ACA origins. The left A1 is mildly dominant. Anterior communicating artery is within normal limits. Bilateral MCA M1 segments, MCA bifurcations, and visible bilateral MCA and ACA branches are within normal limits allowing for mild motion artifact. IMPRESSION: 1.  No acute intracranial abnormality. 2. Largely unchanged noncontrast MRI appearance of the brain since 2014: - chronic severe brainstem and cerebellar encephalomalacia with left midbrain hemosiderin. - chronic anterior superior frontal gyri encephalomalacia. - ex vacuo ventricular enlargement. - mild additional nonspecific cerebral white matter signal changes which could be posttraumatic or post ischemic. 3. Chronic C4 level cervical spinal stenosis. 4.  Negative  intracranial MRA. Electronically Signed   By: Genevie Ann M.D.   On: 08/10/2017 05:42   US Carotid Bilateral (at [facility] Only)  Result Date: 08/10/2017 CLINICAL DATA:  CVA. History of hypertension, hyperlipidemia and diabetes. Former smoker. EXAM: BILATERAL CAROTID DUPLEX ULTRASOUND TECHNIQUE: Pearline Cables scale imaging, color Doppler and duplex ultrasound were performed of bilateral carotid and vertebral arteries in the neck. COMPARISON:  None. FINDINGS: Criteria: Quantification of carotid stenosis is based on velocity parameters that correlate the residual internal carotid diameter with NASCET-based stenosis levels, using the diameter of the distal internal carotid lumen as the denominator for stenosis measurement. The following velocity measurements were obtained: RIGHT ICA:  74/22 cm/sec CCA:  01/60 cm/sec SYSTOLIC ICA/CCA RATIO:  1.0 ECA:  72 cm/sec LEFT ICA:  83/21 cm/sec CCA:  10/93 cm/sec SYSTOLIC ICA/CCA RATIO:  1.1 ECA:  87 cm/sec RIGHT CAROTID ARTERY: There is a minimal amount of echogenic plaque within the right carotid bulb (image 15), extending to involve the origin and proximal aspects of the right internal carotid artery (image 22), not resulting in elevated peak systolic velocities within the interrogated course the right internal carotid artery to suggest a hemodynamically significant stenosis. RIGHT VERTEBRAL ARTERY:  Antegrade flow LEFT CAROTID ARTERY: There is a moderate to large amount of echogenic plaque within the left carotid bulb (image 47), extending to involve the origin and proximal aspects of the left internal carotid artery (image 54), not resulting in elevated peak systolic velocities within the interrogated course the left internal carotid artery to suggest a hemodynamically significant stenosis. LEFT VERTEBRAL ARTERY:  Antegrade flow IMPRESSION: 1. Moderate to large amount of left-sided atherosclerotic plaque, not resulting in a hemodynamically significant stenosis. 2. Minimal amount of  right-sided atherosclerotic plaque, not resulting in a hemodynamically significant stenosis. Electronically Signed   By: Sandi Mariscal M.D.   On: 08/10/2017 12:24   Mr Jodene Nam Head/brain AT Cm  Result Date: 08/10/2017 CLINICAL DATA:  57 year old female with recent onset unexplained right side weakness, began dragging right leg. History of remote traumatic brain injury with cognitive deficits. EXAM: MRI HEAD WITHOUT CONTRAST MRA HEAD WITHOUT CONTRAST TECHNIQUE: Multiplanar, multiecho pulse sequences of  the brain and surrounding structures were obtained without intravenous contrast. Angiographic images of the head were obtained using MRA technique without contrast. COMPARISON:  Head CT without contrast 08/09/2017. Brain MRI 08/04/2012. cervical spine CT 09/15/2010. FINDINGS: MRI HEAD FINDINGS Brain: No restricted diffusion or evidence of acute infarction. Pronounced chronic encephalomalacia in the brainstem and cerebellum appear stable since 2014. Suspected chronic blood products in the midbrain on the left where Wallerian degeneration is more pronounced. Chronic encephalomalacia in both anterior superior frontal gyri with regional white matter gliosis. Chronic patchy white matter T2 and FLAIR hyperintensity in the left inferior frontal gyrus and right corona radiata. Scattered small nonspecific foci of cerebral white matter T2 and FLAIR hyperintensity elsewhere are stable to mildly increased since 2014. Ex vacuo ventricular enlargement has mildly increased since 2014. No midline shift, mass effect, evidence of mass lesion, extra-axial collection or acute intracranial hemorrhage. Cervicomedullary junction and pituitary are within normal limits. Vascular: Major intracranial vascular flow voids are stable since 2014 and preserved. Skull and upper cervical spine: Stable visible cervical spine with chronic degenerative C4 level spinal stenosis (series 10, image 13), also visible on the 2012 cervical spine CT.  Sinuses/Orbits: Stable and negative. Other: Mastoid air cells are clear. Visible internal auditory structures appear stable. Scalp and face soft tissues appear negative. MRA HEAD FINDINGS Antegrade flow in the posterior circulation with mildly dominant distal left vertebral artery. The left PICA origin is patent. The right AICA is patent and may be dominant. No distal vertebral or basilar stenosis. Fetal type bilateral PCA origins, more so the right. Patent SCA origins. Bilateral PCA branches are within normal limits allowing for mild motion artifact. Antegrade flow in both ICA siphons. No siphon stenosis. Normal ophthalmic and posterior communicating artery origins. Normal carotid termini, MCA and ACA origins. The left A1 is mildly dominant. Anterior communicating artery is within normal limits. Bilateral MCA M1 segments, MCA bifurcations, and visible bilateral MCA and ACA branches are within normal limits allowing for mild motion artifact. IMPRESSION: 1.  No acute intracranial abnormality. 2. Largely unchanged noncontrast MRI appearance of the brain since 2014: - chronic severe brainstem and cerebellar encephalomalacia with left midbrain hemosiderin. - chronic anterior superior frontal gyri encephalomalacia. - ex vacuo ventricular enlargement. - mild additional nonspecific cerebral white matter signal changes which could be posttraumatic or post ischemic. 3. Chronic C4 level cervical spinal stenosis. 4.  Negative intracranial MRA. Electronically Signed   By: Genevie Ann M.D.   On: 08/10/2017 05:42     ASSESSMENT AND PLAN:   57 year old female patient with history of traumatic brain injury, organic brain disease, diabetes mellitus type 2 currently under hospitalist service for generalized weakness  -Acute urinary tract infection secondary to Klebsiella pneumonia Discontinue IV cefepime Start patient on IV Rocephin antibiotic based on culture and sensitivities  -Dysphagia Modified barium swallow study   will discuss with patient's family regarding other options of nutrition chest PEG feeding  -Global weakness  CVA ruled out  imaging studies were negative for any stroke  -Diabetes mellitus type 2 Medical management and sliding scale insulin  All the records are reviewed and case discussed with Care Management/Social Worker. Management plans discussed with the patient, family and they are in agreement.  CODE STATUS:DNR  DVT Prophylaxis: SCDs  TOTAL TIME TAKING CARE OF THIS PATIENT: 33 minutes.   POSSIBLE D/C IN 2 to 3 DAYS, DEPENDING ON CLINICAL CONDITION.  Saundra Shelling M.D on 08/11/2017 at 4:00 PM  Between 7am to 6pm - Pager -  [phone]  After 6pm go to [email or web address] - password [email or web address] [facility]  Office  [phone]  CC: Primary care physician; Steele Sizer, MD  Note: This dictation was prepared with Dragon dictation along with smaller phrase technology. Any transcriptional errors that result from this process are unintentional.

## 2017-08-12 ENCOUNTER — Inpatient Hospital Stay: Payer: Medicare Other

## 2017-08-12 DIAGNOSIS — R4781 Slurred speech: Secondary | ICD-10-CM

## 2017-08-12 DIAGNOSIS — R1312 Dysphagia, oropharyngeal phase: Secondary | ICD-10-CM

## 2017-08-12 LAB — URINE CULTURE: Culture: >=100000 — AB

## 2017-08-12 LAB — BASIC METABOLIC PANEL
Anion gap: 8 (ref 5–15)
BUN: 6 mg/dL (ref 6–20)
CALCIUM: 8.7 mg/dL — AB (ref 8.9–10.3)
CO2: 26 mmol/L (ref 22–32)
CREATININE: 0.6 mg/dL (ref 0.44–1.00)
Chloride: 107 mmol/L (ref 98–111)
GFR calc Af Amer: >60 mL/min (ref >60)
GFR calc non Af Amer: >60 mL/min (ref >60)
GLUCOSE: 117 mg/dL — AB (ref 70–99)
Potassium: 3.6 mmol/L (ref 3.5–5.1)
Sodium: 141 mmol/L (ref 135–145)

## 2017-08-12 LAB — GLUCOSE, CAPILLARY
GLUCOSE-CAPILLARY: 114 mg/dL — AB (ref 70–99)
GLUCOSE-CAPILLARY: 133 mg/dL — AB (ref 70–99)
Glucose-Capillary: 108 mg/dL — ABNORMAL HIGH (ref 70–99)
Glucose-Capillary: 119 mg/dL — ABNORMAL HIGH (ref 70–99)
Glucose-Capillary: 172 mg/dL — ABNORMAL HIGH (ref 70–99)

## 2017-08-12 MED ORDER — JEVITY 1.5 CAL/FIBER PO LIQD
1000.0000 mL | ORAL | Status: DC
  Administered 2017-08-13 – 2017-08-16 (×4): 1000 mL

## 2017-08-12 MED ORDER — DESMOPRESSIN ACETATE 0.2 MG PO TABS
200.0000 ug | ORAL_TABLET | Freq: Every day | ORAL | Status: DC
Start: 2017-08-13 — End: 2017-08-18
  Administered 2017-08-13 – 2017-08-17 (×5): 200 ug
  Filled 2017-08-12 (×5): qty 1

## 2017-08-12 MED ORDER — VITAMIN C 500 MG PO TABS
250.0000 mg | ORAL_TABLET | Freq: Two times a day (BID) | ORAL | Status: DC
  Administered 2017-08-13 – 2017-08-17 (×10): 250 mg
  Filled 2017-08-12 (×12): qty 0.5

## 2017-08-12 MED ORDER — FREE WATER
180.0000 mL | Status: DC
  Administered 2017-08-13 – 2017-08-17 (×23): 180 mL

## 2017-08-12 MED ORDER — LURASIDONE HCL 40 MG PO TABS
20.0000 mg | ORAL_TABLET | Freq: Every day | ORAL | Status: DC
Start: 2017-08-13 — End: 2017-08-18
  Administered 2017-08-13 – 2017-08-17 (×5): 20 mg
  Filled 2017-08-12 (×6): qty 1

## 2017-08-12 MED ORDER — FLUOXETINE HCL 20 MG/5ML PO SOLN
10.0000 mg | Freq: Every day | ORAL | Status: DC
  Administered 2017-08-13 – 2017-08-17 (×5): 10 mg
  Filled 2017-08-12 (×7): qty 5

## 2017-08-12 MED ORDER — SENNOSIDES-DOCUSATE SODIUM 8.6-50 MG PO TABS: 1.0000 | ORAL_TABLET | Freq: Every evening | ORAL | Status: DC | PRN

## 2017-08-12 MED ORDER — DOCUSATE SODIUM 50 MG/5ML PO LIQD
100.0000 mg | Freq: Two times a day (BID) | ORAL | Status: DC
  Administered 2017-08-13 – 2017-08-17 (×8): 100 mg
  Filled 2017-08-12 (×12): qty 10

## 2017-08-12 MED ORDER — JEVITY 1.2 CAL PO LIQD: 1000.0000 mL | ORAL | Status: DC

## 2017-08-12 MED ORDER — CEFTRIAXONE SODIUM 1 G IJ SOLR
1.0000 g | INTRAMUSCULAR | Status: AC
  Administered 2017-08-12 – 2017-08-14 (×3): 1 g via INTRAVENOUS
  Filled 2017-08-12 (×3): qty 1

## 2017-08-12 MED ORDER — SIMVASTATIN 20 MG PO TABS
40.0000 mg | ORAL_TABLET | Freq: Every day | ORAL | Status: DC
  Administered 2017-08-13 – 2017-08-17 (×5): 40 mg
  Filled 2017-08-12 (×5): qty 2

## 2017-08-12 MED ORDER — CEPHALEXIN 250 MG/5ML PO SUSR
500.0000 mg | Freq: Two times a day (BID) | ORAL | Status: DC
  Filled 2017-08-12 (×2): qty 10

## 2017-08-12 MED ORDER — DONEPEZIL HCL 5 MG PO TABS
10.0000 mg | ORAL_TABLET | Freq: Every day | ORAL | Status: DC
  Administered 2017-08-13 – 2017-08-18 (×6): 10 mg
  Filled 2017-08-12 (×6): qty 2

## 2017-08-12 MED ORDER — FERROUS SULFATE 300 (60 FE) MG/5ML PO SYRP
300.0000 mg | ORAL_SOLUTION | Freq: Every day | ORAL | Status: DC
  Filled 2017-08-12 (×3): qty 5

## 2017-08-12 MED ORDER — CILOSTAZOL 100 MG PO TABS
50.0000 mg | ORAL_TABLET | Freq: Two times a day (BID) | ORAL | Status: DC
  Administered 2017-08-13 – 2017-08-17 (×10): 50 mg
  Filled 2017-08-12 (×11): qty 0.5

## 2017-08-12 MED ORDER — RAMIPRIL 10 MG PO CAPS
10.0000 mg | ORAL_CAPSULE | Freq: Every day | ORAL | Status: DC
  Administered 2017-08-13 – 2017-08-17 (×5): 10 mg
  Filled 2017-08-12 (×6): qty 1

## 2017-08-12 NOTE — Progress Notes (Signed)
[place] [facility] [place] NAME: [name] [name]    MR#:  [id number]  DATE OF BIRTH:  1960/10/31  SUBJECTIVE:  CHIEF COMPLAINT:   Chief Complaint  Patient presents with  . Weakness  Patient seen and evaluated today Awake and sitting in the chair Failed barium swallow study Medications were tried to applesauce but patient has choking episodes  REVIEW OF SYSTEMS:    ROS Unable to obtain secondary to organic brain disease  DRUG ALLERGIES:   Allergies  Allergen Reactions  . Dilantin [Phenytoin] Other (See Comments)    unknown  . Penicillins Other (See Comments)    .Has patient had a PCN reaction causing immediate rash, facial/tongue/throat swelling, SOB or lightheadedness with hypotension: Unknown Has patient had a PCN reaction causing severe rash involving mucus membranes or skin necrosis: Unknown Has patient had a PCN reaction that required hospitalization: Unknown Has patient had a PCN reaction occurring within the last 10 years: Unknown If all of the above answers are "NO", then may proceed with Cephalosporin use.   Marland Kitchen Phenytoin Sodium Extended Other (See Comments)    Other reaction(s): Other (See Comments)  . Phenytoin Sodium Extended Other (See Comments)    Other reaction(s): Other (See Comments)  . Sulfa Antibiotics Rash and Other (See Comments)    unknown    VITALS:  Blood pressure (!) 141/88, pulse 67, temperature 97.8 F (36.6 C), temperature source Oral, resp. rate 20, height 5\' 6"  (1.676 m), weight 67.4 kg (148 lb 8 oz), SpO2 92 %.  PHYSICAL EXAMINATION:   Physical Exam  GENERAL:  57 y.o.-year-old patient lying in the bed with no acute distress.  EYES: Pupils equal, round, reactive to light and accommodation. No scleral icterus. Extraocular muscles intact.  HEENT: Head atraumatic, normocephalic. Oropharynx and nasopharynx clear.  NECK:  Supple, no jugular venous distention. No thyroid enlargement, no tenderness.  LUNGS:  Normal breath sounds bilaterally, no wheezing, rales, rhonchi. No use of accessory muscles of respiration.  CARDIOVASCULAR: S1, S2 normal. No murmurs, rubs, or gallops.  ABDOMEN: Soft, nontender, nondistended. Bowel sounds present. No organomegaly or mass.  EXTREMITIES: No cyanosis, clubbing or edema b/l.    NEUROLOGIC: Cranial nerves II through XII are intact. MAES - globally weak PSYCHIATRIC: slow mentation SKIN: No obvious rash, lesion, or ulcer.   LABORATORY PANEL:   CBC Recent Labs  Lab 08/09/17 1611  WBC 10.9  HGB 14.3  HCT 41.3  PLT 291   ------------------------------------------------------------------------------------------------------------------ Chemistries  Recent Labs  Lab 08/09/17 1658 08/12/17 0356  NA 135 141  K 4.7 3.6  CL 97* 107  CO2 27 26  GLUCOSE 119* 117*  BUN 12 6  CREATININE 0.53 0.60  CALCIUM 9.4 8.7*  AST 24  --   ALT 16  --   ALKPHOS 113  --   BILITOT 0.7  --    ------------------------------------------------------------------------------------------------------------------  Cardiac Enzymes Recent Labs  Lab 08/09/17 1658  TROPONINI <0.03   ------------------------------------------------------------------------------------------------------------------  RADIOLOGY:  No results found.   ASSESSMENT AND PLAN:   57 year old female patient with history of traumatic brain injury, organic brain disease, diabetes mellitus type 2 currently under hospitalist service for generalized weakness  -Klebsiella pneumonia urinary tract infection Discontinue IV cefepime antibiotic IV Rocephin antibiotic based on culture and sensitivity  -Dysphagia Gastroenterology consultation for possible PEG tube placement Family updated  -Global weakness  New onset CVA ruled out  imaging studies were negative for any stroke Supportive care  -Diabetes mellitus type 2 Medical  management and sliding scale insulin  All the records are reviewed and case  discussed with Care Management/Social Worker. Management plans discussed with the patient, family and they are in agreement.  CODE STATUS:DNR  DVT Prophylaxis: SCDs  TOTAL TIME TAKING CARE OF THIS PATIENT: 33 minutes.   POSSIBLE D/C IN 2 to 3 DAYS, DEPENDING ON CLINICAL CONDITION.  Saundra Shelling M.D on 08/12/2017 at 1:09 PM  Between 7am to 6pm - Pager - [phone]  After 6pm go to [email or web address] - password [email or web address] [facility]  Office  [phone]  CC: Primary care physician; Steele Sizer, MD  Note: This dictation was prepared with Dragon dictation along with smaller phrase technology. Any transcriptional errors that result from this process are unintentional.

## 2017-08-12 NOTE — Care Management Important Message (Signed)
Important Message  Patient Details  Name: [name] [name] MRN: [id number] Date of Birth: May 19, 1960   Medicare Important Message Given:  Yes    Juliann Pulse A Wilburta Milbourn 08/12/2017, 11:00 AM
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
## 2017-08-12 NOTE — Progress Notes (Signed)
Number 10 dobbhoff tube placed  Rt nostril  At  g 75. Order for xray  To verify placement entered.  Pt tof fair / coughed  For a while  After insertion.
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
## 2017-08-12 NOTE — Progress Notes (Signed)
Speech Therapy note: reviewed chart and consulted NSG. Spoke w/ POA Sister, Linwood Dibbles, re: pt's MBSS results and recommendation for consideration of options including alternative means of feeding d/t degree of oropharyngeal phase dysphagia. Questions answered. She will discuss situation further w/ MD, GI.  ST services will be available for further f/u and education while admitted. Recommend frequent oral care for hygiene and oral stimulation of swallowing. Pt does need full assistance w/ such.     Orinda Kenner, [place], CCC-SLP

## 2017-08-13 ENCOUNTER — Inpatient Hospital Stay: Payer: Medicare Other

## 2017-08-13 DIAGNOSIS — Z4659 Encounter for fitting and adjustment of other gastrointestinal appliance and device: Secondary | ICD-10-CM

## 2017-08-13 LAB — GLUCOSE, CAPILLARY
GLUCOSE-CAPILLARY: 129 mg/dL — AB (ref 70–99)
Glucose-Capillary: 93 mg/dL (ref 70–99)
Glucose-Capillary: 107 mg/dL — ABNORMAL HIGH (ref 70–99)

## 2017-08-13 MED ORDER — FERROUS SULFATE 220 (44 FE) MG/5ML PO ELIX
220.0000 mg | ORAL_SOLUTION | Freq: Every day | ORAL | Status: DC
  Administered 2017-08-14 – 2017-08-17 (×4): 220 mg
  Filled 2017-08-13 (×5): qty 5

## 2017-08-13 NOTE — Plan of Care (Signed)
  Problem: Education: Goal: Knowledge of General Education information will improve Outcome: Progressing   Problem: Health Behavior/Discharge Planning: Goal: Ability to manage health-related needs will improve Outcome: Progressing   Problem: Clinical Measurements: Goal: Ability to maintain clinical measurements within normal limits will improve Outcome: Progressing Goal: Will remain free from infection Outcome: Progressing Goal: Diagnostic test results will improve Outcome: Progressing Goal: Respiratory complications will improve Outcome: Progressing Goal: Cardiovascular complication will be avoided Outcome: Progressing   Problem: Activity: Goal: Risk for activity intolerance will decrease Outcome: Progressing   Problem: Coping: Goal: Level of anxiety will decrease Outcome: Progressing   Problem: Elimination: Goal: Will not experience complications related to bowel motility Outcome: Progressing Goal: Will not experience complications related to urinary retention Outcome: Progressing   Problem: Pain Managment: Goal: General experience of comfort will improve Outcome: Progressing   Problem: Safety: Goal: Ability to remain free from injury will improve Outcome: Progressing   Problem: Skin Integrity: Goal: Risk for impaired skin integrity will decrease Outcome: Progressing   Problem: Education: Goal: Knowledge of secondary prevention will improve Outcome: Progressing Goal: Knowledge of patient specific risk factors addressed and post discharge goals established will improve Outcome: Progressing

## 2017-08-13 NOTE — Clinical Social Work Note (Signed)
CSW spoke with patient's guardian Maximino Sarin [phone]. Sister still would like patient to return to group home but group home is unsure if they can provide the amount of care needed. Patient is still progressing and sister would like to wait to make a final decision about discharge until they decide on PEG tube. CSW will continue to follow for discharge planning.   [place], [place]

## 2017-08-13 NOTE — Progress Notes (Signed)
OT Cancellation Note  Patient Details Name: [name] [name] MRN: [id number] DOB: [birth year]   Cancelled Treatment:    Reason Eval/Treat Not Completed: Other (comment). Upon attempt to treat, pt with CNA for pt care. Will re-attempt next date as pt is available.  Jeni Salles, MPH, MS, OTR/L ascom [phone] 08/13/17, 3:35 PM

## 2017-08-13 NOTE — Plan of Care (Signed)
  Problem: Education: Goal: Knowledge of General Education information will improve Outcome: Progressing   Problem: Health Behavior/Discharge Planning: Goal: Ability to manage health-related needs will improve Outcome: Progressing   Problem: Clinical Measurements: Goal: Ability to maintain clinical measurements within normal limits will improve Outcome: Progressing Goal: Will remain free from infection Outcome: Progressing Goal: Diagnostic test results will improve Outcome: Progressing Goal: Respiratory complications will improve Outcome: Progressing   Problem: Activity: Goal: Risk for activity intolerance will decrease Outcome: Progressing   Problem: Nutrition: Goal: Adequate nutrition will be maintained Outcome: Not Progressing   Problem: Elimination: Goal: Will not experience complications related to bowel motility Outcome: Progressing Goal: Will not experience complications related to urinary retention Outcome: Progressing   Problem: Pain Managment: Goal: General experience of comfort will improve Outcome: Progressing   Problem: Skin Integrity: Goal: Risk for impaired skin integrity will decrease Outcome: Progressing
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
## 2017-08-13 NOTE — Progress Notes (Signed)
[facility] [place] NAME: [name] [name]    MR#:  [id number]  DATE OF BIRTH:  11/22/1960  SUBJECTIVE:  CHIEF COMPLAINT:   Chief Complaint  Patient presents with  . Weakness  Patient seen and evaluated today Awake and sitting in the chair Failed barium swallow study She had pulled out Dobbhoff tube yesterday Status post gastroenterology evaluation  REVIEW OF SYSTEMS:    ROS Unable to obtain secondary to organic brain disease  DRUG ALLERGIES:   Allergies  Allergen Reactions  . Dilantin [Phenytoin] Other (See Comments)    unknown  . Penicillins Other (See Comments)    .Has patient had a PCN reaction causing immediate rash, facial/tongue/throat swelling, SOB or lightheadedness with hypotension: Unknown Has patient had a PCN reaction causing severe rash involving mucus membranes or skin necrosis: Unknown Has patient had a PCN reaction that required hospitalization: Unknown Has patient had a PCN reaction occurring within the last 10 years: Unknown If all of the above answers are "NO", then may proceed with Cephalosporin use.   Marland Kitchen Phenytoin Sodium Extended Other (See Comments)    Other reaction(s): Other (See Comments)  . Phenytoin Sodium Extended Other (See Comments)    Other reaction(s): Other (See Comments)  . Sulfa Antibiotics Rash and Other (See Comments)    unknown    VITALS:  Blood pressure (!) 149/78, pulse 65, temperature 98.6 F (37 C), temperature source Oral, resp. rate 16, height 5\' 6"  (1.676 m), weight 67 kg (147 lb 11 oz), SpO2 95 %.  PHYSICAL EXAMINATION:   Physical Exam  GENERAL:  57 y.o.-year-old patient lying in the bed with no acute distress.  EYES: Pupils equal, round, reactive to light and accommodation. No scleral icterus. Extraocular muscles intact.  HEENT: Head atraumatic, normocephalic. Oropharynx and nasopharynx clear.  NECK:  Supple, no jugular venous distention. No thyroid enlargement, no tenderness.   LUNGS: Normal breath sounds bilaterally, no wheezing, rales, rhonchi. No use of accessory muscles of respiration.  CARDIOVASCULAR: S1, S2 normal. No murmurs, rubs, or gallops.  ABDOMEN: Soft, nontender, nondistended. Bowel sounds present. No organomegaly or mass.  EXTREMITIES: No cyanosis, clubbing or edema b/l.    NEUROLOGIC: Cranial nerves II through XII are intact. MAES - globally weak PSYCHIATRIC: slow mentation SKIN: No obvious rash, lesion, or ulcer.   LABORATORY PANEL:   CBC Recent Labs  Lab 08/09/17 1611  WBC 10.9  HGB 14.3  HCT 41.3  PLT 291   ------------------------------------------------------------------------------------------------------------------ Chemistries  Recent Labs  Lab 08/09/17 1658 08/12/17 0356  NA 135 141  K 4.7 3.6  CL 97* 107  CO2 27 26  GLUCOSE 119* 117*  BUN 12 6  CREATININE 0.53 0.60  CALCIUM 9.4 8.7*  AST 24  --   ALT 16  --   ALKPHOS 113  --   BILITOT 0.7  --    ------------------------------------------------------------------------------------------------------------------  Cardiac Enzymes Recent Labs  Lab 08/09/17 1658  TROPONINI <0.03   ------------------------------------------------------------------------------------------------------------------  RADIOLOGY:  Dg Abd 1 View  Result Date: 08/12/2017 CLINICAL DATA:  Nasogastric tube placement. EXAM: ABDOMEN - 1 VIEW COMPARISON:  Abdominal radiograph August 12, 2017 at 2008 hours FINDINGS: Redirected nasogastric tube with distal tip projecting in distribution of mid duodenum. Flocculated density in the colon. Included bowel gas pattern is nondilated and nonobstructive. Surgical clips in the included right abdomen compatible with cholecystectomy. IMPRESSION: Nasogastric tube tip projects in duodenum.  The Electronically Signed   By: Elon Alas M.D.   On:  08/12/2017 22:57   Dg Abd Portable 1v  Result Date: 08/13/2017 CLINICAL DATA:  Feeding tube placement.  Ex-smoker.  EXAM: PORTABLE ABDOMEN - 1 VIEW COMPARISON:  08/12/2017. FINDINGS: Feeding tube tip in the region of the gastric pylorus. Cholecystectomy clips. Normal bowel gas pattern. The included lungs are clear with mild diffuse peribronchial thickening. Mild thoracolumbar scoliosis. Mid and lower lumbar spine degenerative changes. IMPRESSION: 1. Feeding tube tip in the region of the gastric pylorus. 2. Mild chronic bronchitic changes. Electronically Signed   By: Claudie Revering M.D.   On: 08/13/2017 13:49   Dg Abd Portable 1v  Result Date: 08/12/2017 CLINICAL DATA:  NG tube placement. EXAM: PORTABLE ABDOMEN - 1 VIEW COMPARISON:  None. FINDINGS: The tip of a weighted feeding tube projects over the right lung base and is believed to be endobronchial in location. Withdrawal of the tube and repositioning is recommended. Heart and mediastinal contours are normal. Moderate stool burden is seen within the right colon. Cholecystectomy clips are present. IMPRESSION: Feeding tube along the distal right mainstem bronchus. Withdrawal of the tube and repositioning is recommended. These results will be called to the ordering clinician or representative by the Radiologist Assistant, and communication documented in the PACS or zVision Dashboard. Electronically Signed   By: Ashley Royalty M.D.   On: 08/12/2017 20:52     ASSESSMENT AND PLAN:   57 year old female patient with history of traumatic brain injury, organic brain disease, diabetes mellitus type 2 currently under hospitalist service for generalized weakness  -Klebsiella pneumonia urinary tract infection IV Rocephin antibiotic based on culture and sensitivity  -Dysphagia Dobbhoff tube again reinserted Feeding via Dobbhoff tube based on dietary recommendations  status post gastroenterology evaluation  -Global weakness  New onset CVA ruled out  imaging studies were negative for any stroke Supportive care  -Diabetes mellitus type 2 Medical management and sliding scale  insulin  All the records are reviewed and case discussed with Care Management/Social Worker. Management plans discussed with the patient, family and they are in agreement.  CODE STATUS:DNR  DVT Prophylaxis: SCDs  TOTAL TIME TAKING CARE OF THIS PATIENT: 34 minutes.   POSSIBLE D/C IN 2 to 3 DAYS, DEPENDING ON CLINICAL CONDITION.  Saundra Shelling M.D on 08/13/2017 at 2:25 PM  Between 7am to 6pm - Pager - [phone]  After 6pm go to [email or web address] - password [email or web address] [facility]  Office  [phone]  CC: Primary care physician; Steele Sizer, MD  Note: This dictation was prepared with Dragon dictation along with smaller phrase technology. Any transcriptional errors that result from this process are unintentional.

## 2017-08-13 NOTE — Progress Notes (Signed)
[name] Antigua, MD [address], [place], [place], [place], [address] [address], [place], [place], [place], [address] Phone: [phone]  Fax: [phone]   Subjective: Pt. Pulled out Dobhoff tube and it was replaced.    Objective: Exam: Vital signs in last 24 hours: Vitals:   08/12/17 1552 08/12/17 2353 08/13/17 0500 08/13/17 0639  BP: 130/86 (!) 151/77 (!) 148/80 (!) 149/78  Pulse: 84 73 70 65  Resp: 16 18 16    Temp: 98.4 F (36.9 C) 98.2 F (36.8 C) 98.6 F (37 C)   TempSrc: Oral Oral Oral   SpO2:  94% 96% 95%  Weight:   147 lb 11 oz (67 kg)   Height:       Weight change:   Intake/Output Summary (Last 24 hours) at 08/13/2017 1423 Last data filed at 08/13/2017 0448 Gross per 24 hour  Intake -  Output 1100 ml  Net -1100 ml    General: No acute distress, AAO x3 Abd: Soft, NT/ND, No HSM Skin: Warm, no rashes Neck: Supple, Trachea midline   Lab Results: Lab Results  Component Value Date   WBC 10.9 08/09/2017   HGB 14.3 08/09/2017   HCT 41.3 08/09/2017   MCV 88.0 08/09/2017   PLT 291 08/09/2017   Micro Results: Recent Results (from the past 240 hour(s))  Urine Culture     Status: Abnormal   Collection Time: 08/09/17  4:58 PM  Result Value Ref Range Status   Specimen Description   Final    URINE, RANDOM Performed at [facility], [address] [place], [place] [address]    Special Requests   Final    NONE Performed at [facility], [address] [place], [place] [address]    Culture >=100,000 COLONIES/mL KLEBSIELLA PNEUMONIAE (A)  Final   Report Status 08/12/2017 FINAL  Final   Organism ID, Bacteria KLEBSIELLA PNEUMONIAE (A)  Final      Susceptibility   Klebsiella pneumoniae - MIC*    AMPICILLIN RESISTANT Resistant     CEFAZOLIN <=4 SENSITIVE Sensitive     CEFTRIAXONE <=1 SENSITIVE Sensitive     CIPROFLOXACIN <=0.25 SENSITIVE Sensitive     GENTAMICIN <=1 SENSITIVE Sensitive     IMIPENEM <=0.25 SENSITIVE  Sensitive     NITROFURANTOIN 64 INTERMEDIATE Intermediate     TRIMETH/SULFA <=20 SENSITIVE Sensitive     AMPICILLIN/SULBACTAM <=2 SENSITIVE Sensitive     PIP/TAZO <=4 SENSITIVE Sensitive     Extended ESBL NEGATIVE Sensitive     * >=100,000 COLONIES/mL KLEBSIELLA PNEUMONIAE  Culture, blood (routine x 2) Call MD if unable to obtain prior to antibiotics being given     Status: None (Preliminary result)   Collection Time: 08/09/17 11:36 PM  Result Value Ref Range Status   Specimen Description BLOOD BLOOD RIGHT FOREARM  Final   Special Requests   Final    BOTTLES DRAWN AEROBIC AND ANAEROBIC Blood Culture adequate volume   Culture   Final    NO GROWTH 4 DAYS Performed at [facility], [address], [place], [place] [address]    Report Status PENDING  Incomplete  Culture, blood (routine x 2) Call MD if unable to obtain prior to antibiotics being given     Status: None (Preliminary result)   Collection Time: 08/09/17 11:42 PM  Result Value Ref Range Status   Specimen Description BLOOD BLOOD LEFT FOREARM  Final   Special Requests   Final    BOTTLES DRAWN AEROBIC AND ANAEROBIC Blood  Culture adequate volume   Culture   Final    NO GROWTH 4 DAYS Performed at [facility], [address] [place], [place] [address]    Report Status PENDING  Incomplete  MRSA PCR Screening     Status: None   Collection Time: 08/10/17 12:20 AM  Result Value Ref Range Status   MRSA by PCR NEGATIVE NEGATIVE Final    Comment:        The GeneXpert MRSA Assay (FDA approved for NASAL specimens only), is one component of a comprehensive MRSA colonization surveillance program. It is not intended to diagnose MRSA infection nor to guide or monitor treatment for MRSA infections. Performed at [facility], [address] [place], [place] [address]    Studies/Results: Dg Abd 1 View  Result Date: 08/12/2017 CLINICAL DATA:  Nasogastric tube placement. EXAM: ABDOMEN - 1 VIEW  COMPARISON:  Abdominal radiograph August 12, 2017 at 2008 hours FINDINGS: Redirected nasogastric tube with distal tip projecting in distribution of mid duodenum. Flocculated density in the colon. Included bowel gas pattern is nondilated and nonobstructive. Surgical clips in the included right abdomen compatible with cholecystectomy. IMPRESSION: Nasogastric tube tip projects in duodenum.  The Electronically Signed   By: Elon Alas M.D.   On: 08/12/2017 22:57   Dg Abd Portable 1v  Result Date: 08/13/2017 CLINICAL DATA:  Feeding tube placement.  Ex-smoker. EXAM: PORTABLE ABDOMEN - 1 VIEW COMPARISON:  08/12/2017. FINDINGS: Feeding tube tip in the region of the gastric pylorus. Cholecystectomy clips. Normal bowel gas pattern. The included lungs are clear with mild diffuse peribronchial thickening. Mild thoracolumbar scoliosis. Mid and lower lumbar spine degenerative changes. IMPRESSION: 1. Feeding tube tip in the region of the gastric pylorus. 2. Mild chronic bronchitic changes. Electronically Signed   By: Claudie Revering M.D.   On: 08/13/2017 13:49   Dg Abd Portable 1v  Result Date: 08/12/2017 CLINICAL DATA:  NG tube placement. EXAM: PORTABLE ABDOMEN - 1 VIEW COMPARISON:  None. FINDINGS: The tip of a weighted feeding tube projects over the right lung base and is believed to be endobronchial in location. Withdrawal of the tube and repositioning is recommended. Heart and mediastinal contours are normal. Moderate stool burden is seen within the right colon. Cholecystectomy clips are present. IMPRESSION: Feeding tube along the distal right mainstem bronchus. Withdrawal of the tube and repositioning is recommended. These results will be called to the ordering clinician or representative by the Radiologist Assistant, and communication documented in the PACS or zVision Dashboard. Electronically Signed   By: Ashley Royalty M.D.   On: 08/12/2017 20:52   Medications:  Scheduled Meds: . chlorhexidine  15 mL Mouth Rinse  BID  . cilostazol  50 mg Per Tube BID  . desmopressin  200 mcg Per Tube QHS  . docusate  100 mg Per Tube BID  . donepezil  10 mg Per Tube Daily  . enoxaparin (LOVENOX) injection  40 mg Subcutaneous Q24H  . ferrous sulfate  300 mg Per Tube Q breakfast  . fesoterodine  4 mg Oral Daily  . FLUoxetine  10 mg Per Tube Daily  . free water  180 mL Per Tube Q4H  . insulin aspart  0-15 Units Subcutaneous TID WC  . insulin aspart  0-5 Units Subcutaneous QHS  . ketoconazole  1 application Topical Daily  . lurasidone  20 mg Per Tube Daily  . mouth rinse  15 mL Mouth Rinse q12n4p  . memantine  28 mg Oral Daily  . metoprolol  tartrate  5 mg Intravenous Q8H  . ramipril  10 mg Per Tube Daily  . simvastatin  40 mg Per Tube QHS  . vitamin C  250 mg Per Tube BID   Continuous Infusions: . cefTRIAXone (ROCEPHIN)  IV Stopped (08/12/17 1230)  . dextrose 5 % and 0.9% NaCl 50 mL/hr at 08/13/17 9290  . feeding supplement (JEVITY 1.5 CAL/FIBER)     PRN Meds:.acetaminophen **OR** acetaminophen (TYLENOL) oral liquid 160 mg/5 mL **OR** acetaminophen, albuterol, phenol, senna-docusate   Assessment: Active Problems:   CVA (cerebral vascular accident) ([place])    Plan: Continue feeds via Dobhoff tube Pt. Clinically improving overall GI team will continue to follow As her clinical status improved, her swallowing may improve as well.  Was tolerating pured diet at group home prior to presentation. Repeat swallow eval next week as her clinical status improves and if still abnormal, PEG tube can be considered early next week   LOS: 4 days   [name] Antigua, MD 08/13/2017, 2:23 PM

## 2017-08-14 ENCOUNTER — Inpatient Hospital Stay: Payer: Medicare Other

## 2017-08-14 LAB — GLUCOSE, CAPILLARY
Glucose-Capillary: 123 mg/dL — ABNORMAL HIGH (ref 70–99)
Glucose-Capillary: 203 mg/dL — ABNORMAL HIGH (ref 70–99)
Glucose-Capillary: 185 mg/dL — ABNORMAL HIGH (ref 70–99)
Glucose-Capillary: 160 mg/dL — ABNORMAL HIGH (ref 70–99)
Glucose-Capillary: 221 mg/dL — ABNORMAL HIGH (ref 70–99)
Glucose-Capillary: 167 mg/dL — ABNORMAL HIGH (ref 70–99)

## 2017-08-14 LAB — CULTURE, BLOOD (ROUTINE X 2)
CULTURE: NO GROWTH
CULTURE: NO GROWTH
SPECIAL REQUESTS: ADEQUATE
Special Requests: ADEQUATE

## 2017-08-14 LAB — EXPECTORATED SPUTUM ASSESSMENT W GRAM STAIN, RFLX TO RESP C

## 2017-08-14 LAB — EXPECTORATED SPUTUM ASSESSMENT W REFEX TO RESP CULTURE
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
## 2017-08-14 NOTE — Progress Notes (Signed)
[place] [facility] [place] NAME: [name] [name]    MR#:  [id number]  DATE OF BIRTH:  March 18, 1960  SUBJECTIVE:  CHIEF COMPLAINT:   Chief Complaint  Patient presents with  . Weakness  Patient seen and evaluated today On dubbhoff tube feeding Awake and responds to all verbal commands Has some chest congestion  REVIEW OF SYSTEMS:    ROS Unable to obtain secondary to organic brain disease  DRUG ALLERGIES:   Allergies  Allergen Reactions  . Dilantin [Phenytoin] Other (See Comments)    unknown  . Penicillins Other (See Comments)    .Has patient had a PCN reaction causing immediate rash, facial/tongue/throat swelling, SOB or lightheadedness with hypotension: Unknown Has patient had a PCN reaction causing severe rash involving mucus membranes or skin necrosis: Unknown Has patient had a PCN reaction that required hospitalization: Unknown Has patient had a PCN reaction occurring within the last 10 years: Unknown If all of the above answers are "NO", then may proceed with Cephalosporin use.   Marland Kitchen Phenytoin Sodium Extended Other (See Comments)    Other reaction(s): Other (See Comments)  . Phenytoin Sodium Extended Other (See Comments)    Other reaction(s): Other (See Comments)  . Sulfa Antibiotics Rash and Other (See Comments)    unknown    VITALS:  Blood pressure (!) 143/74, pulse 79, temperature 98.4 F (36.9 C), temperature source Oral, resp. rate 20, height 5\' 6"  (1.676 m), weight 66.6 kg (146 lb 14.4 oz), SpO2 93 %.  PHYSICAL EXAMINATION:   Physical Exam  GENERAL:  57 y.o.-year-old patient lying in the bed with no acute distress.  EYES: Pupils equal, round, reactive to light and accommodation. No scleral icterus. Extraocular muscles intact.  HEENT: Head atraumatic, normocephalic. Oropharynx and nasopharynx clear.  NECK:  Supple, no jugular venous distention. No thyroid enlargement, no tenderness.  LUNGS: Normal breath sounds bilaterally,  scattered rales left lung. No use of accessory muscles of respiration.  CARDIOVASCULAR: S1, S2 normal. No murmurs, rubs, or gallops.  ABDOMEN: Soft, nontender, nondistended. Bowel sounds present. No organomegaly or mass.  EXTREMITIES: No cyanosis, clubbing or edema b/l.    NEUROLOGIC: Cranial nerves II through XII are intact. MAES - globally weak PSYCHIATRIC: slow mentation SKIN: No obvious rash, lesion, or ulcer.   LABORATORY PANEL:   CBC Recent Labs  Lab 08/09/17 1611  WBC 10.9  HGB 14.3  HCT 41.3  PLT 291   ------------------------------------------------------------------------------------------------------------------ Chemistries  Recent Labs  Lab 08/09/17 1658 08/12/17 0356  NA 135 141  K 4.7 3.6  CL 97* 107  CO2 27 26  GLUCOSE 119* 117*  BUN 12 6  CREATININE 0.53 0.60  CALCIUM 9.4 8.7*  AST 24  --   ALT 16  --   ALKPHOS 113  --   BILITOT 0.7  --    ------------------------------------------------------------------------------------------------------------------  Cardiac Enzymes Recent Labs  Lab 08/09/17 1658  TROPONINI <0.03   ------------------------------------------------------------------------------------------------------------------  RADIOLOGY:  Dg Abd 1 View  Result Date: 08/12/2017 CLINICAL DATA:  Nasogastric tube placement. EXAM: ABDOMEN - 1 VIEW COMPARISON:  Abdominal radiograph August 12, 2017 at 2008 hours FINDINGS: Redirected nasogastric tube with distal tip projecting in distribution of mid duodenum. Flocculated density in the colon. Included bowel gas pattern is nondilated and nonobstructive. Surgical clips in the included right abdomen compatible with cholecystectomy. IMPRESSION: Nasogastric tube tip projects in duodenum.  The Electronically Signed   By: Elon Alas M.D.   On: 08/12/2017 22:57   Dg Abd  Portable 1v  Result Date: 08/13/2017 CLINICAL DATA:  Feeding tube placement.  Ex-smoker. EXAM: PORTABLE ABDOMEN - 1 VIEW COMPARISON:   08/12/2017. FINDINGS: Feeding tube tip in the region of the gastric pylorus. Cholecystectomy clips. Normal bowel gas pattern. The included lungs are clear with mild diffuse peribronchial thickening. Mild thoracolumbar scoliosis. Mid and lower lumbar spine degenerative changes. IMPRESSION: 1. Feeding tube tip in the region of the gastric pylorus. 2. Mild chronic bronchitic changes. Electronically Signed   By: Claudie Revering M.D.   On: 08/13/2017 13:49   Dg Abd Portable 1v  Result Date: 08/12/2017 CLINICAL DATA:  NG tube placement. EXAM: PORTABLE ABDOMEN - 1 VIEW COMPARISON:  None. FINDINGS: The tip of a weighted feeding tube projects over the right lung base and is believed to be endobronchial in location. Withdrawal of the tube and repositioning is recommended. Heart and mediastinal contours are normal. Moderate stool burden is seen within the right colon. Cholecystectomy clips are present. IMPRESSION: Feeding tube along the distal right mainstem bronchus. Withdrawal of the tube and repositioning is recommended. These results will be called to the ordering clinician or representative by the Radiologist Assistant, and communication documented in the PACS or zVision Dashboard. Electronically Signed   By: Ashley Royalty M.D.   On: 08/12/2017 20:52     ASSESSMENT AND PLAN:   57 year old female patient with history of traumatic brain injury, organic brain disease, diabetes mellitus type 2 currently under hospitalist service for generalized weakness  -Klebsiella pneumonia urinary tract infection improving IV Rocephin antibiotic based on culture and sensitivity to continue  -Dysphagia Dobbhoff tube again reinserted and on tube feeds status post gastroenterology follow-up Patient used to tolerate pured diet at group home Once patient is clinically much improved will repeat swallow study next week to restart pured diet if possible If patient still failed swallow study even next week again, will consider PEG  tube placement as per gastroenterology  -Global weakness  New onset CVA ruled out  imaging studies were negative for any stroke Supportive care  -Diabetes mellitus type 2 Medical management and sliding scale insulin  -Chest congestion Portable chest x-ray to assess for any aspiration pneumonia  All the records are reviewed and case discussed with Care Management/Social Worker. Management plans discussed with the patient, family and they are in agreement.  CODE STATUS:DNR  DVT Prophylaxis: SCDs  TOTAL TIME TAKING CARE OF THIS PATIENT: 33 minutes.   POSSIBLE D/C IN 2 to 3 DAYS, DEPENDING ON CLINICAL CONDITION.  Saundra Shelling M.D on 08/14/2017 at 10:46 AM  Between 7am to 6pm - Pager - [phone]  After 6pm go to [email or web address] - password [email or web address] [facility]  Office  [phone]  CC: Primary care physician; Steele Sizer, MD  Note: This dictation was prepared with Dragon dictation along with smaller phrase technology. Any transcriptional errors that result from this process are unintentional.

## 2017-08-15 LAB — GLUCOSE, CAPILLARY
GLUCOSE-CAPILLARY: 172 mg/dL — AB (ref 70–99)
GLUCOSE-CAPILLARY: 228 mg/dL — AB (ref 70–99)
Glucose-Capillary: 231 mg/dL — ABNORMAL HIGH (ref 70–99)
Glucose-Capillary: 182 mg/dL — ABNORMAL HIGH (ref 70–99)
Glucose-Capillary: 175 mg/dL — ABNORMAL HIGH (ref 70–99)

## 2017-08-15 NOTE — Progress Notes (Signed)
[name] Antigua, MD [address], [place], [place], [place], [address] [address], [place], [place], [place], [address] Phone: [phone]  Fax: [phone]   Subjective:  Patient tolerating Dobbhoff feeds without difficulty.  No abdominal pain.  No nausea or vomiting.  Objective: Exam: Vital signs in last 24 hours: Vitals:   08/14/17 0541 08/14/17 1323 08/14/17 2000 08/15/17 0451  BP: (!) 143/74 (!) 161/71 (!) 143/83 (!) 144/89  Pulse: 79 94 81 87  Resp: 20 20 19 18   Temp: 98.4 F (36.9 C) 97.8 F (36.6 C) 98.2 F (36.8 C) 98.5 F (36.9 C)  TempSrc: Oral Oral Oral Oral  SpO2: 93% 93% 95% 93%  Weight:    151 lb 14.4 oz (68.9 kg)  Height:       Weight change: 5 lb (2.267 kg)  Intake/Output Summary (Last 24 hours) at 08/15/2017 1039 Last data filed at 08/15/2017 0500 Gross per 24 hour  Intake -  Output 1500 ml  Net -1500 ml    General: No acute distress, AAO x3 Abd: Soft, NT/ND, No HSM Skin: Warm, no rashes Neck: Supple, Trachea midline   Lab Results: Lab Results  Component Value Date   WBC 10.9 08/09/2017   HGB 14.3 08/09/2017   HCT 41.3 08/09/2017   MCV 88.0 08/09/2017   PLT 291 08/09/2017   Micro Results: Recent Results (from the past 240 hour(s))  Urine Culture     Status: Abnormal   Collection Time: 08/09/17  4:58 PM  Result Value Ref Range Status   Specimen Description   Final    URINE, RANDOM Performed at [facility], [address] [place], [place] [address]    Special Requests   Final    NONE Performed at [facility], [address] [place], [place] [address]    Culture >=100,000 COLONIES/mL KLEBSIELLA PNEUMONIAE (A)  Final   Report Status 08/12/2017 FINAL  Final   Organism ID, Bacteria KLEBSIELLA PNEUMONIAE (A)  Final      Susceptibility   Klebsiella pneumoniae - MIC*    AMPICILLIN RESISTANT Resistant     CEFAZOLIN <=4 SENSITIVE Sensitive     CEFTRIAXONE <=1 SENSITIVE Sensitive     CIPROFLOXACIN  <=0.25 SENSITIVE Sensitive     GENTAMICIN <=1 SENSITIVE Sensitive     IMIPENEM <=0.25 SENSITIVE Sensitive     NITROFURANTOIN 64 INTERMEDIATE Intermediate     TRIMETH/SULFA <=20 SENSITIVE Sensitive     AMPICILLIN/SULBACTAM <=2 SENSITIVE Sensitive     PIP/TAZO <=4 SENSITIVE Sensitive     Extended ESBL NEGATIVE Sensitive     * >=100,000 COLONIES/mL KLEBSIELLA PNEUMONIAE  Culture, blood (routine x 2) Call MD if unable to obtain prior to antibiotics being given     Status: None   Collection Time: 08/09/17 11:36 PM  Result Value Ref Range Status   Specimen Description BLOOD BLOOD RIGHT FOREARM  Final   Special Requests   Final    BOTTLES DRAWN AEROBIC AND ANAEROBIC Blood Culture adequate volume   Culture   Final    NO GROWTH 5 DAYS Performed at [facility], [address] [place], [place] [address]    Report Status 08/14/2017 FINAL  Final  Culture, blood (routine x 2) Call MD if unable to obtain prior to antibiotics being given     Status: None   Collection Time: 08/09/17 11:42 PM  Result Value Ref Range Status   Specimen Description BLOOD BLOOD LEFT FOREARM  Final   Special Requests   Final  BOTTLES DRAWN AEROBIC AND ANAEROBIC Blood Culture adequate volume   Culture   Final    NO GROWTH 5 DAYS Performed at [facility], [address] [place], [place] [address]    Report Status 08/14/2017 FINAL  Final  MRSA PCR Screening     Status: None   Collection Time: 08/10/17 12:20 AM  Result Value Ref Range Status   MRSA by PCR NEGATIVE NEGATIVE Final    Comment:        The GeneXpert MRSA Assay (FDA approved for NASAL specimens only), is one component of a comprehensive MRSA colonization surveillance program. It is not intended to diagnose MRSA infection nor to guide or monitor treatment for MRSA infections. Performed at [facility], [address] [place], [place] [address]   Culture, sputum-assessment     Status: None   Collection Time: 08/13/17  11:30 PM  Result Value Ref Range Status   Specimen Description EXPECTORATED SPUTUM  Final   Special Requests NONE  Final   Sputum evaluation   Final    Sputum specimen not acceptable for testing.  Please recollect.   C/JEN MUTESI AT 0008 08/14/17.PMH Performed at [facility], [address] [place], [place] [address]    Report Status 08/14/2017 FINAL  Final   Studies/Results: Dg Chest Port 1 View  Result Date: 08/14/2017 CLINICAL DATA:  PNA per physician order.Pt with history of traumatic brain injury, organic brain disease, diabetes mellitus type 2 currently hopitalized since 08/09/2017 for generalized weakness. Hx - diabetes, HTN, former smoker. EXAM: PORTABLE CHEST - 1 VIEW COMPARISON:  08/09/2017 FINDINGS: Enteric tube extends just beyond the GE junction.  Lungs clear. Heart size and mediastinal contours are within normal limits. No effusion. Degenerative changes in bilateral shoulders.  Cholecystectomy clips. IMPRESSION: Enteric tube tip beyond the GE junction. No acute disease. Electronically Signed   By: Lucrezia Europe M.D.   On: 08/14/2017 11:46   Dg Abd Portable 1v  Result Date: 08/13/2017 CLINICAL DATA:  Feeding tube placement.  Ex-smoker. EXAM: PORTABLE ABDOMEN - 1 VIEW COMPARISON:  08/12/2017. FINDINGS: Feeding tube tip in the region of the gastric pylorus. Cholecystectomy clips. Normal bowel gas pattern. The included lungs are clear with mild diffuse peribronchial thickening. Mild thoracolumbar scoliosis. Mid and lower lumbar spine degenerative changes. IMPRESSION: 1. Feeding tube tip in the region of the gastric pylorus. 2. Mild chronic bronchitic changes. Electronically Signed   By: Claudie Revering M.D.   On: 08/13/2017 13:49   Medications:  Scheduled Meds: . chlorhexidine  15 mL Mouth Rinse BID  . cilostazol  50 mg Per Tube BID  . desmopressin  200 mcg Per Tube QHS  . docusate  100 mg Per Tube BID  . donepezil  10 mg Per Tube Daily  . enoxaparin (LOVENOX) injection  40 mg  Subcutaneous Q24H  . ferrous sulfate  220 mg Per Tube Q breakfast  . fesoterodine  4 mg Oral Daily  . FLUoxetine  10 mg Per Tube Daily  . free water  180 mL Per Tube Q4H  . insulin aspart  0-15 Units Subcutaneous TID WC  . insulin aspart  0-5 Units Subcutaneous QHS  . ketoconazole  1 application Topical Daily  . lurasidone  20 mg Per Tube Daily  . mouth rinse  15 mL Mouth Rinse q12n4p  . memantine  28 mg Oral Daily  . metoprolol tartrate  5 mg Intravenous Q8H  . ramipril  10 mg Per Tube Daily  . simvastatin  40 mg Per Tube QHS  . vitamin C  250 mg Per Tube BID   Continuous Infusions: . dextrose 5 % and 0.9% NaCl 50 mL/hr at 08/14/17 2221  . feeding supplement (JEVITY 1.5 CAL/FIBER) 1,000 mL (08/14/17 2221)   PRN Meds:.acetaminophen **OR** acetaminophen (TYLENOL) oral liquid 160 mg/5 mL **OR** acetaminophen, albuterol, phenol, senna-docusate   Assessment: Active Problems:   CVA (cerebral vascular accident) ([place])  57 year old female with history of traumatic brain injury, cognitive deficits, admitted with UTI, bronchitis, reported difficulty in speech, dragging her right leg, with MRI negative for acute stroke as per neurology and GI consulted for PEG tube placement  Plan: Patient has clinically improved since presentation Modified barium swallow was done at the time of her presentation, when she was having the symptoms of slurred speech, dragging her right leg, and reported right-sided droop more than her baseline.  The symptoms were attributed to her infections per neurology.   Since she is clinically improved, was tolerating pured diet at home prior to presentation, and is now further into her treatment for her infections, would recommend repeating modified barium swallow over the next week, and if still abnormal, PEG tube can be considered.  Continue Dobbhoff tube feeds until then   LOS: 6 days   [name] Antigua, MD 08/15/2017, 10:39 AM

## 2017-08-15 NOTE — Progress Notes (Signed)
[facility] [place] NAME: [name] [name]    MR#:  [id number]  DATE OF BIRTH:  [birth year]  SUBJECTIVE:  CHIEF COMPLAINT:   Chief Complaint  Patient presents with  . Weakness   Patient seen and evaluated today On dubbhoff tube feeding Awake and responds to all verbal commands Has some chest congestion  REVIEW OF SYSTEMS:    ROS Unable to obtain secondary to organic brain disease  DRUG ALLERGIES:   Allergies  Allergen Reactions  . Dilantin [Phenytoin] Other (See Comments)    unknown  . Penicillins Other (See Comments)    .Has patient had a PCN reaction causing immediate rash, facial/tongue/throat swelling, SOB or lightheadedness with hypotension: Unknown Has patient had a PCN reaction causing severe rash involving mucus membranes or skin necrosis: Unknown Has patient had a PCN reaction that required hospitalization: Unknown Has patient had a PCN reaction occurring within the last 10 years: Unknown If all of the above answers are "NO", then may proceed with Cephalosporin use.   Marland Kitchen Phenytoin Sodium Extended Other (See Comments)    Other reaction(s): Other (See Comments)  . Phenytoin Sodium Extended Other (See Comments)    Other reaction(s): Other (See Comments)  . Sulfa Antibiotics Rash and Other (See Comments)    unknown    VITALS:  Blood pressure (!) 145/71, pulse (!) 104, temperature 98.3 F (36.8 C), temperature source Oral, resp. rate 20, height 5\' 6"  (1.676 m), weight 68.9 kg (151 lb 14.4 oz), SpO2 93 %.  PHYSICAL EXAMINATION:   Physical Exam  GENERAL:  57 y.o.-year-old patient lying in the bed with no acute distress.  EYES: Pupils equal, round, reactive to light and accommodation. No scleral icterus. Extraocular muscles intact.  HEENT: Head atraumatic, normocephalic. Oropharynx and nasopharynx clear.  NECK:  Supple, no jugular venous distention. No thyroid enlargement, no tenderness.  LUNGS: Normal breath sounds  bilaterally, scattered rales left lung. No use of accessory muscles of respiration.  CARDIOVASCULAR: S1, S2 normal. No murmurs, rubs, or gallops.  ABDOMEN: Soft, nontender, nondistended. Bowel sounds present. No organomegaly or mass.  EXTREMITIES: No cyanosis, clubbing or edema b/l.    NEUROLOGIC: Cranial nerves II through XII are intact.  globally weak, Does not move limbs. PSYCHIATRIC: slow mentation SKIN: No obvious rash, lesion, or ulcer.   LABORATORY PANEL:   CBC Recent Labs  Lab 08/09/17 1611  WBC 10.9  HGB 14.3  HCT 41.3  PLT 291   ------------------------------------------------------------------------------------------------------------------ Chemistries  Recent Labs  Lab 08/09/17 1658 08/12/17 0356  NA 135 141  K 4.7 3.6  CL 97* 107  CO2 27 26  GLUCOSE 119* 117*  BUN 12 6  CREATININE 0.53 0.60  CALCIUM 9.4 8.7*  AST 24  --   ALT 16  --   ALKPHOS 113  --   BILITOT 0.7  --    ------------------------------------------------------------------------------------------------------------------  Cardiac Enzymes Recent Labs  Lab 08/09/17 1658  TROPONINI <0.03   ------------------------------------------------------------------------------------------------------------------  RADIOLOGY:  Dg Chest Port 1 View  Result Date: 08/14/2017 CLINICAL DATA:  PNA per physician order.Pt with history of traumatic brain injury, organic brain disease, diabetes mellitus type 2 currently hopitalized since 08/09/2017 for generalized weakness. Hx - diabetes, HTN, former smoker. EXAM: PORTABLE CHEST - 1 VIEW COMPARISON:  08/09/2017 FINDINGS: Enteric tube extends just beyond the GE junction.  Lungs clear. Heart size and mediastinal contours are within normal limits. No effusion. Degenerative changes in bilateral shoulders.  Cholecystectomy clips. IMPRESSION: Enteric tube tip beyond  the GE junction. No acute disease. Electronically Signed   By: Lucrezia Europe M.D.   On: 08/14/2017 11:46      ASSESSMENT AND PLAN:   57 year old female patient with history of traumatic brain injury, organic brain disease, diabetes mellitus type 2 currently under hospitalist service for generalized weakness  -Klebsiella pneumonia urinary tract infection improving IV Rocephin antibiotic based on culture and sensitivity to continue  -Dysphagia Dobbhoff tube again reinserted and on tube feeds status post gastroenterology follow-up Patient used to tolerate pured diet at group home Once patient is clinically much improved will repeat swallow study next week to restart pured diet if possible If patient still failed swallow study even next week again, will consider PEG tube placement as per gastroenterology  -Global weakness - due to old TBI- NH resident. New onset CVA ruled out  imaging studies were negative for any stroke Supportive care  -Diabetes mellitus type 2 Medical management and sliding scale insulin  -Chest congestion Portable chest x-ray to assess for any aspiration pneumonia- no acute disease.  All the records are reviewed and case discussed with Care Management/Social Worker. Management plans discussed with the patient, family and they are in agreement.  CODE STATUS:DNR  DVT Prophylaxis: SCDs  TOTAL TIME TAKING CARE OF THIS PATIENT: 33 minutes.   POSSIBLE D/C IN 2 to 3 DAYS, DEPENDING ON CLINICAL CONDITION.  Vaughan Basta M.D on 08/15/2017 at 4:56 PM  Between 7am to 6pm - Pager - [phone]  After 6pm go to [email or web address] - password [email or web address] [facility]  Office  [phone]  CC: Primary care physician; Steele Sizer, MD  Note: This dictation was prepared with Dragon dictation along with smaller phrase technology. Any transcriptional errors that result from this process are unintentional.

## 2017-08-16 LAB — GLUCOSE, CAPILLARY
GLUCOSE-CAPILLARY: 240 mg/dL — AB (ref 70–99)
GLUCOSE-CAPILLARY: 151 mg/dL — AB (ref 70–99)
Glucose-Capillary: 211 mg/dL — ABNORMAL HIGH (ref 70–99)
Glucose-Capillary: 217 mg/dL — ABNORMAL HIGH (ref 70–99)
Glucose-Capillary: 185 mg/dL — ABNORMAL HIGH (ref 70–99)

## 2017-08-16 LAB — BASIC METABOLIC PANEL
ANION GAP: 6 (ref 5–15)
BUN: 10 mg/dL (ref 6–20)
CALCIUM: 8.9 mg/dL (ref 8.9–10.3)
CO2: 30 mmol/L (ref 22–32)
Chloride: 105 mmol/L (ref 98–111)
Creatinine, Ser: 0.53 mg/dL (ref 0.44–1.00)
Glucose, Bld: 253 mg/dL — ABNORMAL HIGH (ref 70–99)
Potassium: 3.6 mmol/L (ref 3.5–5.1)
Sodium: 141 mmol/L (ref 135–145)

## 2017-08-16 LAB — CREATININE, SERUM
Creatinine, Ser: 0.6 mg/dL (ref 0.44–1.00)
GFR calc Af Amer: >60 mL/min (ref >60)

## 2017-08-16 MED ORDER — JEVITY 1.5 CAL/FIBER PO LIQD
1000.0000 mL | ORAL | Status: DC
  Administered 2017-08-16: 15:00:00 1000 mL

## 2017-08-16 MED ORDER — INSULIN ASPART 100 UNIT/ML ~~LOC~~ SOLN
2.0000 [IU] | SUBCUTANEOUS | Status: DC
  Administered 2017-08-16 – 2017-08-17 (×5): 2 [IU] via SUBCUTANEOUS
  Filled 2017-08-16 (×5): qty 1

## 2017-08-16 MED ORDER — INSULIN ASPART 100 UNIT/ML ~~LOC~~ SOLN
0.0000 [IU] | SUBCUTANEOUS | Status: DC
  Administered 2017-08-16 (×2): 3 [IU] via SUBCUTANEOUS
  Administered 2017-08-17: 01:00:00 2 [IU] via SUBCUTANEOUS
  Administered 2017-08-17 (×2): 3 [IU] via SUBCUTANEOUS
  Filled 2017-08-16 (×5): qty 1

## 2017-08-16 NOTE — Plan of Care (Signed)
  Problem: Education: Goal: Knowledge of General Education information will improve Outcome: Progressing   Problem: Health Behavior/Discharge Planning: Goal: Ability to manage health-related needs will improve Outcome: Progressing   Problem: Clinical Measurements: Goal: Ability to maintain clinical measurements within normal limits will improve Outcome: Progressing Goal: Will remain free from infection Outcome: Progressing Goal: Diagnostic test results will improve Outcome: Progressing Goal: Respiratory complications will improve Outcome: Progressing Goal: Cardiovascular complication will be avoided Outcome: Progressing   Problem: Activity: Goal: Risk for activity intolerance will decrease Outcome: Progressing   Problem: Nutrition: Goal: Adequate nutrition will be maintained Outcome: Progressing   Problem: Coping: Goal: Level of anxiety will decrease Outcome: Progressing   Problem: Elimination: Goal: Will not experience complications related to bowel motility Outcome: Progressing Goal: Will not experience complications related to urinary retention Outcome: Progressing   Problem: Pain Managment: Goal: General experience of comfort will improve Outcome: Progressing   Problem: Safety: Goal: Ability to remain free from injury will improve Outcome: Progressing   Problem: Skin Integrity: Goal: Risk for impaired skin integrity will decrease Outcome: Progressing   Problem: Education: Goal: Knowledge of secondary prevention will improve Outcome: Progressing Goal: Knowledge of patient specific risk factors addressed and post discharge goals established will improve Outcome: Progressing

## 2017-08-16 NOTE — Progress Notes (Signed)
[facility] at [place] NAME: [name] [name]    MR#:  [id number]  DATE OF BIRTH:  July 18, 1960  SUBJECTIVE:  CHIEF COMPLAINT:   Chief Complaint  Patient presents with  . Weakness   Patient seen and evaluated today On dubbhoff tube feeding Awake and responds to all verbal commands Has some chest congestion.  REVIEW OF SYSTEMS:    ROS Unable to obtain secondary to organic brain disease  DRUG ALLERGIES:   Allergies  Allergen Reactions  . Dilantin [Phenytoin] Other (See Comments)    unknown  . Penicillins Other (See Comments)    .Has patient had a PCN reaction causing immediate rash, facial/tongue/throat swelling, SOB or lightheadedness with hypotension: Unknown Has patient had a PCN reaction causing severe rash involving mucus membranes or skin necrosis: Unknown Has patient had a PCN reaction that required hospitalization: Unknown Has patient had a PCN reaction occurring within the last 10 years: Unknown If all of the above answers are "NO", then may proceed with Cephalosporin use.   Marland Kitchen Phenytoin Sodium Extended Other (See Comments)    Other reaction(s): Other (See Comments)  . Phenytoin Sodium Extended Other (See Comments)    Other reaction(s): Other (See Comments)  . Sulfa Antibiotics Rash and Other (See Comments)    unknown    VITALS:  Blood pressure (!) 150/75, pulse 100, temperature 99.1 F (37.3 C), temperature source Oral, resp. rate 20, height 5\' 6"  (1.676 m), weight 67.3 kg (148 lb 4.8 oz), SpO2 95 %.  PHYSICAL EXAMINATION:   Physical Exam  GENERAL:  57 y.o.-year-old patient lying in the bed with no acute distress.  EYES: Pupils equal, round, reactive to light and accommodation. No scleral icterus. Extraocular muscles intact.  HEENT: Head atraumatic, normocephalic. Oropharynx and nasopharynx clear.  NECK:  Supple, no jugular venous distention. No thyroid enlargement, no tenderness.  LUNGS: Normal breath sounds bilaterally,  scattered rales left lung. No use of accessory muscles of respiration.  CARDIOVASCULAR: S1, S2 normal. No murmurs, rubs, or gallops.  ABDOMEN: Soft, nontender, nondistended. Bowel sounds present. No organomegaly or mass.  EXTREMITIES: No cyanosis, clubbing or edema b/l.    NEUROLOGIC: Cranial nerves II through XII are intact. globally weak, Does not move limbs. PSYCHIATRIC: slow mentation. SKIN: No obvious rash, lesion, or ulcer.   LABORATORY PANEL:   CBC Recent Labs  Lab 08/09/17 1611  WBC 10.9  HGB 14.3  HCT 41.3  PLT 291   ------------------------------------------------------------------------------------------------------------------ Chemistries  Recent Labs  Lab 08/09/17 1658  08/16/17 0313  NA 135   < > 141  K 4.7   < > 3.6  CL 97*   < > 105  CO2 27   < > 30  GLUCOSE 119*   < > 253*  BUN 12   < > 10  CREATININE 0.53   < > 0.53  0.60  CALCIUM 9.4   < > 8.9  AST 24  --   --   ALT 16  --   --   ALKPHOS 113  --   --   BILITOT 0.7  --   --    < > = values in this interval not displayed.   ------------------------------------------------------------------------------------------------------------------  Cardiac Enzymes Recent Labs  Lab 08/09/17 1658  TROPONINI <0.03   ------------------------------------------------------------------------------------------------------------------  RADIOLOGY:  No results found.   ASSESSMENT AND PLAN:   57 year old female patient with history of traumatic brain injury, organic brain disease, diabetes mellitus type 2 currently under hospitalist service for  generalized weakness  -Klebsiella pneumonia urinary tract infection improving IV Rocephin antibiotic based on culture and sensitivity to continue  -Dysphagia Dobbhoff tube again reinserted and on tube feeds status post gastroenterology follow-up Patient used to tolerate pured diet at group home Will do repeat swallow study tomorrow/ If patient still failed swallow  study again, will consider PEG tube placement as per gastroenterology.  -Global weakness - due to old TBI- NH resident. New onset CVA ruled out  imaging studies were negative for any stroke Supportive care  -Diabetes mellitus type 2 Medical management and sliding scale insulin  -Chest congestion Portable chest x-ray to assess for any aspiration pneumonia- no acute disease.  All the records are reviewed and case discussed with Care Management/Social Worker. Management plans discussed with the patient, family and they are in agreement.  CODE STATUS:DNR  DVT Prophylaxis: SCDs  TOTAL TIME TAKING CARE OF THIS PATIENT: 33 minutes.   POSSIBLE D/C IN 2 to 3 DAYS, DEPENDING ON CLINICAL CONDITION.  Vaughan Basta M.D on 08/16/2017 at 3:12 PM  Between 7am to 6pm - Pager - [phone]  After 6pm go to [email or web address] - password [email or web address] [facility]  Office  [phone]  CC: Primary care physician; Steele Sizer, MD  Note: This dictation was prepared with Dragon dictation along with smaller phrase technology. Any transcriptional errors that result from this process are unintentional.
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
## 2017-08-16 NOTE — Care Management Important Message (Signed)
Important Message  Patient Details  Name: [name] [name] MRN: [id number] Date of Birth: 05/28/1960   Medicare Important Message Given:  Yes    Juliann Pulse A Jessah Danser 08/16/2017, 11:04 AM
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
## 2017-08-16 NOTE — Progress Notes (Signed)
This patient is going for repeat swallowing evaluation tomorrow.  If the patient's swallowing evaluation has not improved the patient may need a feeding tube.  Will await the results of the evaluation.
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
## 2017-08-16 NOTE — Progress Notes (Addendum)
Inpatient Diabetes Program Recommendations  AACE/ADA: New Consensus Statement on Inpatient Glycemic Control (2019)  Target Ranges:  Prepandial:   less than 140 mg/dL      Peak postprandial:   less than 180 mg/dL (1-2 hours)      Critically ill patients:  140 - 180 mg/dL   Results for [name] [name], [name] [name] (MRN [id number]) as of 08/16/2017 09:09  Ref. Range 08/15/2017 07:36 08/15/2017 11:55 08/15/2017 16:30 08/15/2017 20:13 08/15/2017 23:21 08/16/2017 04:06 08/16/2017 07:32  Glucose-Capillary Latest Ref Range: 70 - 99 mg/dL 231 (H) 172 (H) 182 (H) 175 (H) 228 (H) 211 (H) 240 (H)  Results for [name] [name], [name] [name] (MRN [id number]) as of 08/16/2017 09:09  Ref. Range 08/10/2017 04:02  Hemoglobin A1C Latest Ref Range: 4.8 - 5.6 % 6.2 (H)   Review of Glycemic Control  Diabetes history: DM2 Outpatient Diabetes medications: Metformin 850 mg BID Current orders for Inpatient glycemic control: Novolog 0-15 units TID with meals, Novolog 0-5 units QHS; Jevity @20 -50 ml/hr  Inpatient Diabetes Program Recommendations:  Insulin - Tube Feeding Coverage: Please consider ordering Novolog 2 units Q4H for tube feeding coverage. If tube feeding is stopped or held then Novolog tube feeding coverage would also be stopped or held. Insulin-Correction: Please consider changing frequency of CBGs and Novolog 0-15 units to Q4H.  Thanks, Barnie Alderman, RN, MSN, CDE Diabetes Coordinator Inpatient Diabetes Program [phone] (Team Pager from 8am to 5pm)

## 2017-08-17 ENCOUNTER — Inpatient Hospital Stay: Payer: Medicare Other

## 2017-08-17 LAB — GLUCOSE, CAPILLARY
GLUCOSE-CAPILLARY: 114 mg/dL — AB (ref 70–99)
GLUCOSE-CAPILLARY: 130 mg/dL — AB (ref 70–99)
GLUCOSE-CAPILLARY: 162 mg/dL — AB (ref 70–99)
Glucose-Capillary: 156 mg/dL — ABNORMAL HIGH (ref 70–99)

## 2017-08-17 LAB — CBC
HCT: 37.1 % (ref 35.0–47.0)
HEMOGLOBIN: 12.6 g/dL (ref 12.0–16.0)
MCH: 30.1 pg (ref 26.0–34.0)
MCHC: 33.9 g/dL (ref 32.0–36.0)
MCV: 88.9 fL (ref 80.0–100.0)
PLATELETS: 276 10*3/uL (ref 150–440)
RBC: 4.17 MIL/uL (ref 3.80–5.20)
RDW: 14.1 % (ref 11.5–14.5)
WBC: 8 10*3/uL (ref 3.6–11.0)

## 2017-08-17 MED ORDER — INSULIN ASPART 100 UNIT/ML ~~LOC~~ SOLN: 2.0000 [IU] | Freq: Four times a day (QID) | SUBCUTANEOUS | Status: DC

## 2017-08-17 MED ORDER — INSULIN ASPART 100 UNIT/ML ~~LOC~~ SOLN
0.0000 [IU] | Freq: Four times a day (QID) | SUBCUTANEOUS | Status: DC
  Administered 2017-08-18: 5 [IU] via SUBCUTANEOUS
  Filled 2017-08-17: qty 1
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
## 2017-08-17 NOTE — Clinical Social Work Note (Signed)
Patient is medically ready for discharge back to [facility] today. CSW contacted Butch Penny at [facility] group home [phone]. Butch Penny states that patient has been discharged from their facility and can not return. CSW explained that patient is ready for discharge and guardian was not aware that patient had been discharged. Butch Penny states that guardian was informed by letter that was sent last Thursday. CSW called patient guardian Maximino Sarin but she was not available. CSW left a voicemail asking for a return call. CSW will continue to try and contact guardian to determine discharge plan. CSW will continue to follow.   [name] [name], [place]

## 2017-08-17 NOTE — Progress Notes (Signed)
[facility] [place] NAME: [name] [name]    MR#:  [id number]  DATE OF BIRTH:  December 12, 1960  SUBJECTIVE:  CHIEF COMPLAINT:   Chief Complaint  Patient presents with  . Weakness   Patient seen and evaluated today On dubbhoff tube feeding Awake and responds to all verbal commands Has some chest congestion.  REVIEW OF SYSTEMS:    ROS Unable to obtain secondary to organic brain disease  DRUG ALLERGIES:   Allergies  Allergen Reactions  . Dilantin [Phenytoin] Other (See Comments)    unknown  . Penicillins Other (See Comments)    .Has patient had a PCN reaction causing immediate rash, facial/tongue/throat swelling, SOB or lightheadedness with hypotension: Unknown Has patient had a PCN reaction causing severe rash involving mucus membranes or skin necrosis: Unknown Has patient had a PCN reaction that required hospitalization: Unknown Has patient had a PCN reaction occurring within the last 10 years: Unknown If all of the above answers are "NO", then may proceed with Cephalosporin use.   Marland Kitchen Phenytoin Sodium Extended Other (See Comments)    Other reaction(s): Other (See Comments)  . Phenytoin Sodium Extended Other (See Comments)    Other reaction(s): Other (See Comments)  . Sulfa Antibiotics Rash and Other (See Comments)    unknown    VITALS:  Blood pressure (!) 144/95, pulse 92, temperature 97.7 F (36.5 C), temperature source Oral, resp. rate 18, height 5\' 6"  (1.676 m), weight 68.4 kg (150 lb 14.4 oz), SpO2 99 %.  PHYSICAL EXAMINATION:   Physical Exam  GENERAL:  57 y.o.-year-old patient lying in the bed with no acute distress.  EYES: Pupils equal, round, reactive to light and accommodation. No scleral icterus. Extraocular muscles intact.  HEENT: Head atraumatic, normocephalic. Oropharynx and nasopharynx clear.  NECK:  Supple, no jugular venous distention. No thyroid enlargement, no tenderness.  LUNGS: Normal breath sounds bilaterally,  scattered rales left lung. No use of accessory muscles of respiration.  CARDIOVASCULAR: S1, S2 normal. No murmurs, rubs, or gallops.  ABDOMEN: Soft, nontender, nondistended. Bowel sounds present. No organomegaly or mass.  EXTREMITIES: No cyanosis, clubbing or edema b/l.    NEUROLOGIC: Cranial nerves II through XII are intact. globally weak, Does not move limbs. PSYCHIATRIC: slow mentation. SKIN: No obvious rash, lesion, or ulcer.   LABORATORY PANEL:   CBC Recent Labs  Lab 08/17/17 0556  WBC 8.0  HGB 12.6  HCT 37.1  PLT 276   ------------------------------------------------------------------------------------------------------------------ Chemistries  Recent Labs  Lab 08/16/17 0313  NA 141  K 3.6  CL 105  CO2 30  GLUCOSE 253*  BUN 10  CREATININE 0.53  0.60  CALCIUM 8.9   ------------------------------------------------------------------------------------------------------------------  Cardiac Enzymes No results for input(s): TROPONINI in the last 168 hours. ------------------------------------------------------------------------------------------------------------------  RADIOLOGY:  No results found.   ASSESSMENT AND PLAN:   57 year old female patient with history of traumatic brain injury, organic brain disease, diabetes mellitus type 2 currently under hospitalist service for generalized weakness  -Klebsiella pneumonia urinary tract infection improving IV Rocephin antibiotic based on culture and sensitivity to continue  -Dysphagia Dobbhoff tube again reinserted and on tube feeds status post gastroenterology follow-up Patient used to tolerate pured diet at group home As per repeat swallow evaluation, patient had some aspiration in initial phase but later she was able to swallow fine without much of the CDU and aspirations.  She is at slightly higher risk of aspirations but with advised of slow and small amount feeding, she may be able  to tolerate the dysphagia  diet. I have discussed this with the patient's legal guardian and her sister, she understands and agreed to the start dysphagia diet and let her go to the group home. Group home cannot accept the patient today so we will wait until tomorrow for discharge.  -Global weakness - due to old TBI- NH resident. New onset CVA ruled out  imaging studies were negative for any stroke Supportive care  -Diabetes mellitus type 2 Medical management and sliding scale insulin  -Chest congestion Portable chest x-ray to assess for any aspiration pneumonia- no acute disease.  All the records are reviewed and case discussed with Care Management/Social Worker. Management plans discussed with the patient, family and they are in agreement.  CODE STATUS:DNR  DVT Prophylaxis: SCDs  TOTAL TIME TAKING CARE OF THIS PATIENT: 33 minutes.   POSSIBLE D/C IN 1-2 DAYS, DEPENDING ON CLINICAL CONDITION.  Vaughan Basta M.D on 08/17/2017 at 5:45 PM  Between 7am to 6pm - Pager - [phone]  After 6pm go to [email or web address] - password [email or web address] [facility]  Office  [phone]  CC: Primary care physician; Steele Sizer, MD  Note: This dictation was prepared with Dragon dictation along with smaller phrase technology. Any transcriptional errors that result from this process are unintentional.

## 2017-08-17 NOTE — Clinical Social Work Note (Signed)
CSW spoke with patient's guardian/sister [name] [name] [phone]. CSW explained that patient was medically ready for discharge today but that [facility] can not accept patient back. Guardian states that she would like patient to return to [facility]. CSW explained that she would need to speak with the [facility] staff because that is their decision. CSW explained that currently patient will not discharge because [facility] can not take patient back. CSW explained that a bed search for SNF has been done and that [facility] can accept patient once PASRR is received. Guardian states that she would like patient to go to [facility] for rehab and return to [facility]. CSW explained that per Butch Penny, patient no longer has a bed at [facility] and may not be able to return at all. CSW explained that we are waiting for PASRR to be obtained. CSW also explained that currently [facility] is the only SNF that has accepted patient. Guardian reports that she understands and has asked CSW to follow up with [facility] tomorrow to see if they would be able to accept patient. CSW will follow up with [facility] tomorrow and follow up with guardian. CSW will continue to follow for discharge planning.   [place], [place]

## 2017-08-17 NOTE — NC FL2 (Signed)
[place] LEVEL OF CARE SCREENING TOOL     IDENTIFICATION  Patient Name: [name] [name] Birthdate: 02-01-1960 Sex: female Admission Date (Current Location): 08/09/2017  [facility] and [place] Number:  Engineering geologist and Address:  [facility], [address], [place], [place] [address]      Provider Number: [id number]  Attending Physician Name and Address:  Vaughan Basta, *  Relative Name and Phone Number:       Current Level of Care: Hospital Recommended Level of Care: Other (Comment)(Group home ) Prior Approval Number:    Date Approved/Denied:   PASRR Number:    Discharge Plan: Other (Comment)(Group Home )    Current Diagnoses: Patient Active Problem List   Diagnosis Date Noted  . CVA (cerebral vascular accident) ([place]) 08/09/2017  . Chronic gastritis 06/09/2017  . HTN (hypertension) 04/08/2017  . 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 PVD (peripheral vascular disease) ([place]) 04/08/2017  . Barrett's esophagus without dysplasia 04/08/2017  . Senile purpura ([place]) 04/08/2017  . Traumatic brain injury ([place]) 12/03/2014  . Chronic constipation 08/23/2014  . Major depression, recurrent, chronic ([place]) 08/23/2014  . Diabetes mellitus, type 2 ([place]) 08/23/2014  . OP (osteoporosis) 08/23/2014  . HLD (hyperlipidemia) 08/23/2014  . Cognitive impairment 08/23/2014    Orientation RESPIRATION BLADDER Height & Weight     Self, Place  Normal(1 liter ) Incontinent Weight: 150 lb 14.4 oz (68.4 kg) Height:  5\' 6"  (167.6 cm)  BEHAVIORAL SYMPTOMS/MOOD NEUROLOGICAL BOWEL NUTRITION STATUS  (none ) (None ) Continent Diet(NPO to be advanced )  AMBULATORY STATUS COMMUNICATION OF NEEDS Skin   Limited Assist Verbally Normal                       Personal Care Assistance Level of Assistance  Bathing, Feeding, Dressing Bathing Assistance: Limited assistance Feeding assistance: Limited assistance Dressing Assistance: Independent     Functional Limitations Info  Sight, Hearing, Speech Sight Info: Adequate Hearing Info: Adequate Speech Info: Adequate    SPECIAL CARE FACTORS FREQUENCY                       Contractures Contractures Info: Not present    Additional Factors Info  Code Status, Allergies Code Status Info: DNR Allergies Info: Dilantin Phenytoin, Penicillins, Phenytoin Sodium Extended, Phenytoin Sodium Extended, Sulfa Antibiotics           Current Medications (08/17/2017):  This is the current hospital active medication list Current Facility-Administered Medications  Medication Dose Route Frequency Provider Last Rate Last Dose  . acetaminophen (TYLENOL) tablet 650 mg  650 mg Oral Q4H PRN Salary, Montell D, MD       Or  . acetaminophen (TYLENOL) solution 650 mg  650 mg Per Tube Q4H PRN Salary, Montell D, MD       Or  . acetaminophen (TYLENOL) suppository 650 mg  650 mg Rectal Q4H PRN Salary, Montell D, MD      . albuterol (PROVENTIL) (2.5 MG/3ML) 0.083% nebulizer solution 2.5 mg  2.5 mg Inhalation Q4H PRN Salary, Montell D, MD   2.5 mg at 08/10/17 0906  . chlorhexidine (PERIDEX) 0.12 % solution 15 mL  15 mL Mouth Rinse BID Salary, Montell D, MD   15 mL at 08/17/17 0957  . cilostazol (PLETAL) tablet 50 mg  50 mg Per Tube BID Saundra Shelling, MD   50 mg at 08/17/17 0956  . desmopressin (DDAVP) tablet 200 mcg  200 mcg Per  Tube QHS Saundra Shelling, MD   200 mcg at 08/16/17 2207  . docusate (COLACE) 50 MG/5ML liquid 100 mg  100 mg Per Tube BID Saundra Shelling, MD   100 mg at 08/17/17 0955  . donepezil (ARICEPT) tablet 10 mg  10 mg Per Tube Daily Pyreddy, Reatha Harps, MD   10 mg at 08/17/17 0957  . enoxaparin (LOVENOX) injection 40 mg  40 mg Subcutaneous Q24H Salary, Montell D, MD   40 mg at 08/16/17 2206  . ferrous sulfate 220 (44 Fe) MG/5ML solution 220 mg  220 mg Per Tube Q breakfast Saundra Shelling, MD   220 mg at 08/17/17 0955  . fesoterodine (TOVIAZ) tablet 4 mg  4 mg Oral Daily Salary, Montell D, MD    4 mg at 08/17/17 0956  . FLUoxetine (PROZAC) 20 MG/5ML solution 10 mg  10 mg Per Tube Daily Pyreddy, Reatha Harps, MD   10 mg at 08/17/17 0957  . insulin aspart (novoLOG) injection 0-15 Units  0-15 Units Subcutaneous Q6H Vaughan Basta, MD      . insulin aspart (novoLOG) injection 2 Units  2 Units Subcutaneous Q6H Vaughan Basta, MD      . lurasidone (LATUDA) tablet 20 mg  20 mg Per Tube Daily Saundra Shelling, MD   20 mg at 08/17/17 0956  . MEDLINE mouth rinse  15 mL Mouth Rinse q12n4p Salary, Montell D, MD   15 mL at 08/16/17 1742  . memantine (NAMENDA XR) 24 hr capsule 28 mg  28 mg Oral Daily Salary, Montell D, MD   28 mg at 08/17/17 0956  . metoprolol tartrate (LOPRESSOR) injection 5 mg  5 mg Intravenous Q8H Pyreddy, Reatha Harps, MD   5 mg at 08/17/17 0432  . phenol (CHLORASEPTIC) mouth spray 1 spray  1 spray Mouth/Throat PRN Pyreddy, [place]avan, MD      . ramipril (ALTACE) capsule 10 mg  10 mg Per Tube Daily Pyreddy, Reatha Harps, MD   10 mg at 08/17/17 0957  . senna-docusate (Senokot-S) tablet 1 tablet  1 tablet Per Tube QHS PRN Pyreddy, Reatha Harps, MD      . simvastatin (ZOCOR) tablet 40 mg  40 mg Per Tube QHS Saundra Shelling, MD   40 mg at 08/16/17 2207  . vitamin C (ASCORBIC ACID) tablet 250 mg  250 mg Per Tube BID Saundra Shelling, MD   250 mg at 08/17/17 0957     Discharge Medications: Please see discharge summary for a list of discharge medications.  Relevant Imaging Results:  Relevant Lab Results:   Additional Information    Nefertiti Mohamad  Louretta Shorten, LCSWA

## 2017-08-18 DIAGNOSIS — M6281 Muscle weakness (generalized): Secondary | ICD-10-CM | POA: Diagnosis not present

## 2017-08-18 DIAGNOSIS — R1312 Dysphagia, oropharyngeal phase: Secondary | ICD-10-CM | POA: Diagnosis not present

## 2017-08-18 DIAGNOSIS — Z7401 Bed confinement status: Secondary | ICD-10-CM | POA: Diagnosis not present

## 2017-08-18 DIAGNOSIS — E119 Type 2 diabetes mellitus without complications: Secondary | ICD-10-CM | POA: Diagnosis not present

## 2017-08-18 DIAGNOSIS — F3289 Other specified depressive episodes: Secondary | ICD-10-CM | POA: Diagnosis not present

## 2017-08-18 DIAGNOSIS — R4182 Altered mental status, unspecified: Secondary | ICD-10-CM | POA: Diagnosis not present

## 2017-08-18 DIAGNOSIS — J690 Pneumonitis due to inhalation of food and vomit: Secondary | ICD-10-CM | POA: Diagnosis not present

## 2017-08-18 DIAGNOSIS — I639 Cerebral infarction, unspecified: Secondary | ICD-10-CM | POA: Diagnosis not present

## 2017-08-18 DIAGNOSIS — N390 Urinary tract infection, site not specified: Secondary | ICD-10-CM | POA: Diagnosis not present

## 2017-08-18 DIAGNOSIS — I10 Essential (primary) hypertension: Secondary | ICD-10-CM | POA: Diagnosis not present

## 2017-08-18 DIAGNOSIS — G933 Postviral fatigue syndrome: Secondary | ICD-10-CM | POA: Diagnosis not present

## 2017-08-18 DIAGNOSIS — R471 Dysarthria and anarthria: Secondary | ICD-10-CM | POA: Diagnosis not present

## 2017-08-18 DIAGNOSIS — F329 Major depressive disorder, single episode, unspecified: Secondary | ICD-10-CM | POA: Diagnosis not present

## 2017-08-18 DIAGNOSIS — S069X9A Unspecified intracranial injury with loss of consciousness of unspecified duration, initial encounter: Secondary | ICD-10-CM | POA: Diagnosis not present

## 2017-08-18 DIAGNOSIS — M810 Age-related osteoporosis without current pathological fracture: Secondary | ICD-10-CM | POA: Diagnosis not present

## 2017-08-18 DIAGNOSIS — G8250 Quadriplegia, unspecified: Secondary | ICD-10-CM | POA: Diagnosis not present

## 2017-08-18 DIAGNOSIS — G800 Spastic quadriplegic cerebral palsy: Secondary | ICD-10-CM | POA: Diagnosis not present

## 2017-08-18 DIAGNOSIS — R1310 Dysphagia, unspecified: Secondary | ICD-10-CM | POA: Diagnosis not present

## 2017-08-18 DIAGNOSIS — I6990 Unspecified sequelae of unspecified cerebrovascular disease: Secondary | ICD-10-CM | POA: Diagnosis not present

## 2017-08-18 DIAGNOSIS — N3281 Overactive bladder: Secondary | ICD-10-CM | POA: Diagnosis not present

## 2017-08-18 DIAGNOSIS — R41841 Cognitive communication deficit: Secondary | ICD-10-CM | POA: Diagnosis not present

## 2017-08-18 DIAGNOSIS — K219 Gastro-esophageal reflux disease without esophagitis: Secondary | ICD-10-CM | POA: Diagnosis not present

## 2017-08-18 DIAGNOSIS — F0390 Unspecified dementia without behavioral disturbance: Secondary | ICD-10-CM | POA: Diagnosis not present

## 2017-08-18 DIAGNOSIS — E785 Hyperlipidemia, unspecified: Secondary | ICD-10-CM | POA: Diagnosis not present

## 2017-08-18 LAB — GLUCOSE, CAPILLARY
GLUCOSE-CAPILLARY: 100 mg/dL — AB (ref 70–99)
Glucose-Capillary: 212 mg/dL — ABNORMAL HIGH (ref 70–99)

## 2017-08-18 MED ORDER — RAMIPRIL 10 MG PO CAPS
10.0000 mg | ORAL_CAPSULE | Freq: Every day | ORAL | Status: DC
  Administered 2017-08-18: 10:00:00 10 mg via ORAL
  Filled 2017-08-18: qty 1

## 2017-08-18 MED ORDER — FERROUS SULFATE 325 (65 FE) MG PO TABS
325.0000 mg | ORAL_TABLET | Freq: Every day | ORAL | Status: DC
  Administered 2017-08-18: 10:00:00 325 mg via ORAL
  Filled 2017-08-18: qty 1

## 2017-08-18 MED ORDER — SIMVASTATIN 20 MG PO TABS: 40.0000 mg | ORAL_TABLET | Freq: Every day | ORAL | Status: DC

## 2017-08-18 MED ORDER — LURASIDONE HCL 40 MG PO TABS
20.0000 mg | ORAL_TABLET | Freq: Every day | ORAL | Status: DC
  Administered 2017-08-18: 20 mg via ORAL
  Filled 2017-08-18: qty 1

## 2017-08-18 MED ORDER — CILOSTAZOL 100 MG PO TABS
50.0000 mg | ORAL_TABLET | Freq: Two times a day (BID) | ORAL | Status: DC
  Administered 2017-08-18: 50 mg via ORAL
  Filled 2017-08-18 (×2): qty 0.5

## 2017-08-18 MED ORDER — FERROUS SULFATE 220 (44 FE) MG/5ML PO ELIX: 220.0000 mg | ORAL_SOLUTION | Freq: Every day | ORAL | Status: DC

## 2017-08-18 MED ORDER — DOCUSATE SODIUM 50 MG/5ML PO LIQD
100.0000 mg | Freq: Two times a day (BID) | ORAL | Status: DC
  Administered 2017-08-18: 10:00:00 100 mg via ORAL
  Filled 2017-08-18 (×2): qty 10

## 2017-08-18 MED ORDER — DESMOPRESSIN ACETATE 0.2 MG PO TABS
200.0000 ug | ORAL_TABLET | Freq: Every day | ORAL | Status: DC
  Filled 2017-08-18: qty 1

## 2017-08-18 MED ORDER — VITAMIN C 500 MG PO TABS
250.0000 mg | ORAL_TABLET | Freq: Two times a day (BID) | ORAL | Status: DC
  Administered 2017-08-18: 250 mg via ORAL
  Filled 2017-08-18 (×2): qty 0.5

## 2017-08-18 MED ORDER — BISACODYL 10 MG RE SUPP: 10.0000 mg | RECTAL | 0 refills | Status: DC | PRN

## 2017-08-18 MED ORDER — FLUOXETINE HCL 10 MG PO CAPS
10.0000 mg | ORAL_CAPSULE | Freq: Every day | ORAL | Status: DC
  Administered 2017-08-18: 10:00:00 10 mg via ORAL
  Filled 2017-08-18: qty 1

## 2017-08-18 NOTE — Progress Notes (Signed)
Speech Language Pathology Treatment: Dysphagia  Patient Details Name: [name] [name] MRN: [id number] DOB: 02/03/1960 Today's Date: 08/18/2017 Time: 1210-1255 SLP Time Calculation (min) (ACUTE ONLY): 45 min  Assessment / Plan / Recommendation Clinical Impression  Pt seen for f/u w/ trials of the dysphagia diet of Puree and Honey consistency liquids; these were consistencies that were somewhat tolerated during the repeat MBSS yesterday, however, any oral intake continues to present risk for aspiration d/t pt's degree of oropharyngeal phase dysphagia. This diet consistency has been the same diet consistency pt has been on for some time now at the [facility]. MD addressed goals of care w/ Sister/POA post MBSSs, and the oral diet was ordered. Pt wil be discharging to a SNF vs returning to the [facility] per CM. Pt was fed, and consumed, po trials of dysphagia level 1(puree) diet w/ Honey consistency liquids VIA TSP ONLY. Pt exhibited inconsistent, overt s/s of aspiration w/ the oral intake moreso if a larger TSP bolus was presented. SLP moved to presenting 1/2 TSP size boluses which pt appeared to tolerate better w/ more timely swallow and less f/u, multiple swallows. Intermittent, delayed coughing was noted during this meal/session. NSG reported noting same this morning when feeding pt the breakfast meal.  This presentation w/ po trials today at meals continues to speak to pt's increased risk for aspiration w/ any oral intake d/t her degree of oropharyngeal phase dysphagia - pt's swallowing function is easily impacted by her environment, feeding presentation, and her stamina to maintain effortful, effective swallowing as well as maintain airway protection. At any time, the fatigue from the demand on her system of the swallowing task could be too great and increase risk for aspiration d/t her oropharyngeal phase dysphagia. Recommend continued Supervision w/ any oral intake, strict aspiration precautions, and  feeding support/monitoring at meals. Strict use of SMALL bolus amounts of food/liquids; rest breaks, and encouragement to use multiple swallows and cough hard to clear any potential aspiration. Recommend continued education w/ family/pt on her swallowing status and safety w/ oral intake for goals of care.     HPI HPI: Pt is a 57 y.o. female with a h/o traumatic brain injury at age 52 with cognitive deficits, depression, vision deficits, speech difficulty, chronic constipation, DM, HTN, Spastic quadriparesis, arthritis who presented from group home setting with 3-week history of dragging her right leg, caregivers noted difficulty with speech today, ER work-up noted for cough and presentation. 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  CT chest noted Faint soft tissue densities in the trachea, mainstem bronchi and right lower lobe bronchi may reflect soft tissue debris and/or mucous; bronchitic changes bilaterally. Patient evaluated in the emergency room, sister at the bedside who is the guardian, patient is poor historian due to history of traumatic brain injury/organic brain disease, noted right facial droop, global weakness throughout, complaints of dragging her right leg for the last several weeks. Patient is now admitted for acute/subacute cerebrovascular accident, acute probable UTI, and probable aspiration pneumonia.  Brother and caregiver present in room endorsed h/o difficulty swallowing w/ coughing "at times" but that it seemed "worse" in the past 2-3 weeks. Brother stated that if pt began laughing, she could start coughing "since they were children".  Pt is on a dysphagia diet at the [facility].  Since MBSS post BSE at admission, pt has been NPO d/t noted  aspiration on MBSS; significant oropharyngeal phase dysphagia noted. Pt received a  dobhoff feeding tube for nutrition/hydration for past ~4 days. Repeat MBSS performed w/ somewhat improved swallow function, however, remains at increased risk for aspiration secondary to severity of oropharyngeal phase dysphagia. MD is discussing w/ family/POA goals of care to include attempting to return to (baseline) dysphagia diet of Puree, Honey consistency liquids vs PEG placement.       SLP Plan  Continue with current plan of care       Recommendations  Diet recommendations: Dysphagia 1 (puree);Honey-thick liquid Liquids provided via: HALF Teaspoon size amounts;No straw Medication Administration: Crushed with puree Supervision: Staff to assist with self feeding;Full supervision/cueing for compensatory strategies Compensations: Minimize environmental distractions;Slow rate;Small sips/bites;Multiple dry swallows after each bite/sip;Follow solids with liquid Postural Changes and/or Swallow Maneuvers: Seated upright 90 degrees;Upright 30-60 min after meal(Rest breaks b/t trials)                General recommendations: (Dietician f/u) Oral Care Recommendations: Oral care BID;Staff/trained caregiver to provide oral care Follow up Recommendations: Skilled Nursing facility SLP Visit Diagnosis: Dysphagia, oropharyngeal phase (R13.12) Plan: Continue with current plan of care       [place], [place], CCC-SLP Watson,Katherine 08/18/2017, 4:24 PM

## 2017-08-18 NOTE — Progress Notes (Signed)
Pt discharged via non-emergent [facility] to [facility]. Report called to accepting RN. IV removed, pt on room air and in no distress. Ammie Dalton, RN

## 2017-08-18 NOTE — Clinical Social Work Note (Signed)
Patient is medically ready for discharge today. CSW spoke with patient's guardian Maximino Sarin [phone]. Linwood Dibbles has chosen [facility] for her sister. CSW notified Tammy at [facility] of discharge today. Patient will be transported by EMS. RN to call report and call for transport.   [place], [place]

## 2017-08-18 NOTE — Care Management Important Message (Signed)
Important Message  Patient Details  Name: [name] [name] MRN: [id number] Date of Birth: 1960-08-18   Medicare Important Message Given:  Yes    Juliann Pulse A Adlene Adduci 08/18/2017, 11:25 AM

## 2017-08-18 NOTE — Plan of Care (Signed)
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
## 2017-08-19 DIAGNOSIS — N3281 Overactive bladder: Secondary | ICD-10-CM | POA: Diagnosis not present

## 2017-08-19 DIAGNOSIS — M6281 Muscle weakness (generalized): Secondary | ICD-10-CM | POA: Diagnosis not present

## 2017-08-19 DIAGNOSIS — E785 Hyperlipidemia, unspecified: Secondary | ICD-10-CM | POA: Diagnosis not present

## 2017-08-19 DIAGNOSIS — N390 Urinary tract infection, site not specified: Secondary | ICD-10-CM | POA: Diagnosis not present

## 2017-08-19 DIAGNOSIS — G800 Spastic quadriplegic cerebral palsy: Secondary | ICD-10-CM | POA: Diagnosis not present

## 2017-08-19 DIAGNOSIS — E119 Type 2 diabetes mellitus without complications: Secondary | ICD-10-CM | POA: Diagnosis not present

## 2017-08-19 DIAGNOSIS — F0390 Unspecified dementia without behavioral disturbance: Secondary | ICD-10-CM | POA: Diagnosis not present

## 2017-08-24 NOTE — Telephone Encounter (Signed)
Forms was faxed on 07/13/17 but I will refax them today.

## 2017-08-24 NOTE — Telephone Encounter (Signed)
[facility] called to check into the status of the paperwork needed; contact [phone]

## 2017-08-25 ENCOUNTER — Inpatient Hospital Stay: Payer: Medicare Other | Admitting: Family Medicine

## 2017-08-26 DIAGNOSIS — I10 Essential (primary) hypertension: Secondary | ICD-10-CM | POA: Diagnosis not present

## 2017-08-26 DIAGNOSIS — E785 Hyperlipidemia, unspecified: Secondary | ICD-10-CM | POA: Diagnosis not present

## 2017-08-26 DIAGNOSIS — E119 Type 2 diabetes mellitus without complications: Secondary | ICD-10-CM | POA: Diagnosis not present

## 2017-08-26 DIAGNOSIS — F0390 Unspecified dementia without behavioral disturbance: Secondary | ICD-10-CM | POA: Diagnosis not present

## 2017-08-26 DIAGNOSIS — N390 Urinary tract infection, site not specified: Secondary | ICD-10-CM | POA: Diagnosis not present

## 2017-08-26 DIAGNOSIS — G933 Postviral fatigue syndrome: Secondary | ICD-10-CM | POA: Diagnosis not present

## 2017-08-26 DIAGNOSIS — N3281 Overactive bladder: Secondary | ICD-10-CM | POA: Diagnosis not present

## 2017-09-02 DIAGNOSIS — E785 Hyperlipidemia, unspecified: Secondary | ICD-10-CM | POA: Diagnosis not present

## 2017-09-02 DIAGNOSIS — G800 Spastic quadriplegic cerebral palsy: Secondary | ICD-10-CM | POA: Diagnosis not present

## 2017-09-02 DIAGNOSIS — E119 Type 2 diabetes mellitus without complications: Secondary | ICD-10-CM | POA: Diagnosis not present

## 2017-09-02 DIAGNOSIS — N3281 Overactive bladder: Secondary | ICD-10-CM | POA: Diagnosis not present

## 2017-09-02 DIAGNOSIS — F329 Major depressive disorder, single episode, unspecified: Secondary | ICD-10-CM | POA: Diagnosis not present

## 2017-09-02 DIAGNOSIS — I10 Essential (primary) hypertension: Secondary | ICD-10-CM | POA: Diagnosis not present

## 2017-09-02 DIAGNOSIS — F0390 Unspecified dementia without behavioral disturbance: Secondary | ICD-10-CM | POA: Diagnosis not present

## 2017-09-02 DIAGNOSIS — M6281 Muscle weakness (generalized): Secondary | ICD-10-CM | POA: Diagnosis not present

## 2017-09-06 ENCOUNTER — Telehealth: Payer: Self-pay | Admitting: Family Medicine

## 2017-09-06 NOTE — Telephone Encounter (Signed)
Copied from [place] [id number]. Topic: Quick Communication - See Telephone Encounter >> Sep 06, 2017  5:02 PM Ivar Drape wrote: CRM for notification. See Telephone encounter for: 09/06/17. Maximino Sarin would like for the provider to consult with Dr. Lovie Macadamia, the medical director of [facility] [phone] regarding any information that can be given him regarding the patient's swallowing abilities.

## 2017-09-07 NOTE — Telephone Encounter (Signed)
I have seen this patient only once in March, I cannot give him any information. I am sorry

## 2017-09-08 NOTE — Telephone Encounter (Signed)
Called and informed [facility] we have not seen patient since March 2019.

## 2017-09-10 DIAGNOSIS — E785 Hyperlipidemia, unspecified: Secondary | ICD-10-CM | POA: Diagnosis not present

## 2017-09-10 DIAGNOSIS — Z7401 Bed confinement status: Secondary | ICD-10-CM | POA: Diagnosis not present

## 2017-09-10 DIAGNOSIS — Z9229 Personal history of other drug therapy: Secondary | ICD-10-CM | POA: Diagnosis not present

## 2017-09-10 DIAGNOSIS — E871 Hypo-osmolality and hyponatremia: Secondary | ICD-10-CM | POA: Diagnosis not present

## 2017-09-10 DIAGNOSIS — Z881 Allergy status to other antibiotic agents status: Secondary | ICD-10-CM | POA: Diagnosis not present

## 2017-09-10 DIAGNOSIS — Z79899 Other long term (current) drug therapy: Secondary | ICD-10-CM | POA: Diagnosis not present

## 2017-09-10 DIAGNOSIS — R1310 Dysphagia, unspecified: Secondary | ICD-10-CM | POA: Diagnosis present

## 2017-09-10 DIAGNOSIS — Z87820 Personal history of traumatic brain injury: Secondary | ICD-10-CM | POA: Diagnosis not present

## 2017-09-10 DIAGNOSIS — R112 Nausea with vomiting, unspecified: Secondary | ICD-10-CM | POA: Diagnosis not present

## 2017-09-10 DIAGNOSIS — R471 Dysarthria and anarthria: Secondary | ICD-10-CM | POA: Diagnosis not present

## 2017-09-10 DIAGNOSIS — R532 Functional quadriplegia: Secondary | ICD-10-CM | POA: Diagnosis not present

## 2017-09-10 DIAGNOSIS — Z880 Allergy status to penicillin: Secondary | ICD-10-CM | POA: Diagnosis not present

## 2017-09-10 DIAGNOSIS — Z87891 Personal history of nicotine dependence: Secondary | ICD-10-CM | POA: Diagnosis not present

## 2017-09-10 DIAGNOSIS — I6389 Other cerebral infarction: Secondary | ICD-10-CM | POA: Diagnosis not present

## 2017-09-10 DIAGNOSIS — N3281 Overactive bladder: Secondary | ICD-10-CM | POA: Diagnosis not present

## 2017-09-10 DIAGNOSIS — K5909 Other constipation: Secondary | ICD-10-CM | POA: Diagnosis not present

## 2017-09-10 DIAGNOSIS — G8250 Quadriplegia, unspecified: Secondary | ICD-10-CM | POA: Diagnosis not present

## 2017-09-10 DIAGNOSIS — K2270 Barrett's esophagus without dysplasia: Secondary | ICD-10-CM | POA: Diagnosis not present

## 2017-09-10 DIAGNOSIS — I69351 Hemiplegia and hemiparesis following cerebral infarction affecting right dominant side: Secondary | ICD-10-CM | POA: Diagnosis not present

## 2017-09-10 DIAGNOSIS — S069X9A Unspecified intracranial injury with loss of consciousness of unspecified duration, initial encounter: Secondary | ICD-10-CM | POA: Diagnosis not present

## 2017-09-10 DIAGNOSIS — Z4682 Encounter for fitting and adjustment of non-vascular catheter: Secondary | ICD-10-CM | POA: Diagnosis not present

## 2017-09-10 DIAGNOSIS — R633 Feeding difficulties: Secondary | ICD-10-CM | POA: Diagnosis not present

## 2017-09-10 DIAGNOSIS — R05 Cough: Secondary | ICD-10-CM | POA: Diagnosis not present

## 2017-09-10 DIAGNOSIS — Z538 Procedure and treatment not carried out for other reasons: Secondary | ICD-10-CM | POA: Diagnosis not present

## 2017-09-10 DIAGNOSIS — E119 Type 2 diabetes mellitus without complications: Secondary | ICD-10-CM | POA: Diagnosis not present

## 2017-09-10 DIAGNOSIS — K922 Gastrointestinal hemorrhage, unspecified: Secondary | ICD-10-CM | POA: Diagnosis not present

## 2017-09-10 DIAGNOSIS — M6281 Muscle weakness (generalized): Secondary | ICD-10-CM | POA: Diagnosis not present

## 2017-09-10 DIAGNOSIS — R41841 Cognitive communication deficit: Secondary | ICD-10-CM | POA: Diagnosis not present

## 2017-09-10 DIAGNOSIS — K920 Hematemesis: Secondary | ICD-10-CM | POA: Diagnosis present

## 2017-09-10 DIAGNOSIS — Z7982 Long term (current) use of aspirin: Secondary | ICD-10-CM | POA: Diagnosis not present

## 2017-09-10 DIAGNOSIS — Z882 Allergy status to sulfonamides status: Secondary | ICD-10-CM | POA: Diagnosis not present

## 2017-09-10 DIAGNOSIS — Z431 Encounter for attention to gastrostomy: Secondary | ICD-10-CM | POA: Diagnosis present

## 2017-09-10 DIAGNOSIS — K2901 Acute gastritis with bleeding: Secondary | ICD-10-CM | POA: Diagnosis not present

## 2017-09-10 DIAGNOSIS — K2980 Duodenitis without bleeding: Secondary | ICD-10-CM | POA: Diagnosis not present

## 2017-09-10 DIAGNOSIS — M1990 Unspecified osteoarthritis, unspecified site: Secondary | ICD-10-CM | POA: Diagnosis not present

## 2017-09-10 DIAGNOSIS — K2970 Gastritis, unspecified, without bleeding: Secondary | ICD-10-CM | POA: Diagnosis not present

## 2017-09-10 DIAGNOSIS — Z978 Presence of other specified devices: Secondary | ICD-10-CM | POA: Diagnosis not present

## 2017-09-10 DIAGNOSIS — F0390 Unspecified dementia without behavioral disturbance: Secondary | ICD-10-CM | POA: Diagnosis not present

## 2017-09-10 DIAGNOSIS — K449 Diaphragmatic hernia without obstruction or gangrene: Secondary | ICD-10-CM | POA: Diagnosis not present

## 2017-09-10 DIAGNOSIS — G800 Spastic quadriplegic cerebral palsy: Secondary | ICD-10-CM | POA: Diagnosis not present

## 2017-09-10 DIAGNOSIS — Z7984 Long term (current) use of oral hypoglycemic drugs: Secondary | ICD-10-CM | POA: Diagnosis not present

## 2017-09-10 DIAGNOSIS — R634 Abnormal weight loss: Secondary | ICD-10-CM | POA: Diagnosis not present

## 2017-09-10 DIAGNOSIS — I6990 Unspecified sequelae of unspecified cerebrovascular disease: Secondary | ICD-10-CM | POA: Diagnosis not present

## 2017-09-10 DIAGNOSIS — R1312 Dysphagia, oropharyngeal phase: Secondary | ICD-10-CM | POA: Diagnosis not present

## 2017-09-10 DIAGNOSIS — F329 Major depressive disorder, single episode, unspecified: Secondary | ICD-10-CM | POA: Diagnosis not present

## 2017-09-10 DIAGNOSIS — N390 Urinary tract infection, site not specified: Secondary | ICD-10-CM | POA: Diagnosis not present

## 2017-09-10 DIAGNOSIS — I10 Essential (primary) hypertension: Secondary | ICD-10-CM | POA: Diagnosis not present

## 2017-09-10 DIAGNOSIS — Z23 Encounter for immunization: Secondary | ICD-10-CM | POA: Diagnosis not present

## 2017-09-14 DIAGNOSIS — M6281 Muscle weakness (generalized): Secondary | ICD-10-CM | POA: Diagnosis not present

## 2017-09-14 DIAGNOSIS — S069X9A Unspecified intracranial injury with loss of consciousness of unspecified duration, initial encounter: Secondary | ICD-10-CM | POA: Diagnosis not present

## 2017-09-14 DIAGNOSIS — E119 Type 2 diabetes mellitus without complications: Secondary | ICD-10-CM | POA: Diagnosis not present

## 2017-09-14 DIAGNOSIS — I6389 Other cerebral infarction: Secondary | ICD-10-CM | POA: Diagnosis not present

## 2017-09-20 DIAGNOSIS — F0390 Unspecified dementia without behavioral disturbance: Secondary | ICD-10-CM | POA: Diagnosis not present

## 2017-09-20 DIAGNOSIS — F329 Major depressive disorder, single episode, unspecified: Secondary | ICD-10-CM | POA: Diagnosis not present

## 2017-10-04 DIAGNOSIS — F329 Major depressive disorder, single episode, unspecified: Secondary | ICD-10-CM | POA: Diagnosis not present

## 2017-10-04 DIAGNOSIS — F0390 Unspecified dementia without behavioral disturbance: Secondary | ICD-10-CM | POA: Diagnosis not present

## 2017-10-12 DIAGNOSIS — R1312 Dysphagia, oropharyngeal phase: Secondary | ICD-10-CM | POA: Diagnosis not present

## 2017-10-12 DIAGNOSIS — E119 Type 2 diabetes mellitus without complications: Secondary | ICD-10-CM | POA: Diagnosis not present

## 2017-10-12 DIAGNOSIS — I69351 Hemiplegia and hemiparesis following cerebral infarction affecting right dominant side: Secondary | ICD-10-CM | POA: Diagnosis not present

## 2017-10-12 DIAGNOSIS — I6389 Other cerebral infarction: Secondary | ICD-10-CM | POA: Diagnosis not present

## 2017-10-29 ENCOUNTER — Telehealth: Payer: Self-pay

## 2017-10-29 NOTE — Telephone Encounter (Signed)
Copied from [place] [id number]. Topic: Quick Communication - See Telephone Encounter >> Oct 29, 2017 12:13 PM Antonieta Iba C wrote: CRM for notification. See Telephone encounter for: 10/29/17.  [facility] is calling in to request a J- Code for insurance billing (Medicare)   For nebulizer machine.    Fax: [phone] Phone: [phone] Kennyth Lose

## 2017-11-01 DIAGNOSIS — R1312 Dysphagia, oropharyngeal phase: Secondary | ICD-10-CM | POA: Diagnosis not present

## 2017-11-05 DIAGNOSIS — F329 Major depressive disorder, single episode, unspecified: Secondary | ICD-10-CM | POA: Diagnosis not present

## 2017-11-05 DIAGNOSIS — F0390 Unspecified dementia without behavioral disturbance: Secondary | ICD-10-CM | POA: Diagnosis not present

## 2017-11-08 ENCOUNTER — Encounter: Payer: Self-pay | Admitting: *Deleted

## 2017-11-09 ENCOUNTER — Encounter: Payer: Self-pay | Admitting: *Deleted

## 2017-11-09 ENCOUNTER — Encounter
Admission: RE | Disposition: A | Discharge status: Home / Self Care | Payer: Self-pay | Source: Ambulatory Visit | Attending: Internal Medicine

## 2017-11-09 ENCOUNTER — Ambulatory Visit: Payer: Medicare Other | Admitting: Anesthesiology

## 2017-11-09 ENCOUNTER — Ambulatory Visit
Admission: RE | Admit: 2017-11-09 | Discharge: 2017-11-09 | Disposition: A | Discharge status: Home / Self Care | Payer: Medicare Other | Source: Ambulatory Visit | Attending: Internal Medicine | Admitting: Internal Medicine

## 2017-11-09 DIAGNOSIS — K5909 Other constipation: Secondary | ICD-10-CM | POA: Insufficient documentation

## 2017-11-09 DIAGNOSIS — E785 Hyperlipidemia, unspecified: Secondary | ICD-10-CM | POA: Diagnosis not present

## 2017-11-09 DIAGNOSIS — Z538 Procedure and treatment not carried out for other reasons: Secondary | ICD-10-CM | POA: Insufficient documentation

## 2017-11-09 DIAGNOSIS — G8250 Quadriplegia, unspecified: Secondary | ICD-10-CM | POA: Insufficient documentation

## 2017-11-09 DIAGNOSIS — K2980 Duodenitis without bleeding: Secondary | ICD-10-CM | POA: Diagnosis not present

## 2017-11-09 DIAGNOSIS — Z79899 Other long term (current) drug therapy: Secondary | ICD-10-CM | POA: Insufficient documentation

## 2017-11-09 DIAGNOSIS — K2270 Barrett's esophagus without dysplasia: Secondary | ICD-10-CM | POA: Insufficient documentation

## 2017-11-09 DIAGNOSIS — R633 Feeding difficulties: Secondary | ICD-10-CM | POA: Diagnosis not present

## 2017-11-09 DIAGNOSIS — R1310 Dysphagia, unspecified: Secondary | ICD-10-CM | POA: Insufficient documentation

## 2017-11-09 DIAGNOSIS — I10 Essential (primary) hypertension: Secondary | ICD-10-CM | POA: Diagnosis not present

## 2017-11-09 DIAGNOSIS — Z7982 Long term (current) use of aspirin: Secondary | ICD-10-CM | POA: Insufficient documentation

## 2017-11-09 DIAGNOSIS — F329 Major depressive disorder, single episode, unspecified: Secondary | ICD-10-CM | POA: Diagnosis not present

## 2017-11-09 DIAGNOSIS — E119 Type 2 diabetes mellitus without complications: Secondary | ICD-10-CM | POA: Diagnosis not present

## 2017-11-09 DIAGNOSIS — Z880 Allergy status to penicillin: Secondary | ICD-10-CM | POA: Insufficient documentation

## 2017-11-09 DIAGNOSIS — K449 Diaphragmatic hernia without obstruction or gangrene: Secondary | ICD-10-CM | POA: Diagnosis not present

## 2017-11-09 DIAGNOSIS — Z7984 Long term (current) use of oral hypoglycemic drugs: Secondary | ICD-10-CM | POA: Insufficient documentation

## 2017-11-09 DIAGNOSIS — R1312 Dysphagia, oropharyngeal phase: Secondary | ICD-10-CM | POA: Diagnosis not present

## 2017-11-09 DIAGNOSIS — Z881 Allergy status to other antibiotic agents status: Secondary | ICD-10-CM | POA: Insufficient documentation

## 2017-11-09 DIAGNOSIS — Z882 Allergy status to sulfonamides status: Secondary | ICD-10-CM | POA: Insufficient documentation

## 2017-11-09 DIAGNOSIS — M1990 Unspecified osteoarthritis, unspecified site: Secondary | ICD-10-CM | POA: Insufficient documentation

## 2017-11-09 DIAGNOSIS — Z87820 Personal history of traumatic brain injury: Secondary | ICD-10-CM | POA: Insufficient documentation

## 2017-11-09 HISTORY — PX: ESOPHAGOGASTRODUODENOSCOPY (EGD) WITH PROPOFOL: SHX5813

## 2017-11-09 HISTORY — PX: PEG PLACEMENT: SHX5437

## 2017-11-09 LAB — GLUCOSE, CAPILLARY: Glucose-Capillary: 113 mg/dL — ABNORMAL HIGH (ref 70–99)

## 2017-11-09 SURGERY — INSERTION, PEG TUBE
Anesthesia: General

## 2017-11-09 MED ORDER — SODIUM CHLORIDE 0.9 % IV SOLN
INTRAVENOUS | Status: DC
  Administered 2017-11-09: 1000 mL via INTRAVENOUS

## 2017-11-09 MED ORDER — CLINDAMYCIN PHOSPHATE 600 MG/50ML IV SOLN
600.0000 mg | INTRAVENOUS | Status: AC
  Administered 2017-11-09: 600 mg via INTRAVENOUS

## 2017-11-09 MED ORDER — FENTANYL CITRATE (PF) 100 MCG/2ML IJ SOLN
INTRAMUSCULAR | Status: AC
  Filled 2017-11-09: qty 2

## 2017-11-09 MED ORDER — LIDOCAINE 2% (20 MG/ML) 5 ML SYRINGE
INTRAMUSCULAR | Status: DC | PRN
  Administered 2017-11-09: 30 mg via INTRAVENOUS

## 2017-11-09 MED ORDER — PROPOFOL 10 MG/ML IV BOLUS
INTRAVENOUS | Status: DC | PRN
  Administered 2017-11-09: 80 mg via INTRAVENOUS

## 2017-11-09 MED ORDER — PROPOFOL 500 MG/50ML IV EMUL
INTRAVENOUS | Status: DC | PRN
  Administered 2017-11-09: 140 ug/kg/min via INTRAVENOUS

## 2017-11-09 MED ORDER — PROPOFOL 500 MG/50ML IV EMUL
INTRAVENOUS | Status: AC
  Filled 2017-11-09: qty 50

## 2017-11-09 MED ORDER — CLINDAMYCIN PHOSPHATE 600 MG/50ML IV SOLN
INTRAVENOUS | Status: AC
  Filled 2017-11-09: qty 50

## 2017-11-09 MED ORDER — PROPOFOL 10 MG/ML IV BOLUS
INTRAVENOUS | Status: AC
  Filled 2017-11-09: qty 20

## 2017-11-09 MED ORDER — FENTANYL CITRATE (PF) 100 MCG/2ML IJ SOLN
INTRAMUSCULAR | Status: DC | PRN
  Administered 2017-11-09: 50 ug via INTRAVENOUS

## 2017-11-09 NOTE — Anesthesia Post-op Follow-up Note (Signed)
Anesthesia QCDR form completed.        

## 2017-11-09 NOTE — Transfer of Care (Signed)
Immediate Anesthesia Transfer of Care Note  Patient: [name] [name]  Procedure(s) Performed: PERCUTANEOUS ENDOSCOPIC GASTROSTOMY (PEG) PLACEMENT (N/A ) ESOPHAGOGASTRODUODENOSCOPY (EGD) WITH PROPOFOL (N/A )  Patient Location: PACU and Endoscopy Unit  Anesthesia Type:General  Level of Consciousness: drowsy  Airway & Oxygen Therapy: Patient Spontanous Breathing and Patient connected to T-piece oxygen  Post-op Assessment: Report given to RN and Post -op Vital signs reviewed and stable  Post vital signs: Reviewed and stable  Last Vitals:  Vitals Value Taken Time  BP 107/71 11/09/2017  2:51 PM  Temp 36 C 11/09/2017  2:51 PM  Pulse 61 11/09/2017  2:54 PM  Resp 3 11/09/2017  2:54 PM  SpO2 99 % 11/09/2017  2:54 PM  Vitals shown include unvalidated device data.  Last Pain:  Vitals:   11/09/17 1451  TempSrc: Tympanic  PainSc: Asleep      Patients Stated Pain Goal: 0 (98/33/82 5053)  Complications: No apparent anesthesia complications
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
## 2017-11-09 NOTE — Op Note (Signed)
[facility] Gastroenterology Patient Name: [name] [name] Procedure Date: 11/09/2017 1:55 PM MRN: [id number] Account #: [id number] Date of Birth: 1960/07/07 Admit Type: Outpatient Age: 57 Room: [facility] ENDO ROOM 3 Gender: Female Note Status: Finalized Procedure:            Upper GI endoscopy Providers:            Benay Pike. Alice Reichert MD, MD, Lollie Sails, MD Referring MD:         Bethena Roys. Sowles, MD (Referring MD) Complications:        No immediate complications. Procedure:            Pre-Anesthesia Assessment:                       - ASA Grade Assessment: III - A patient with severe                        systemic disease.                       After obtaining informed consent, the endoscope was                        passed under direct vision. Throughout the procedure,                        the patient's blood pressure, pulse, and oxygen                        saturations were monitored continuously. The Endoscope                        was introduced through the mouth, with the intention of                        advancing to the duodenum. The scope was advanced to                        the duodenal bulb/second portion of the duodenum before                        the procedure was aborted. Medications were given. The                        upper GI endoscopy was accomplished without difficulty.                        The patient tolerated the procedure well. The procedure                        was aborted due to inability to find an appropriate                        location for PEG placement. Findings:      There were esophageal mucosal changes secondary to established       Gras-segment Barrett's disease present in the lower third of the       esophagus. The maximum longitudinal extent of these mucosal changes was       3 cm in length.      A small hiatal  hernia was present.      The scope was advanced to the second portion of the duodenum, then    returned to the gastric vault. Despite several tries , an appropriate       location for the PEG placemebnt could not be located by       transillumination or finger palpation. Impression:           - The procedure was aborted due to inability to find an                        appropriate location for PEG placement.                       - Esophageal mucosal changes secondary to established                        [place]-segment Barrett's disease.                       - Small hiatal hernia.                       - No specimens collected. Recommendation:       - Discharge patient to home.                       - Will contact Interventional radiology for PEG                        placement. Procedure Code(s):    --- Professional ---                       [phone], 52, Esophagogastroduodenoscopy, flexible,                        transoral; diagnostic, including collection of                        specimen(s) by brushing or washing, when performed                        (separate procedure) CPT copyright 2018 American Medical Association. All rights reserved. The codes documented in this report are preliminary and upon coder review may  be revised to meet current compliance requirements. Lollie Sails, MD 11/09/2017 2:54:39 PM This report has been signed electronically. Efrain Sella MD, MD Number of Addenda: 0 Note Initiated On: 11/09/2017 1:55 PM      [facility]

## 2017-11-10 NOTE — Anesthesia Postprocedure Evaluation (Signed)
Anesthesia Post Note  Patient: [name] [name]  Procedure(s) Performed: PERCUTANEOUS ENDOSCOPIC GASTROSTOMY (PEG) PLACEMENT (N/A ) ESOPHAGOGASTRODUODENOSCOPY (EGD) WITH PROPOFOL (N/A )  Patient location during evaluation: Endoscopy Anesthesia Type: General Level of consciousness: awake and alert Pain management: pain level controlled Vital Signs Assessment: post-procedure vital signs reviewed and stable Respiratory status: spontaneous breathing, nonlabored ventilation, respiratory function stable and patient connected to nasal cannula oxygen Cardiovascular status: blood pressure returned to baseline and stable Postop Assessment: no apparent nausea or vomiting Anesthetic complications: no     Last Vitals:  Vitals:   11/09/17 1501 11/09/17 1511  BP: 121/74 129/79  Pulse: 66 65  Resp: 14 16  Temp:    SpO2: 99% 97%    Last Pain:  Vitals:   11/09/17 1511  TempSrc:   PainSc: 0-No pain                 Precious Haws [place]it[place]

## 2017-11-11 ENCOUNTER — Other Ambulatory Visit: Payer: Self-pay | Admitting: Gastroenterology

## 2017-11-11 ENCOUNTER — Ambulatory Visit: Payer: Medicare Other | Admitting: Podiatry

## 2017-11-11 DIAGNOSIS — I6389 Other cerebral infarction: Secondary | ICD-10-CM | POA: Diagnosis not present

## 2017-11-11 DIAGNOSIS — I10 Essential (primary) hypertension: Secondary | ICD-10-CM | POA: Diagnosis not present

## 2017-11-11 DIAGNOSIS — R1312 Dysphagia, oropharyngeal phase: Secondary | ICD-10-CM

## 2017-11-11 DIAGNOSIS — E119 Type 2 diabetes mellitus without complications: Secondary | ICD-10-CM | POA: Diagnosis not present

## 2017-11-11 DIAGNOSIS — R634 Abnormal weight loss: Secondary | ICD-10-CM

## 2017-11-11 NOTE — Progress Notes (Signed)
Patient unable to have peg placed endoscopically- Discussed with Dr Alice Reichert, [place], and [place]. To have IR place tube under fluoroscopy.

## 2017-11-12 ENCOUNTER — Encounter: Payer: Self-pay | Admitting: Interventional Radiology

## 2017-11-12 ENCOUNTER — Ambulatory Visit
Admission: RE | Admit: 2017-11-12 | Discharge: 2017-11-12 | Disposition: A | Discharge status: Home / Self Care | Payer: Medicare Other | Source: Ambulatory Visit | Attending: Gastroenterology | Admitting: Gastroenterology

## 2017-11-12 DIAGNOSIS — R1312 Dysphagia, oropharyngeal phase: Secondary | ICD-10-CM | POA: Diagnosis not present

## 2017-11-12 DIAGNOSIS — G800 Spastic quadriplegic cerebral palsy: Secondary | ICD-10-CM | POA: Diagnosis not present

## 2017-11-12 DIAGNOSIS — R634 Abnormal weight loss: Secondary | ICD-10-CM | POA: Diagnosis not present

## 2017-11-12 DIAGNOSIS — Z431 Encounter for attention to gastrostomy: Secondary | ICD-10-CM | POA: Insufficient documentation

## 2017-11-12 HISTORY — PX: IR GASTROSTOMY TUBE MOD SED: IMG625

## 2017-11-12 LAB — CBC
HCT: 41.9 % (ref 36.0–46.0)
HEMOGLOBIN: 13.5 g/dL (ref 12.0–15.0)
MCH: 29.2 pg (ref 26.0–34.0)
MCHC: 32.2 g/dL (ref 30.0–36.0)
MCV: 90.5 fL (ref 80.0–100.0)
NRBC: 0 % (ref 0.0–0.2)
PLATELETS: 265 10*3/uL (ref 150–400)
RBC: 4.63 MIL/uL (ref 3.87–5.11)
RDW: 13.3 % (ref 11.5–15.5)
WBC: 6.1 10*3/uL (ref 4.0–10.5)

## 2017-11-12 LAB — PROTIME-INR
INR: 0.94
Prothrombin Time: 12.5 seconds (ref 11.4–15.2)

## 2017-11-12 LAB — GLUCOSE, CAPILLARY: GLUCOSE-CAPILLARY: 87 mg/dL (ref 70–99)

## 2017-11-12 MED ORDER — IOPAMIDOL (ISOVUE-300) INJECTION 61%
30.0000 mL | Freq: Once | INTRAVENOUS | Status: AC | PRN
  Administered 2017-11-12: 5 mL

## 2017-11-12 MED ORDER — FENTANYL CITRATE (PF) 100 MCG/2ML IJ SOLN
INTRAMUSCULAR | Status: AC | PRN
  Administered 2017-11-12: 50 ug via INTRAVENOUS

## 2017-11-12 MED ORDER — SODIUM CHLORIDE 0.9 % IV SOLN
INTRAVENOUS | Status: DC
  Administered 2017-11-12: 10:00:00 via INTRAVENOUS

## 2017-11-12 MED ORDER — VANCOMYCIN HCL IN DEXTROSE 1-5 GM/200ML-% IV SOLN
1000.0000 mg | Freq: Once | INTRAVENOUS | Status: AC
  Administered 2017-11-12: 1000 mg via INTRAVENOUS
  Filled 2017-11-12 (×2): qty 200

## 2017-11-12 MED ORDER — FENTANYL CITRATE (PF) 100 MCG/2ML IJ SOLN
INTRAMUSCULAR | Status: AC
  Filled 2017-11-12: qty 4

## 2017-11-12 MED ORDER — LIDOCAINE HCL (PF) 1 % IJ SOLN
INTRAMUSCULAR | Status: AC
  Filled 2017-11-12: qty 30

## 2017-11-12 MED ORDER — ONDANSETRON HCL 4 MG/2ML IJ SOLN: 4.0000 mg | INTRAMUSCULAR | Status: DC | PRN

## 2017-11-12 MED ORDER — MIDAZOLAM HCL 5 MG/5ML IJ SOLN
INTRAMUSCULAR | Status: AC | PRN
  Administered 2017-11-12: 1 mg via INTRAVENOUS

## 2017-11-12 MED ORDER — LIDOCAINE HCL (PF) 1 % IJ SOLN
INTRAMUSCULAR | Status: AC | PRN
  Administered 2017-11-12: 6 mL

## 2017-11-12 MED ORDER — MIDAZOLAM HCL 5 MG/5ML IJ SOLN
INTRAMUSCULAR | Status: AC
  Filled 2017-11-12: qty 5

## 2017-11-12 MED ORDER — GLUCAGON HCL RDNA (DIAGNOSTIC) 1 MG IJ SOLR
INTRAMUSCULAR | Status: AC | PRN
  Administered 2017-11-12: 1 mg via INTRAVENOUS

## 2017-11-12 MED ORDER — GLUCAGON HCL RDNA (DIAGNOSTIC) 1 MG IJ SOLR
INTRAMUSCULAR | Status: AC
  Filled 2017-11-12: qty 1

## 2017-11-12 NOTE — Progress Notes (Signed)
Spoke with MD, pt cleared for discharge

## 2017-11-12 NOTE — Discharge Instructions (Signed)
Gastrostomy Tube Home Guide, Adult  A gastrostomy tube is a tube that is surgically placed into the stomach. It is also called a “G-tube.” G-tubes are used when a person is unable to eat and drink enough on their own to stay healthy. The tube is inserted into the stomach through a small cut (incision) in the skin. This tube is used for:  · Feeding.  · Giving medication.    Gastrostomy tube care  · Wash your hands with soap and water.  · Remove the old dressing (if any). Some styles of G-tubes may need a dressing inserted between the skin and the G-tube. Other types of G-tubes do not require a dressing. Ask your health care provider if a dressing is needed.  · Check the area where the tube enters the skin (insertion site) for redness, swelling, or pus-like (purulent) drainage. A small amount of clear or tan liquid drainage is normal. Check to make sure scar tissue (skin) is not growing around the insertion site. This could have a raised, bumpy appearance.  · A cotton swab can be used to clean the skin around the tube:  ? When the G-tube is first put in, a normal saline solution or water can be used to clean the skin.  ? Mild soap and warm water can be used when the skin around the G-tube site has healed.  ? Roll the cotton swab around the G-tube insertion site to remove any drainage or crusting at the insertion site.  Stomach residuals  Feeding tube residuals are the amount of liquids that are in the stomach at any given time. Residuals may be checked before giving feedings, medications, or as instructed by your health care provider.  · Ask your health care provider if there are instances when you would not start tube feedings depending on the amount or type of contents withdrawn from the stomach.  · Check residuals by attaching a syringe to the G-tube and pulling back on the syringe plunger. Note the amount, and return the residual back into the stomach.    Flushing the G-tube  · The G-tube should be periodically  flushed with clean warm water to keep it from clogging.  ? Flush the G-tube after feedings or medications. Draw up 30 mL of warm water in a syringe. Connect the syringe to the G-tube and slowly push the water into the tube.  ? Do not push feedings, medications, or flushes rapidly. Flush the G-tube gently and slowly.  ? Only use syringes made for G-tubes to flush medications or feedings.  ? Your health care provider may want the G-tube flushed more often or with more water. If this is the case, follow your health care provider's instructions.  Feedings  Your health care provider will determine whether feedings are given as a bolus (a certain amount given at one time and at scheduled times) or whether feedings will be given continuously on a feeding pump.  · Formulas should be given at room temperature.  · If feedings are continuous, no more than 4 hours worth of feedings should be placed in the feeding bag. This helps prevent spoilage or accidental excess infusion.  · Cover and place unused formula in the refrigerator.  · If feedings are continuous, stop the feedings when medications or flushes are given. Be sure to restart the feedings.  · Feeding bags and syringes should be replaced as instructed by your health care provider.    Giving medication  · In general, it   is best if all medications are in a liquid form for G-tube administration. Liquid medications are less likely to clog the G-tube.  ? Mix the liquid medication with 30 mL (or amount recommended by your health care provider) of warm water.  ? Draw up the medication into the syringe.  ? Attach the syringe to the G-tube and slowly push the mixture into the G-tube.  ? After giving the medication, draw up 30 mL of warm water in the syringe and slowly flush the G-tube.  · For pills or capsules, check with your health care provider first before crushing medications. Some pills are not effective if they are crushed. Some capsules are sustained-release  medications.  ? If appropriate, crush the pill or capsule and mix with 30 mL of warm water. Using the syringe, slowly push the medication through the tube, then flush the tube with another 30 mL of tap water.  G-tube problems  G-tube was pulled out.  · Cause: May have been pulled out accidentally.  · Solutions: Cover the opening with clean dressing and tape. Call your health care provider right away. The G-tube should be put in as soon as possible (within 4 hours) so the G-tube opening (tract) does not close. The G-tube needs to be put in at a health care setting. An X-ray needs to be done to confirm placement before the G-tube can be used again.    Redness, irritation, soreness, or foul odor around the gastrostomy site.  · Cause: May be caused by leakage or infection.  · Solutions: Call your health care provider right away.    Large amount of leakage of fluid or mucus-like liquid present (a large amount means it soaks clothing).  · Cause: Many reasons could cause the G-tube to leak.  · Solutions: Call your health care provider to discuss the amount of leakage.    Skin or scar tissue appears to be growing where tube enters skin.  · Cause: Tissue growth may develop around the insertion site if the G-tube is moved or pulled on excessively.  · Solutions: Secure tube with tape so that excess movement does not occur. Call your health care provider.    G-tube is clogged.  · Cause: Thick formula or medication.  · Solutions: Try to slowly push warm water into the tube with a large syringe. Never try to push any object into the tube to unclog it. Do not force fluid into the G-tube. If you are unable to unclog the tube, call your health care provider right away.    Tips  · Head of bed (HOB) position refers to the upright position of a person's upper body.  ? When giving medications or a feeding bolus, keep the HOB up as told by your health care provider. Do this during the feeding and for 1 hour after the feeding or  medication administration.  ? If continuous feedings are being given, it is best to keep the HOB up as told by your health care provider. When ADLs (activities of daily living) are performed and the HOB needs to be flat, be sure to turn the feeding pump off. Restart the feeding pump when the HOB is returned to the recommended height.  · Do not pull or put tension on the tube.  · To prevent fluid backflow, kink the G-tube before removing the cap or disconnecting a syringe.  · Check the G-tube length every day. Measure from the insertion site to the end of the G-tube. If   the length is longer than previous measurements, the tube may be coming out. Call your health care provider if you notice increasing G-tube length.  · Oral care, such as brushing teeth, must be continued.  · You may need to remove excess air (vent) from the G-tube. Your health care provider will tell you if this is needed.  · Always call your health care provider if you have questions or problems with the G-tube.  Get help right away if:  · You have severe abdominal pain, tenderness, or abdominal bloating (distension).  · You have nausea or vomiting.  · You are constipated or have problems moving your bowels.  · The G-tube insertion site is red, swollen, has a foul smell, or has yellow or brown drainage.  · You have difficulty breathing or shortness of breath.  · You have a fever.  · You have a large amount of feeding tube residuals.  · The G-tube is clogged and cannot be flushed.  This information is not intended to replace advice given to you by your health care provider. Make sure you discuss any questions you have with your health care provider.  Document Released: 03/23/2001 Document Revised: 06/20/2015 Document Reviewed: 09/19/2012  Elsevier Interactive Patient Education © 2017 [employer]

## 2017-11-12 NOTE — Procedures (Signed)
Interventional Radiology Procedure Note  Procedure: Placement of percutaneous 20F pull-through gastrostomy tube. Complications: None Recommendations: - NPO except for sips and chips remainder of today and overnight - Maintain G-tube to LWS until tomorrow morning  - May advance diet as tolerated and begin using tube tomorrow morning  Signed,  Marie Borowski K. Charmian Forbis, MD   

## 2017-11-17 ENCOUNTER — Emergency Department
Admission: EM | Admit: 2017-11-17 | Discharge: 2017-11-18 | Disposition: A | Discharge status: Home / Self Care | Payer: Medicare Other | Attending: Emergency Medicine | Admitting: Emergency Medicine

## 2017-11-17 ENCOUNTER — Other Ambulatory Visit: Payer: Self-pay

## 2017-11-17 ENCOUNTER — Emergency Department: Payer: Medicare Other

## 2017-11-17 DIAGNOSIS — Z7984 Long term (current) use of oral hypoglycemic drugs: Secondary | ICD-10-CM | POA: Diagnosis not present

## 2017-11-17 DIAGNOSIS — Z87891 Personal history of nicotine dependence: Secondary | ICD-10-CM | POA: Insufficient documentation

## 2017-11-17 DIAGNOSIS — Z4682 Encounter for fitting and adjustment of non-vascular catheter: Secondary | ICD-10-CM | POA: Diagnosis not present

## 2017-11-17 DIAGNOSIS — Z978 Presence of other specified devices: Secondary | ICD-10-CM | POA: Insufficient documentation

## 2017-11-17 DIAGNOSIS — I10 Essential (primary) hypertension: Secondary | ICD-10-CM | POA: Diagnosis not present

## 2017-11-17 DIAGNOSIS — Z87820 Personal history of traumatic brain injury: Secondary | ICD-10-CM | POA: Insufficient documentation

## 2017-11-17 DIAGNOSIS — E119 Type 2 diabetes mellitus without complications: Secondary | ICD-10-CM | POA: Diagnosis not present

## 2017-11-17 DIAGNOSIS — Z7982 Long term (current) use of aspirin: Secondary | ICD-10-CM | POA: Diagnosis not present

## 2017-11-17 DIAGNOSIS — K922 Gastrointestinal hemorrhage, unspecified: Secondary | ICD-10-CM | POA: Insufficient documentation

## 2017-11-17 DIAGNOSIS — R112 Nausea with vomiting, unspecified: Secondary | ICD-10-CM

## 2017-11-17 DIAGNOSIS — Z79899 Other long term (current) drug therapy: Secondary | ICD-10-CM | POA: Insufficient documentation

## 2017-11-17 MED ORDER — DIATRIZOATE MEGLUMINE & SODIUM 66-10 % PO SOLN: 50.0000 mL | Freq: Once | ORAL | Status: DC

## 2017-11-17 NOTE — ED Notes (Signed)
Negative hemoccult per Dr. Mable Paris
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
## 2017-11-17 NOTE — ED Triage Notes (Signed)
Pt. Arrived via [facility] from  [facility].  Patient had feeding tube placed approximately 1 week ago.  Nursing staff noticed patient vomiting coffee ground emesis at some point today.

## 2017-11-18 DIAGNOSIS — Z9229 Personal history of other drug therapy: Secondary | ICD-10-CM | POA: Diagnosis not present

## 2017-11-18 DIAGNOSIS — K2901 Acute gastritis with bleeding: Secondary | ICD-10-CM | POA: Diagnosis not present

## 2017-11-18 LAB — CBC WITH DIFFERENTIAL/PLATELET
Abs Immature Granulocytes: 0.06 10*3/uL (ref 0.00–0.07)
Basophils Absolute: 0.1 10*3/uL (ref 0.0–0.1)
Basophils Relative: 1 %
EOS PCT: 2 %
Eosinophils Absolute: 0.2 10*3/uL (ref 0.0–0.5)
HCT: 42.3 % (ref 36.0–46.0)
HEMOGLOBIN: 13.8 g/dL (ref 12.0–15.0)
Immature Granulocytes: 1 %
LYMPHS ABS: 1.9 10*3/uL (ref 0.7–4.0)
LYMPHS PCT: 18 %
MCH: 29 pg (ref 26.0–34.0)
MCHC: 32.6 g/dL (ref 30.0–36.0)
MCV: 88.9 fL (ref 80.0–100.0)
Monocytes Absolute: 1.1 10*3/uL — ABNORMAL HIGH (ref 0.1–1.0)
Monocytes Relative: 10 %
Neutro Abs: 7.2 10*3/uL (ref 1.7–7.7)
Neutrophils Relative %: 68 %
Platelets: 348 10*3/uL (ref 150–400)
RBC: 4.76 MIL/uL (ref 3.87–5.11)
RDW: 12.8 % (ref 11.5–15.5)
WBC: 10.4 10*3/uL (ref 4.0–10.5)
nRBC: 0 % (ref 0.0–0.2)

## 2017-11-18 LAB — COMPREHENSIVE METABOLIC PANEL
ALT: 13 U/L (ref 0–44)
AST: 21 U/L (ref 15–41)
Albumin: 3.5 g/dL (ref 3.5–5.0)
Alkaline Phosphatase: 72 U/L (ref 38–126)
Anion gap: 11 (ref 5–15)
BUN: 16 mg/dL (ref 6–20)
CHLORIDE: 93 mmol/L — AB (ref 98–111)
CO2: 25 mmol/L (ref 22–32)
CREATININE: 0.46 mg/dL (ref 0.44–1.00)
Calcium: 9.2 mg/dL (ref 8.9–10.3)
GFR calc Af Amer: >60 mL/min (ref >60)
Glucose, Bld: 128 mg/dL — ABNORMAL HIGH (ref 70–99)
Potassium: 4.7 mmol/L (ref 3.5–5.1)
Sodium: 129 mmol/L — ABNORMAL LOW (ref 135–145)
Total Bilirubin: 0.6 mg/dL (ref 0.3–1.2)
Total Protein: 6.8 g/dL (ref 6.5–8.1)

## 2017-11-18 MED ORDER — PANTOPRAZOLE SODIUM 40 MG PO PACK: 40.0000 mg | PACK | Freq: Every day | ORAL | 0 refills | Status: DC

## 2017-11-18 MED ORDER — DIATRIZOATE MEGLUMINE & SODIUM 66-10 % PO SOLN
30.0000 mL | Freq: Once | ORAL | Status: DC
  Administered 2017-11-18: 30 mL

## 2017-11-18 NOTE — Discharge Instructions (Signed)
Please begin taking your antacid daily as prescribed and follow up with the gastroenterologist within 1 week for a recheck.  Return to the ED sooner for any concerns.  It was a pleasure to take care of you today, and thank you for coming to our emergency department.  If you have any questions or concerns before leaving please ask the nurse to grab me and I'm more than happy to go through your aftercare instructions again.  If you were prescribed any opioid pain medication today such as Norco, Vicodin, Percocet, morphine, hydrocodone, or oxycodone please make sure you do not drive when you are taking this medication as it can alter your ability to drive safely.  If you have any concerns once you are home that you are not improving or are in fact getting worse before you can make it to your follow-up appointment, please do not hesitate to call 911 and come back for further evaluation.  Darel Hong, MD  Results for orders placed or performed during the hospital encounter of 11/17/17  Comprehensive metabolic panel  Result Value Ref Range   Sodium 129 (L) 135 - 145 mmol/L   Potassium 4.7 3.5 - 5.1 mmol/L   Chloride 93 (L) 98 - 111 mmol/L   CO2 25 22 - 32 mmol/L   Glucose, Bld 128 (H) 70 - 99 mg/dL   BUN 16 6 - 20 mg/dL   Creatinine, Ser 0.46 0.44 - 1.00 mg/dL   Calcium 9.2 8.9 - 10.3 mg/dL   Total Protein 6.8 6.5 - 8.1 g/dL   Albumin 3.5 3.5 - 5.0 g/dL   AST 21 15 - 41 U/L   ALT 13 0 - 44 U/L   Alkaline Phosphatase 72 38 - 126 U/L   Total Bilirubin 0.6 0.3 - 1.2 mg/dL   GFR calc non Af Amer >60 >60 mL/min   GFR calc Af Amer >60 >60 mL/min   Anion gap 11 5 - 15  CBC with Differential  Result Value Ref Range   WBC 10.4 4.0 - 10.5 K/uL   RBC 4.76 3.87 - 5.11 MIL/uL   Hemoglobin 13.8 12.0 - 15.0 g/dL   HCT 42.3 36.0 - 46.0 %   MCV 88.9 80.0 - 100.0 fL   MCH 29.0 26.0 - 34.0 pg   MCHC 32.6 30.0 - 36.0 g/dL   RDW 12.8 11.5 - 15.5 %   Platelets 348 150 - 400 K/uL   nRBC 0.0 0.0 - 0.2  %   Neutrophils Relative % 68 %   Neutro Abs 7.2 1.7 - 7.7 K/uL   Lymphocytes Relative 18 %   Lymphs Abs 1.9 0.7 - 4.0 K/uL   Monocytes Relative 10 %   Monocytes Absolute 1.1 (H) 0.1 - 1.0 K/uL   Eosinophils Relative 2 %   Eosinophils Absolute 0.2 0.0 - 0.5 K/uL   Basophils Relative 1 %   Basophils Absolute 0.1 0.0 - 0.1 K/uL   Immature Granulocytes 1 %   Abs Immature Granulocytes 0.06 0.00 - 0.07 K/uL   Dg Abdomen 1 View  Result Date: 11/18/2017 CLINICAL DATA:  Coffee ground vomiting.  Gastrostomy tube. EXAM: ABDOMEN - 1 VIEW COMPARISON:  08/13/2017 FINDINGS: Contrast was injected through the gastrostomy tube which is noted in the stomach. No evidence of contrast extravasation. No free air organomegaly. Prior cholecystectomy. No evidence of bowel obstruction. IMPRESSION: Contrast material injected through the gastrostomy tube is noted within the stomach. No extravasation. No acute findings. Electronically Signed   By: Lennette Bihari  [place] M.D.   On: 11/18/2017 00:31   Ir Gastrostomy Tube Mod Sed  Result Date: 11/12/2017 INDICATION: 57 year old female with spastic quadriparesis and frequent aspiration. She presents for percutaneous gastrostomy tube placement. EXAM: Fluoroscopically guided placement of percutaneous pull-through gastrostomy tube Interventional Radiologist:  Criselda Peaches, MD MEDICATIONS: 2 g Ancef; Antibiotics were administered within 1 hour of the procedure. ANESTHESIA/SEDATION: Versed 1 mg IV; Fentanyl 50 mcg IV Moderate Sedation Time:  11 minutes The patient was continuously monitored during the procedure by the interventional radiology nurse under my direct supervision. CONTRAST:  74mL ISOVUE-300 IOPAMIDOL (ISOVUE-300) INJECTION 61% FLUOROSCOPY TIME:  Fluoroscopy Time: 3 minutes 36 seconds (43 mGy). COMPLICATIONS: None immediate. PROCEDURE: Informed written consent was obtained from the patient after a thorough discussion of the procedural risks, benefits and alternatives. All  questions were addressed. Maximal Sterile Barrier Technique was utilized including caps, mask, sterile gowns, sterile gloves, sterile drape, hand hygiene and skin antiseptic. A timeout was performed prior to the initiation of the procedure. Maximal barrier sterile technique utilized including caps, mask, sterile gowns, sterile gloves, large sterile drape, hand hygiene, and chlorhexadine skin prep. An angled catheter was advanced over a wire under fluoroscopic guidance through the nose, down the esophagus and into the body of the stomach. The stomach was then insufflated with several 100 ml of air. Fluoroscopy confirmed location of the gastric bubble, as well as inferior displacement of the barium stained colon. Under direct fluoroscopic guidance, a single T-tack was placed, and the anterior gastric wall drawn up against the anterior abdominal wall. Percutaneous access was then obtained into the mid gastric body with an 18 gauge sheath needle. Aspiration of air, and injection of contrast material under fluoroscopy confirmed needle placement. An Amplatz wire was advanced in the gastric body and the access needle exchanged for a 9-French vascular sheath. A snare device was advanced through the vascular sheath and an Amplatz wire advanced through the angled catheter. The Amplatz wire was successfully snared and this was pulled up through the esophagus and out the mouth. A 20-French Alinda Dooms MIC-PEG tube was then connected to the snare and pulled through the mouth, down the esophagus, into the stomach and out to the anterior abdominal wall. Hand injection of contrast material confirmed intragastric location. The T-tack retention suture was then cut. The pull through peg tube was then secured with the external bumper and capped. The patient will be observed for several hours with the newly placed tube on low wall suction to evaluate for any post procedure complication. The patient tolerated the procedure well, there  is no immediate complication. IMPRESSION: Successful placement of a 20 French pull through gastrostomy tube. Electronically Signed   By: Jacqulynn Cadet M.D.   On: 11/12/2017 12:24

## 2017-11-18 NOTE — ED Notes (Signed)
Patient has legal guardian Zena Amos [phone] (sister) was notified.

## 2017-11-23 DIAGNOSIS — E871 Hypo-osmolality and hyponatremia: Secondary | ICD-10-CM | POA: Diagnosis not present

## 2017-11-25 DIAGNOSIS — K2270 Barrett's esophagus without dysplasia: Secondary | ICD-10-CM | POA: Diagnosis not present

## 2017-11-25 DIAGNOSIS — K2970 Gastritis, unspecified, without bleeding: Secondary | ICD-10-CM | POA: Diagnosis not present

## 2017-12-02 DIAGNOSIS — Z79899 Other long term (current) drug therapy: Secondary | ICD-10-CM | POA: Diagnosis not present

## 2017-12-06 DIAGNOSIS — E871 Hypo-osmolality and hyponatremia: Secondary | ICD-10-CM | POA: Diagnosis not present

## 2017-12-06 DIAGNOSIS — Z79899 Other long term (current) drug therapy: Secondary | ICD-10-CM | POA: Diagnosis not present

## 2017-12-06 DIAGNOSIS — D649 Anemia, unspecified: Secondary | ICD-10-CM | POA: Diagnosis not present

## 2017-12-21 DIAGNOSIS — E871 Hypo-osmolality and hyponatremia: Secondary | ICD-10-CM | POA: Diagnosis not present

## 2017-12-21 DIAGNOSIS — I69351 Hemiplegia and hemiparesis following cerebral infarction affecting right dominant side: Secondary | ICD-10-CM | POA: Diagnosis not present

## 2017-12-21 DIAGNOSIS — R1312 Dysphagia, oropharyngeal phase: Secondary | ICD-10-CM | POA: Diagnosis not present

## 2017-12-24 DIAGNOSIS — F329 Major depressive disorder, single episode, unspecified: Secondary | ICD-10-CM | POA: Insufficient documentation

## 2017-12-24 DIAGNOSIS — F32A Depression, unspecified: Secondary | ICD-10-CM | POA: Insufficient documentation

## 2017-12-30 DIAGNOSIS — F0390 Unspecified dementia without behavioral disturbance: Secondary | ICD-10-CM | POA: Diagnosis not present

## 2017-12-30 DIAGNOSIS — E871 Hypo-osmolality and hyponatremia: Secondary | ICD-10-CM | POA: Diagnosis not present

## 2017-12-30 DIAGNOSIS — F329 Major depressive disorder, single episode, unspecified: Secondary | ICD-10-CM | POA: Diagnosis not present

## 2017-12-30 DIAGNOSIS — D649 Anemia, unspecified: Secondary | ICD-10-CM | POA: Diagnosis not present

## 2018-01-06 DIAGNOSIS — D649 Anemia, unspecified: Secondary | ICD-10-CM | POA: Diagnosis not present

## 2018-01-06 DIAGNOSIS — E871 Hypo-osmolality and hyponatremia: Secondary | ICD-10-CM | POA: Diagnosis not present

## 2018-01-21 DIAGNOSIS — Z79899 Other long term (current) drug therapy: Secondary | ICD-10-CM | POA: Diagnosis not present

## 2018-01-25 DIAGNOSIS — Z79899 Other long term (current) drug therapy: Secondary | ICD-10-CM | POA: Diagnosis not present

## 2020-01-30 ENCOUNTER — Other Ambulatory Visit: Payer: Self-pay | Admitting: Adult Health Nurse Practitioner

## 2020-01-30 ENCOUNTER — Telehealth (HOSPITAL_COMMUNITY): Payer: Self-pay | Admitting: Physician Assistant

## 2020-01-30 DIAGNOSIS — K9423 Gastrostomy malfunction: Secondary | ICD-10-CM

## 2020-01-30 NOTE — Telephone Encounter (Signed)
Patient planned for G tube exchange tomorrow with IR - discussed procedure with patient's sister Lendon Colonel via phone today, all questions answered and she is agreeable to proceed. Verbal consent obtained today and placed with IR RN.  Lynnette Caffey, PA-C

## 2020-01-31 ENCOUNTER — Ambulatory Visit
Admission: RE | Admit: 2020-01-31 | Discharge: 2020-01-31 | Disposition: A | Discharge status: Home / Self Care | Payer: Medicare Other | Source: Ambulatory Visit | Attending: Adult Health Nurse Practitioner | Admitting: Adult Health Nurse Practitioner

## 2020-01-31 ENCOUNTER — Other Ambulatory Visit: Payer: Self-pay

## 2020-01-31 DIAGNOSIS — K9423 Gastrostomy malfunction: Secondary | ICD-10-CM | POA: Diagnosis present

## 2020-01-31 DIAGNOSIS — R1310 Dysphagia, unspecified: Secondary | ICD-10-CM | POA: Insufficient documentation

## 2020-01-31 MED ORDER — IOHEXOL 300 MG/ML  SOLN
30.0000 mL | Freq: Once | INTRAMUSCULAR | Status: AC | PRN
  Administered 2020-01-31: 30 mL

## 2020-01-31 NOTE — Procedures (Signed)
Interventional Radiology Procedure Note  Procedure: Gastrostomy tube exchange  Findings: Please refer to procedural dictation for full description. Exchanged for 20 Fr balloon retention gastrostomy.  Complications: Fractured g-tube disc from indwelling tube, remains within stomach.  Estimated Blood Loss: None  Recommendations: Observe for signs/symptoms of bowel obstruction over next week due to fractured gastrostomy disc.  Hopefully disc will pass spontaneously.   Marliss Coots, MD

## 2020-07-15 ENCOUNTER — Encounter: Payer: Self-pay | Admitting: Emergency Medicine

## 2020-07-15 ENCOUNTER — Emergency Department: Payer: Medicare Other

## 2020-07-15 ENCOUNTER — Other Ambulatory Visit: Payer: Self-pay

## 2020-07-15 ENCOUNTER — Emergency Department
Admission: EM | Admit: 2020-07-15 | Discharge: 2020-07-15 | Disposition: A | Discharge status: Home / Self Care | Payer: Medicare Other | Attending: Emergency Medicine | Admitting: Emergency Medicine

## 2020-07-15 DIAGNOSIS — Z7982 Long term (current) use of aspirin: Secondary | ICD-10-CM | POA: Insufficient documentation

## 2020-07-15 DIAGNOSIS — Z79899 Other long term (current) drug therapy: Secondary | ICD-10-CM | POA: Diagnosis not present

## 2020-07-15 DIAGNOSIS — E119 Type 2 diabetes mellitus without complications: Secondary | ICD-10-CM | POA: Insufficient documentation

## 2020-07-15 DIAGNOSIS — K9420 Gastrostomy complication, unspecified: Secondary | ICD-10-CM | POA: Insufficient documentation

## 2020-07-15 DIAGNOSIS — T85848A Pain due to other internal prosthetic devices, implants and grafts, initial encounter: Secondary | ICD-10-CM

## 2020-07-15 DIAGNOSIS — Z7984 Long term (current) use of oral hypoglycemic drugs: Secondary | ICD-10-CM | POA: Diagnosis not present

## 2020-07-15 DIAGNOSIS — I10 Essential (primary) hypertension: Secondary | ICD-10-CM | POA: Insufficient documentation

## 2020-07-15 DIAGNOSIS — R1012 Left upper quadrant pain: Secondary | ICD-10-CM | POA: Diagnosis present

## 2020-07-15 LAB — COMPREHENSIVE METABOLIC PANEL
ALT: 14 U/L (ref 0–44)
AST: 14 U/L — ABNORMAL LOW (ref 15–41)
Albumin: 3.2 g/dL — ABNORMAL LOW (ref 3.5–5.0)
Alkaline Phosphatase: 93 U/L (ref 38–126)
Anion gap: 6 (ref 5–15)
BUN: 14 mg/dL (ref 8–23)
CO2: 29 mmol/L (ref 22–32)
Calcium: 9.1 mg/dL (ref 8.9–10.3)
Chloride: 99 mmol/L (ref 98–111)
Creatinine, Ser: 0.55 mg/dL (ref 0.44–1.00)
GFR, Estimated: >60 mL/min (ref >60)
Glucose, Bld: 149 mg/dL — ABNORMAL HIGH (ref 70–99)
Potassium: 4.7 mmol/L (ref 3.5–5.1)
Sodium: 134 mmol/L — ABNORMAL LOW (ref 135–145)
Total Bilirubin: 0.5 mg/dL (ref 0.3–1.2)
Total Protein: 6.6 g/dL (ref 6.5–8.1)

## 2020-07-15 LAB — CBC
HCT: 38.2 % (ref 36.0–46.0)
Hemoglobin: 12.9 g/dL (ref 12.0–15.0)
MCH: 30.2 pg (ref 26.0–34.0)
MCHC: 33.8 g/dL (ref 30.0–36.0)
MCV: 89.5 fL (ref 80.0–100.0)
Platelets: 282 10*3/uL (ref 150–400)
RBC: 4.27 MIL/uL (ref 3.87–5.11)
RDW: 13.3 % (ref 11.5–15.5)
WBC: 8.6 10*3/uL (ref 4.0–10.5)
nRBC: 0 % (ref 0.0–0.2)

## 2020-07-15 LAB — LIPASE, BLOOD: Lipase: 32 U/L (ref 11–51)

## 2020-07-15 MED ORDER — DIATRIZOATE MEGLUMINE & SODIUM 66-10 % PO SOLN
30.0000 mL | Freq: Once | ORAL | Status: AC
  Administered 2020-07-15: 30 mL

## 2020-07-15 NOTE — ED Triage Notes (Signed)
Pt in via EMS from [facility] with c/o needing a g-tube placement checked. Pt also reports some abd pain since replacing it. 120/70, HR 73, 96% RA

## 2020-07-15 NOTE — ED Notes (Signed)
Lab called to come get blood from pt.
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
## 2020-07-15 NOTE — ED Notes (Signed)
Called [facility] to transport patient back to [facility]

## 2020-07-15 NOTE — ED Notes (Signed)
Medical necessity form given to secretary to arrange ambulance transport back to facility.

## 2020-07-15 NOTE — ED Notes (Signed)
Attempt made to call report to facility with no answer.

## 2020-09-16 ENCOUNTER — Emergency Department: Payer: Medicare Other

## 2020-09-16 ENCOUNTER — Other Ambulatory Visit: Payer: Self-pay

## 2020-09-16 ENCOUNTER — Observation Stay
Admission: EM | Admit: 2020-09-16 | Discharge: 2020-09-18 | Disposition: A | Discharge status: Home / Self Care | Payer: Medicare Other | Attending: Internal Medicine | Admitting: Internal Medicine

## 2020-09-16 DIAGNOSIS — Z87891 Personal history of nicotine dependence: Secondary | ICD-10-CM | POA: Diagnosis not present

## 2020-09-16 DIAGNOSIS — Z79899 Other long term (current) drug therapy: Secondary | ICD-10-CM | POA: Diagnosis not present

## 2020-09-16 DIAGNOSIS — R4189 Other symptoms and signs involving cognitive functions and awareness: Secondary | ICD-10-CM | POA: Diagnosis not present

## 2020-09-16 DIAGNOSIS — E119 Type 2 diabetes mellitus without complications: Secondary | ICD-10-CM | POA: Diagnosis not present

## 2020-09-16 DIAGNOSIS — F32A Depression, unspecified: Secondary | ICD-10-CM

## 2020-09-16 DIAGNOSIS — Z7984 Long term (current) use of oral hypoglycemic drugs: Secondary | ICD-10-CM | POA: Diagnosis not present

## 2020-09-16 DIAGNOSIS — Z7982 Long term (current) use of aspirin: Secondary | ICD-10-CM | POA: Diagnosis not present

## 2020-09-16 DIAGNOSIS — K920 Hematemesis: Secondary | ICD-10-CM | POA: Diagnosis present

## 2020-09-16 DIAGNOSIS — R532 Functional quadriplegia: Secondary | ICD-10-CM

## 2020-09-16 DIAGNOSIS — K922 Gastrointestinal hemorrhage, unspecified: Secondary | ICD-10-CM | POA: Diagnosis not present

## 2020-09-16 DIAGNOSIS — K209 Esophagitis, unspecified without bleeding: Secondary | ICD-10-CM | POA: Diagnosis not present

## 2020-09-16 DIAGNOSIS — Z20822 Contact with and (suspected) exposure to covid-19: Secondary | ICD-10-CM | POA: Diagnosis not present

## 2020-09-16 DIAGNOSIS — I10 Essential (primary) hypertension: Secondary | ICD-10-CM | POA: Insufficient documentation

## 2020-09-16 DIAGNOSIS — I739 Peripheral vascular disease, unspecified: Secondary | ICD-10-CM

## 2020-09-16 DIAGNOSIS — N390 Urinary tract infection, site not specified: Principal | ICD-10-CM | POA: Insufficient documentation

## 2020-09-16 DIAGNOSIS — G8250 Quadriplegia, unspecified: Secondary | ICD-10-CM | POA: Diagnosis present

## 2020-09-16 LAB — CBC
HCT: 44.5 % (ref 36.0–46.0)
Hemoglobin: 15 g/dL (ref 12.0–15.0)
MCH: 30.5 pg (ref 26.0–34.0)
MCHC: 33.7 g/dL (ref 30.0–36.0)
MCV: 90.4 fL (ref 80.0–100.0)
Platelets: 283 10*3/uL (ref 150–400)
RBC: 4.92 MIL/uL (ref 3.87–5.11)
RDW: 12.9 % (ref 11.5–15.5)
WBC: 17.3 10*3/uL — ABNORMAL HIGH (ref 4.0–10.5)
nRBC: 0 % (ref 0.0–0.2)

## 2020-09-16 LAB — URINALYSIS, COMPLETE (UACMP) WITH MICROSCOPIC
Bilirubin Urine: NEGATIVE
Glucose, UA: >1000 mg/dL — AB
Ketones, ur: NEGATIVE mg/dL
Nitrite: POSITIVE — AB
Protein, ur: NEGATIVE mg/dL
Specific Gravity, Urine: 1.015 (ref 1.005–1.030)
WBC, UA: >50 WBC/hpf (ref 0–5)
pH: 7 (ref 5.0–8.0)

## 2020-09-16 LAB — COMPREHENSIVE METABOLIC PANEL
ALT: 12 U/L (ref 0–44)
AST: 18 U/L (ref 15–41)
Albumin: 3.6 g/dL (ref 3.5–5.0)
Alkaline Phosphatase: 102 U/L (ref 38–126)
Anion gap: 8 (ref 5–15)
BUN: 22 mg/dL (ref 8–23)
CO2: 27 mmol/L (ref 22–32)
Calcium: 9.7 mg/dL (ref 8.9–10.3)
Chloride: 99 mmol/L (ref 98–111)
Creatinine, Ser: 0.62 mg/dL (ref 0.44–1.00)
GFR, Estimated: >60 mL/min (ref >60)
Glucose, Bld: 272 mg/dL — ABNORMAL HIGH (ref 70–99)
Potassium: 5 mmol/L (ref 3.5–5.1)
Sodium: 134 mmol/L — ABNORMAL LOW (ref 135–145)
Total Bilirubin: 0.5 mg/dL (ref 0.3–1.2)
Total Protein: 7.1 g/dL (ref 6.5–8.1)

## 2020-09-16 LAB — GLUCOSE, CAPILLARY
Glucose-Capillary: 104 mg/dL — ABNORMAL HIGH (ref 70–99)
Glucose-Capillary: 175 mg/dL — ABNORMAL HIGH (ref 70–99)
Glucose-Capillary: 64 mg/dL — ABNORMAL LOW (ref 70–99)

## 2020-09-16 LAB — RESP PANEL BY RT-PCR (FLU A&B, COVID) ARPGX2
Influenza A by PCR: NEGATIVE
Influenza B by PCR: NEGATIVE
SARS Coronavirus 2 by RT PCR: NEGATIVE

## 2020-09-16 LAB — LIPASE, BLOOD: Lipase: 27 U/L (ref 11–51)

## 2020-09-16 LAB — HEMOGLOBIN A1C
Hgb A1c MFr Bld: 6.7 % — ABNORMAL HIGH (ref 4.8–5.6)
Mean Plasma Glucose: 145.59 mg/dL

## 2020-09-16 LAB — HIV ANTIBODY (ROUTINE TESTING W REFLEX): HIV Screen 4th Generation wRfx: NONREACTIVE

## 2020-09-16 MED ORDER — DONEPEZIL HCL 5 MG PO TABS: 10.0000 mg | ORAL_TABLET | Freq: Every day | ORAL | Status: DC

## 2020-09-16 MED ORDER — ONDANSETRON HCL 4 MG PO TABS: 4.0000 mg | ORAL_TABLET | Freq: Four times a day (QID) | ORAL | Status: DC | PRN

## 2020-09-16 MED ORDER — IPRATROPIUM-ALBUTEROL 0.5-2.5 (3) MG/3ML IN SOLN
3.0000 mL | Freq: Two times a day (BID) | RESPIRATORY_TRACT | Status: DC
  Administered 2020-09-17 – 2020-09-18 (×3): 3 mL via RESPIRATORY_TRACT
  Filled 2020-09-16 (×3): qty 3

## 2020-09-16 MED ORDER — MEMANTINE HCL ER 28 MG PO CP24
28.0000 mg | ORAL_CAPSULE | Freq: Every day | ORAL | Status: DC
  Administered 2020-09-17 – 2020-09-18 (×2): 28 mg via ORAL
  Filled 2020-09-16 (×2): qty 1

## 2020-09-16 MED ORDER — INSULIN ASPART 100 UNIT/ML IJ SOLN
0.0000 [IU] | INTRAMUSCULAR | Status: DC
  Administered 2020-09-16 – 2020-09-18 (×3): 5 [IU] via SUBCUTANEOUS
  Filled 2020-09-16 (×3): qty 1

## 2020-09-16 MED ORDER — METOPROLOL TARTRATE 25 MG PO TABS
12.5000 mg | ORAL_TABLET | Freq: Two times a day (BID) | ORAL | Status: DC
  Administered 2020-09-16 – 2020-09-18 (×4): 12.5 mg
  Filled 2020-09-16 (×5): qty 1

## 2020-09-16 MED ORDER — ALBUTEROL SULFATE (2.5 MG/3ML) 0.083% IN NEBU: 3.0000 mL | INHALATION_SOLUTION | RESPIRATORY_TRACT | Status: DC | PRN

## 2020-09-16 MED ORDER — CHOLECALCIFEROL 10 MCG (400 UNIT) PO TABS
400.0000 [IU] | ORAL_TABLET | Freq: Two times a day (BID) | ORAL | Status: DC
  Filled 2020-09-16: qty 1

## 2020-09-16 MED ORDER — SODIUM CHLORIDE 0.9 % IV SOLN: INTRAVENOUS | Status: DC

## 2020-09-16 MED ORDER — OXYBUTYNIN CHLORIDE 5 MG PO TABS
5.0000 mg | ORAL_TABLET | Freq: Three times a day (TID) | ORAL | Status: DC
  Administered 2020-09-16 – 2020-09-18 (×5): 5 mg via ORAL
  Filled 2020-09-16 (×8): qty 1

## 2020-09-16 MED ORDER — DEXTROSE 50 % IV SOLN
12.5000 g | Freq: Once | INTRAVENOUS | Status: AC
  Administered 2020-09-16: 12.5 g via INTRAVENOUS

## 2020-09-16 MED ORDER — BISACODYL 10 MG RE SUPP
10.0000 mg | RECTAL | Status: DC | PRN
  Filled 2020-09-16: qty 1

## 2020-09-16 MED ORDER — RAMIPRIL 5 MG PO CAPS
5.0000 mg | ORAL_CAPSULE | Freq: Every day | ORAL | Status: DC
  Administered 2020-09-18: 5 mg via ORAL
  Filled 2020-09-16 (×2): qty 1

## 2020-09-16 MED ORDER — ACIDOPHILUS LACTOBACILLUS PO CAPS: ORAL_CAPSULE | Freq: Every day | ORAL | Status: DC

## 2020-09-16 MED ORDER — VITAMIN D 25 MCG (1000 UNIT) PO TABS
500.0000 [IU] | ORAL_TABLET | Freq: Two times a day (BID) | ORAL | Status: DC
  Administered 2020-09-17 – 2020-09-18 (×3): 500 [IU] via ORAL
  Filled 2020-09-16 (×3): qty 1

## 2020-09-16 MED ORDER — CILOSTAZOL 100 MG PO TABS
50.0000 mg | ORAL_TABLET | Freq: Two times a day (BID) | ORAL | Status: DC
  Administered 2020-09-16 – 2020-09-18 (×4): 50 mg via ORAL
  Filled 2020-09-16 (×6): qty 0.5

## 2020-09-16 MED ORDER — FLUOXETINE HCL 20 MG/5ML PO SOLN
5.0000 mg | Freq: Every day | ORAL | Status: DC
  Administered 2020-09-17 – 2020-09-18 (×2): 5 mg
  Filled 2020-09-16 (×2): qty 1.25

## 2020-09-16 MED ORDER — DEXTROSE 50 % IV SOLN
INTRAVENOUS | Status: AC
  Filled 2020-09-16: qty 50

## 2020-09-16 MED ORDER — FERROUS SULFATE 220 (44 FE) MG/5ML PO ELIX
330.0000 mg | ORAL_SOLUTION | Freq: Every day | ORAL | Status: DC
  Administered 2020-09-17 – 2020-09-18 (×2): 330 mg via ORAL
  Filled 2020-09-16 (×2): qty 7.5

## 2020-09-16 MED ORDER — NORTRIPTYLINE HCL 25 MG PO CAPS
25.0000 mg | ORAL_CAPSULE | Freq: Every day | ORAL | Status: DC
  Administered 2020-09-17 – 2020-09-18 (×2): 25 mg via ORAL
  Filled 2020-09-16 (×2): qty 1

## 2020-09-16 MED ORDER — FERROUS SULFATE 325 (65 FE) MG PO TABS: 325.0000 mg | ORAL_TABLET | Freq: Every day | ORAL | Status: DC

## 2020-09-16 MED ORDER — CLONIDINE HCL 0.1 MG PO TABS: 0.1000 mg | ORAL_TABLET | Freq: Four times a day (QID) | ORAL | Status: DC | PRN

## 2020-09-16 MED ORDER — SIMVASTATIN 20 MG PO TABS
10.0000 mg | ORAL_TABLET | Freq: Every day | ORAL | Status: DC
  Administered 2020-09-16 – 2020-09-17 (×2): 10 mg via ORAL
  Filled 2020-09-16 (×2): qty 1

## 2020-09-16 MED ORDER — POLYVINYL ALCOHOL 1.4 % OP SOLN
1.0000 [drp] | OPHTHALMIC | Status: DC | PRN
  Filled 2020-09-16: qty 15

## 2020-09-16 MED ORDER — PANTOPRAZOLE 80MG IVPB - SIMPLE MED
80.0000 mg | Freq: Once | INTRAVENOUS | Status: AC
  Administered 2020-09-16: 80 mg via INTRAVENOUS
  Filled 2020-09-16: qty 80

## 2020-09-16 MED ORDER — CARBOXYMETHYLCELLULOSE SODIUM 0.5 % OP SOLN: 2.0000 [drp] | Freq: Three times a day (TID) | OPHTHALMIC | Status: DC | PRN

## 2020-09-16 MED ORDER — RISAQUAD PO CAPS
1.0000 | ORAL_CAPSULE | Freq: Every day | ORAL | Status: DC
  Administered 2020-09-17 – 2020-09-18 (×2): 1 via ORAL
  Filled 2020-09-16 (×2): qty 1

## 2020-09-16 MED ORDER — FLUOXETINE HCL 20 MG PO TABS: 30.0000 mg | ORAL_TABLET | ORAL | Status: DC

## 2020-09-16 MED ORDER — LURASIDONE HCL 40 MG PO TABS
20.0000 mg | ORAL_TABLET | Freq: Every day | ORAL | Status: DC
  Administered 2020-09-17 – 2020-09-18 (×2): 20 mg via ORAL
  Filled 2020-09-16 (×2): qty 1

## 2020-09-16 MED ORDER — DONEPEZIL HCL 5 MG PO TABS
10.0000 mg | ORAL_TABLET | Freq: Every day | ORAL | Status: DC
  Administered 2020-09-16 – 2020-09-17 (×2): 10 mg via ORAL
  Filled 2020-09-16 (×2): qty 2

## 2020-09-16 MED ORDER — SCOPOLAMINE 1 MG/3DAYS TD PT72
1.0000 | MEDICATED_PATCH | TRANSDERMAL | Status: DC
  Administered 2020-09-17: 1.5 mg via TRANSDERMAL
  Filled 2020-09-16: qty 1

## 2020-09-16 MED ORDER — ONDANSETRON HCL 4 MG/2ML IJ SOLN: 4.0000 mg | Freq: Four times a day (QID) | INTRAMUSCULAR | Status: DC | PRN

## 2020-09-16 MED ORDER — DESMOPRESSIN ACETATE 0.2 MG PO TABS
200.0000 ug | ORAL_TABLET | Freq: Every day | ORAL | Status: DC
  Administered 2020-09-16 – 2020-09-17 (×2): 200 ug via ORAL
  Filled 2020-09-16 (×4): qty 1

## 2020-09-16 MED ORDER — METOPROLOL SUCCINATE ER 50 MG PO TB24: 25.0000 mg | ORAL_TABLET | Freq: Every day | ORAL | Status: DC

## 2020-09-16 MED ORDER — SODIUM CHLORIDE 0.9 % IV SOLN
1.0000 g | INTRAVENOUS | Status: DC
  Administered 2020-09-16: 1 g via INTRAVENOUS
  Filled 2020-09-16 (×2): qty 10

## 2020-09-16 MED ORDER — ASPIRIN 81 MG PO CHEW
81.0000 mg | CHEWABLE_TABLET | Freq: Every day | ORAL | Status: DC
  Administered 2020-09-17 – 2020-09-18 (×2): 81 mg
  Filled 2020-09-16 (×2): qty 1

## 2020-09-16 MED ORDER — CALCIUM CARBONATE-VITAMIN D 500-200 MG-UNIT PO TABS
1.0000 | ORAL_TABLET | Freq: Two times a day (BID) | ORAL | Status: DC
  Administered 2020-09-16 – 2020-09-18 (×4): 1 via ORAL
  Filled 2020-09-16 (×4): qty 1

## 2020-09-16 MED ORDER — ACETAMINOPHEN 650 MG RE SUPP: 650.0000 mg | Freq: Four times a day (QID) | RECTAL | Status: DC | PRN

## 2020-09-16 MED ORDER — ACETAMINOPHEN 325 MG PO TABS: 650.0000 mg | ORAL_TABLET | Freq: Four times a day (QID) | ORAL | Status: DC | PRN

## 2020-09-16 MED ORDER — SODIUM CHLORIDE 1 G PO TABS
1.0000 g | ORAL_TABLET | Freq: Two times a day (BID) | ORAL | Status: DC
  Administered 2020-09-16 – 2020-09-18 (×4): 1 g
  Filled 2020-09-16 (×6): qty 1

## 2020-09-16 MED ORDER — PANTOPRAZOLE SODIUM 40 MG IV SOLR
40.0000 mg | INTRAVENOUS | Status: DC
  Administered 2020-09-17: 40 mg via INTRAVENOUS
  Filled 2020-09-16: qty 40

## 2020-09-16 MED ORDER — FESOTERODINE FUMARATE ER 4 MG PO TB24
4.0000 mg | ORAL_TABLET | Freq: Every day | ORAL | Status: DC
  Administered 2020-09-17 – 2020-09-18 (×2): 4 mg via ORAL
  Filled 2020-09-16 (×2): qty 1

## 2020-09-16 NOTE — Progress Notes (Signed)
Hypoglycemic Event  CBG: 64  Treatment: D 50 12.'5mg'$   Symptoms: Sweating  Follow-up CBG: Time:2130 CBG Result:175  Possible Reasons for Event: Inadequate meal intake  Comments/MD notified:Brenda Althea Grimmer, NP    Laretta Alstrom
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
## 2020-09-16 NOTE — ED Notes (Signed)
Legal guardian Zena Amos called at [phone] and informed of pt status and admittance to hospital.
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
## 2020-09-16 NOTE — ED Notes (Signed)
Patient transported to CT 
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
## 2020-09-16 NOTE — ED Triage Notes (Signed)
Pt come with c/p projectile vomiting. Pt is from [facility]. EMs reports staff was feeding pt through her feeding tube when she began to have this vomiting episode.  Pt is bed bound. Staff reports coffee ground emesis.  Bp-123/82 ST-112 T-97.2 96% RA CBG-298

## 2020-09-17 DIAGNOSIS — K922 Gastrointestinal hemorrhage, unspecified: Secondary | ICD-10-CM | POA: Diagnosis not present

## 2020-09-17 DIAGNOSIS — R809 Proteinuria, unspecified: Secondary | ICD-10-CM

## 2020-09-17 DIAGNOSIS — E1129 Type 2 diabetes mellitus with other diabetic kidney complication: Secondary | ICD-10-CM

## 2020-09-17 DIAGNOSIS — N3001 Acute cystitis with hematuria: Secondary | ICD-10-CM

## 2020-09-17 DIAGNOSIS — R532 Functional quadriplegia: Secondary | ICD-10-CM | POA: Diagnosis not present

## 2020-09-17 LAB — GLUCOSE, CAPILLARY
Glucose-Capillary: 97 mg/dL (ref 70–99)
Glucose-Capillary: 96 mg/dL (ref 70–99)
Glucose-Capillary: 117 mg/dL — ABNORMAL HIGH (ref 70–99)
Glucose-Capillary: 92 mg/dL (ref 70–99)
Glucose-Capillary: 204 mg/dL — ABNORMAL HIGH (ref 70–99)

## 2020-09-17 LAB — CBC
HCT: 38.7 % (ref 36.0–46.0)
Hemoglobin: 12.6 g/dL (ref 12.0–15.0)
MCH: 29.9 pg (ref 26.0–34.0)
MCHC: 32.6 g/dL (ref 30.0–36.0)
MCV: 91.7 fL (ref 80.0–100.0)
Platelets: 235 10*3/uL (ref 150–400)
RBC: 4.22 MIL/uL (ref 3.87–5.11)
RDW: 13.3 % (ref 11.5–15.5)
WBC: 9.1 10*3/uL (ref 4.0–10.5)
nRBC: 0 % (ref 0.0–0.2)

## 2020-09-17 LAB — BASIC METABOLIC PANEL
Anion gap: 8 (ref 5–15)
BUN: 16 mg/dL (ref 8–23)
CO2: 26 mmol/L (ref 22–32)
Calcium: 8.8 mg/dL — ABNORMAL LOW (ref 8.9–10.3)
Chloride: 106 mmol/L (ref 98–111)
Creatinine, Ser: 0.61 mg/dL (ref 0.44–1.00)
GFR, Estimated: >60 mL/min (ref >60)
Glucose, Bld: 97 mg/dL (ref 70–99)
Potassium: 4.2 mmol/L (ref 3.5–5.1)
Sodium: 140 mmol/L (ref 135–145)

## 2020-09-17 MED ORDER — CEPHALEXIN 250 MG/5ML PO SUSR: 500.0000 mg | Freq: Three times a day (TID) | ORAL | 0 refills | Status: AC

## 2020-09-17 MED ORDER — OMEPRAZOLE 20 MG PO CPDR: 40.0000 mg | DELAYED_RELEASE_CAPSULE | Freq: Two times a day (BID) | ORAL | Status: DC

## 2020-09-17 MED ORDER — FREE WATER
100.0000 mL | Status: DC
  Administered 2020-09-17 – 2020-09-18 (×6): 100 mL

## 2020-09-17 MED ORDER — OSMOLITE 1.5 CAL PO LIQD
237.0000 mL | Freq: Every day | ORAL | Status: DC
  Administered 2020-09-17 – 2020-09-18 (×4): 237 mL

## 2020-09-17 MED ORDER — CEPHALEXIN 250 MG/5ML PO SUSR
500.0000 mg | Freq: Three times a day (TID) | ORAL | Status: DC
  Administered 2020-09-17 – 2020-09-18 (×2): 500 mg
  Filled 2020-09-17 (×5): qty 10

## 2020-09-17 NOTE — Progress Notes (Signed)
Notified by CCMD that patient's HR was elevated and sustaining in the 140s. This nurse went in to assess the patient and found her asleep. After notifying the MD, metoprolol was given that was held this morning. Hr decreased to 105-108. MD made aware, D/C cancelled to monitor overnight. Oncoming nurse made aware, will continue to monitor.

## 2020-09-17 NOTE — TOC Progression Note (Signed)
Transition of Care [facility]) - Progression Note    Patient Details  Name: [name] [name] MRN: [id number] Date of Birth: February 14, 1960  Transition of Care [facility]) CM/SW [place], RN Phone Number: 09/17/2020, 1:34 PM  Clinical Narrative:      Damaris Schooner with Linwood Dibbles the patient's sister and made her aware of the Code 48 and that the patient will return to [facility] to room 105A, she stated understanding      Expected Discharge Plan and Services           Expected Discharge Date: 09/17/20                                     Social Determinants of Health (SDOH) Interventions    Readmission Risk Interventions No flowsheet data found.

## 2020-09-17 NOTE — Progress Notes (Signed)
Brief Nutrition Support Note  Pt with PEG for feeds set to be dc back to facility today. Received message from RN that MD wishes to test PEG prior to dc to ensure pt tolerates her feeds without vomiting. No admissions or RD notes present in chart review in several years.  [place] and spoke with Therapist, sports. Reports that pt is on a bolus feeding schedule. Receives 5 bolus feeds (1 carton each) of Osmolite 1.5 with 131m free water flushes q4h. Entered recommendations and brought a carton of formula to pt's room so tolerance could be assessed. Messaged RN.   Current home TF regimen provides 1778kcal, 74g of protein, and 15054mof free water daily  RaRanell PatrickRD, LDN Clinical Dietitian Pager on Amion

## 2020-09-17 NOTE — NC FL2 (Signed)
[place] LEVEL OF CARE SCREENING TOOL     IDENTIFICATION  Patient Name: [name] [name] Birthdate: [birth year] Sex: female Admission Date (Current Location): 09/16/2020  [facility] and [place] Number:  Engineering geologist and Address:  [facility], [address] [place], [place] [address]      Provider Number: [id number]  Attending Physician Name and Address:  Lorella Nimrod, MD  Relative Name and Phone Number:  Dewitt Rota, [phone]    Current Level of Care: Hospital Recommended Level of Care: [place] Prior Approval Number:    Date Approved/Denied:   PASRR Number: [id number]  Discharge Plan: Other (Comment) ([facility])    Current Diagnoses: Patient Active Problem List   Diagnosis Date Noted   GI bleed 09/16/2020   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[place]) 09/16/2020   Esophagitis    Depression 12/24/2017   CVA (cerebral vascular accident) ([place]) 08/09/2017   Chronic gastritis 06/09/2017   HTN (hypertension) 04/08/2017   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[place]) 04/08/2017   PVD (peripheral vascular disease) ([place]) 04/08/2017   Barrett's esophagus without dysplasia 04/08/2017   Senile purpura ([place]) 04/08/2017   UTI (urinary tract infection) 03/06/2016   Traumatic brain injury ([place]) 12/03/2014   Chronic constipation 08/23/2014   Major depression, recurrent, chronic ([place]) 08/23/2014   Diabetes mellitus, type 2 ([place]) 08/23/2014   OP (osteoporosis) 08/23/2014   HLD (hyperlipidemia) 08/23/2014   Cognitive impairment 08/23/2014    Orientation RESPIRATION BLADDER Height & Weight     Self    Incontinent Weight: 72.6 kg Height:  '5\' 3"'$  (160 cm)  BEHAVIORAL SYMPTOMS/MOOD NEUROLOGICAL BOWEL NUTRITION STATUS      Incontinent Diet (Tube feeding)  AMBULATORY STATUS COMMUNICATION OF NEEDS Skin   Total Care (parapelegic) Verbally Normal                       Personal Care Assistance Level of Assistance               Functional Limitations Info             SPECIAL CARE FACTORS FREQUENCY                       Contractures Contractures Info: Not present    Additional Factors Info  Code Status, Allergies Code Status Info: DNR Allergies Info: Dilantin (Phenytoin), Penicillins, Phenytoin Sodium Extended, Phenytoin Sodium Extended, Sulfa Antibiotics           Current Medications (09/17/2020):  This is the current hospital active medication list Current Facility-Administered Medications  Medication Dose Route Frequency Provider Last Rate Last Admin   0.9 %  sodium chloride infusion   Intravenous Continuous Agbata, Tochukwu, MD 100 mL/hr at 09/17/20 0432 New Bag at 09/17/20 0432   acetaminophen (TYLENOL) tablet 650 mg  650 mg Oral Q6H PRN Agbata, Tochukwu, MD       Or   acetaminophen (TYLENOL) suppository 650 mg  650 mg Rectal Q6H PRN Agbata, Tochukwu, MD       acidophilus (RISAQUAD) capsule 1 capsule  1 capsule Oral Daily Berta Minor, RPH   1 capsule at 09/17/20 1044   albuterol (PROVENTIL) (2.5 MG/3ML) 0.083% nebulizer solution 3 mL  3 mL Nebulization Q4H PRN Agbata, Tochukwu, MD       aspirin chewable tablet 81 mg  81 mg Per Tube Daily Agbata, Tochukwu, MD   81 mg at 09/17/20 1025   bisacodyl (DULCOLAX) suppository 10 mg  10 mg  Rectal PRN Agbata, Tochukwu, MD       calcium-vitamin D (OSCAL WITH D) 500-200 MG-UNIT per tablet 1 tablet  1 tablet Oral BID Agbata, Tochukwu, MD   1 tablet at 09/17/20 1044   cephALEXin (KEFLEX) 250 MG/5ML suspension 500 mg  500 mg Per Tube Q8H Lorella Nimrod, MD       cholecalciferol (VITAMIN D3) tablet 500 Units  500 Units Oral BID Renda Rolls, RPH   500 Units at 09/17/20 1044   cilostazol (PLETAL) tablet 50 mg  50 mg Oral BID Agbata, Tochukwu, MD   50 mg at 09/17/20 1044   cloNIDine (CATAPRES) tablet 0.1 mg  0.1 mg Oral Q6H PRN Agbata, Tochukwu, MD       desmopressin (DDAVP) tablet 200 mcg  200 mcg Oral QHS Agbata, Tochukwu, MD   200 mcg at 09/16/20  2333   donepezil (ARICEPT) tablet 10 mg  10 mg Oral QHS Agbata, Tochukwu, MD   10 mg at 09/16/20 2309   ferrous sulfate 220 (44 Fe) MG/5ML solution 330 mg  330 mg Oral Daily Agbata, Tochukwu, MD   330 mg at 09/17/20 1043   fesoterodine (TOVIAZ) tablet 4 mg  4 mg Oral Daily Agbata, Tochukwu, MD   4 mg at 09/17/20 1044   FLUoxetine (PROZAC) 20 MG/5ML solution 5 mg  5 mg Per Tube Daily Agbata, Tochukwu, MD   5 mg at 09/17/20 1042   insulin aspart (novoLOG) injection 0-15 Units  0-15 Units Subcutaneous Q4H Agbata, Tochukwu, MD   5 Units at 09/16/20 1710   ipratropium-albuterol (DUONEB) 0.5-2.5 (3) MG/3ML nebulizer solution 3 mL  3 mL Nebulization BID Agbata, Tochukwu, MD   3 mL at 09/17/20 0738   lurasidone (LATUDA) tablet 20 mg  20 mg Oral Q breakfast Agbata, Tochukwu, MD   20 mg at 09/17/20 1044   memantine (NAMENDA XR) 24 hr capsule 28 mg  28 mg Oral Daily Agbata, Tochukwu, MD   28 mg at 09/17/20 1044   metoprolol tartrate (LOPRESSOR) tablet 12.5 mg  12.5 mg Per Tube BID Agbata, Tochukwu, MD   12.5 mg at 09/16/20 2318   nortriptyline (PAMELOR) capsule 25 mg  25 mg Oral Daily Agbata, Tochukwu, MD   25 mg at 09/17/20 1044   ondansetron (ZOFRAN) tablet 4 mg  4 mg Oral Q6H PRN Agbata, Tochukwu, MD       Or   ondansetron (ZOFRAN) injection 4 mg  4 mg Intravenous Q6H PRN Agbata, Tochukwu, MD       oxybutynin (DITROPAN) tablet 5 mg  5 mg Oral TID Agbata, Tochukwu, MD   5 mg at 09/17/20 1045   pantoprazole (PROTONIX) injection 40 mg  40 mg Intravenous Q24H Agbata, Tochukwu, MD       polyvinyl alcohol (LIQUIFILM TEARS) 1.4 % ophthalmic solution 1 drop  1 drop Both Eyes PRN Agbata, Tochukwu, MD       ramipril (ALTACE) capsule 5 mg  5 mg Oral Daily Agbata, Tochukwu, MD       scopolamine (TRANSDERM-SCOP) 1 MG/3DAYS 1.5 mg  1 patch Transdermal Q72H Agbata, Tochukwu, MD   1.5 mg at 09/17/20 1026   simvastatin (ZOCOR) tablet 10 mg  10 mg Oral QHS Agbata, Tochukwu, MD   10 mg at 09/16/20 2311   sodium chloride  tablet 1 g  1 g Per Tube BID Agbata, Tochukwu, MD   1 g at 09/17/20 1027     Discharge Medications: Please see discharge summary for a list of discharge medications.  Relevant Imaging Results:  Relevant Lab Results:   Additional Information SS# [id number]  Su Hilt, RN

## 2020-09-17 NOTE — Plan of Care (Signed)

## 2020-09-17 NOTE — Discharge Summary (Addendum)
Physician Discharge Summary  [name] [name] [id number] DOB: 1960/12/02 DOA: 09/16/2020  PCP: Pcp, No  Admit date: 09/16/2020 Discharge date: 09/18/2020  Admitted From: SNF Disposition: SNF  Recommendations for Outpatient Follow-up:  Follow up with PCP in 1-2 weeks Follow-up with gastroenterology in 1 to 2 weeks Please obtain BMP/CBC in one week Please follow up on the following pending results: Final urine culture results. Please follow-up on urine culture results and adjust antibiotics as needed.  Home Health: No Equipment/Devices: None, bedbound/quadriplegic Discharge Condition: Stable CODE STATUS: DNR Diet recommendation: Heart Healthy / Carb Modified   Brief/Interim Summary: [name] [name] is a 60 y.o. female with medical history significant for borderline intellectual functioning, diabetes mellitus, hypertension, depression, traumatic brain injury, spastic quadriparesis, bedbound status who was brought into the ER by EMS for evaluation of an episode of coffee-ground emesis. She denies having any abdominal pain.  Emergency room physician aspirated the PEG tube and noted some dark-colored material that was guaiac positive.  On arrival she was hemodynamically stable, appears dehydrated with concentrated labs with leukocytosis of 7.3 and hemoglobin of 15 which normalized next day with IV fluid.  Hemoglobin remained stable.  Did not had any more coffee-ground emesis.  Denies any pain.  UA concerning for UTI with positive leukocytes and nitrates.  She initially received Rocephin and transition to Keflex-urine cultures growing E. coli, pending final culture susceptibility results.  No urinary symptoms but patient is quadriplegic.  CT abdomen and pelvis shows a large staghorn type calculus of the superior pole calyces of the right kidney, measuring at least 2.2 cm.  Additional small nonobstructive calculus of the inferior pole of the left kidney.  No hydronephrosis. Percutaneous  gastrostomy. Circumferential wall thickening of the included lower esophagus.  Correlate for infectious or inflammatory esophagitis.  Malignancy is not excluded.  GI was also consulted and they does not think that any EGD is needed at this time.  Patient has an history of Barrett's esophagus from prior endoscopies.  They recommend twice daily PPI for esophagitis and continue tube feed.  Patient can follow-up with her gastroenterologist as an outpatient for further recommendations.  Patient will continue with rest of her home medications and follow-up with her providers.  Discharge Diagnoses:  Principal Problem:   GI bleed Active Problems:   Diabetes mellitus, type 2 (HCC)   Cognitive impairment   UTI (urinary tract infection)   HTN (hypertension)   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HCC)   PVD (peripheral vascular disease) ([place])   Depression   Functional quadriplegia [facility])   Discharge Instructions  Discharge Instructions     Diet - low sodium heart healthy   Complete by: As directed    Increase activity slowly   Complete by: As directed    Increase activity slowly   Complete by: As directed       Allergies as of 09/18/2020       Reactions   Dilantin [phenytoin] Other (See Comments)   unknown   Penicillins Other (See Comments)   .Has patient had a PCN reaction causing immediate rash, facial/tongue/throat swelling, SOB or lightheadedness with hypotension: Unknown Has patient had a PCN reaction causing severe rash involving mucus membranes or skin necrosis: Unknown Has patient had a PCN reaction that required hospitalization: Unknown Has patient had a PCN reaction occurring within the last 10 years: Unknown If all of the above answers are "NO", then may proceed with Cephalosporin use.   Phenytoin Sodium Extended Other (See Comments)   Other reaction(s): Other (See  Comments)   Phenytoin Sodium Extended Other (See Comments)   Other reaction(s): Other (See Comments)   Sulfa Antibiotics  Rash, Other (See Comments)   unknown        Medication List     STOP taking these medications    brompheniramine-pseudoephedrine-DM 30-2-10 MG/5ML syrup   clotrimazole 1 % cream Commonly known as: LOTRIMIN   ketoconazole 2 % cream Commonly known as: NIZORAL   lurasidone 20 MG Tabs tablet Commonly known as: Latuda   metoprolol succinate 25 MG 24 hr tablet Commonly known as: TOPROL-XL   nystatin cream Commonly known as: MYCOSTATIN   Tea Tree 100 % Oil   tolterodine 4 MG 24 hr capsule Commonly known as: DETROL LA       TAKE these medications    Accu-Chek Aviva Plus test strip Generic drug: glucose blood CHECK BLOOD SUGAR TWICE A WEEK   Acidophilus Lactobacillus Caps Place 1 capsule into feeding tube daily.   albuterol 108 (90 Base) MCG/ACT inhaler Commonly known as: VENTOLIN HFA Inhale 2 puffs into the lungs every 4 (four) hours as needed for wheezing or shortness of breath.   alendronate 70 MG tablet Commonly known as: FOSAMAX Take 1 tablet (70 mg total) by mouth once a week.   aspirin 81 MG chewable tablet Place 81 mg into feeding tube daily.   B-D SINGLE USE SWABS REGULAR Pads USE AS DIRECTED (CHECK BLOOD SUGAR TWICE A WEEK)   bisacodyl 10 MG suppository Commonly known as: DULCOLAX Place 1 suppository (10 mg total) rectally as needed for moderate constipation.   calcium-vitamin D 500-200 MG-UNIT tablet Commonly known as: OSCAL WITH D Take 1 tablet by mouth 2 (two) times daily.   cephALEXin 250 MG/5ML suspension Commonly known as: KEFLEX Place 10 mLs (500 mg total) into feeding tube every 8 (eight) hours for 5 days.   cholecalciferol 10 MCG (400 UNIT) Tabs tablet Commonly known as: VITAMIN D3 TAKE ONE TABLET BY MOUTH 2 TIMES A DAY   cilostazol 50 MG tablet Commonly known as: PLETAL Take 1 tablet (50 mg total) by mouth 2 (two) times daily.   cloNIDine 0.1 MG tablet Commonly known as: CATAPRES Take 0.1 mg by mouth every 6 (six) hours as  needed (SBP >180 or DBP >100).   desmopressin 0.2 MG tablet Commonly known as: DDAVP Take 1 tablet (200 mcg total) by mouth at bedtime.   donepezil 10 MG disintegrating tablet Commonly known as: ARICEPT ODT Take 10 mg by mouth at bedtime. What changed: Another medication with the same name was removed. Continue taking this medication, and follow the directions you see here.   feeding supplement (OSMOLITE 1.5 CAL) Liqd Place 237 mLs into feeding tube 5 (five) times daily.   ferrous sulfate 220 (44 Fe) MG/5ML solution Take 330 mg by mouth daily. What changed: Another medication with the same name was removed. Continue taking this medication, and follow the directions you see here.   FLUoxetine 20 MG/5ML solution Commonly known as: PROZAC Place 5 mg into feeding tube daily. What changed: Another medication with the same name was removed. Continue taking this medication, and follow the directions you see here.   free water Soln Place 100 mLs into feeding tube every 4 (four) hours.   ipratropium-albuterol 0.5-2.5 (3) MG/3ML Soln Commonly known as: DUONEB Take 3 mLs by nebulization 2 (two) times daily.   magnesium oxide 400 MG tablet Commonly known as: MAG-OX Place 400 mg into feeding tube daily.   memantine 28 MG Cp24 24  hr capsule Commonly known as: Namenda XR TAKE ONE CAPSULE BY MOUTH ONCE A DAY. ** DO NOT CRUSH **   metFORMIN 500 MG tablet Commonly known as: GLUCOPHAGE Place 500 mg into feeding tube daily. What changed: Another medication with the same name was removed. Continue taking this medication, and follow the directions you see here.   metoprolol tartrate 25 MG tablet Commonly known as: LOPRESSOR Place 12.5 mg into feeding tube 2 (two) times daily.   omeprazole 20 MG capsule Commonly known as: PRILOSEC Take 2 capsules (40 mg total) by mouth 2 (two) times daily.   oxybutynin 5 MG tablet Commonly known as: DITROPAN Place 5 mg into feeding tube daily.    polyethylene glycol powder 17 GM/SCOOP powder Commonly known as: GLYCOLAX/MIRALAX Take 17 g by mouth daily. Prn constipation   Probiotic-10 Caps Take 1 capsule by mouth daily.   ramipril 10 MG capsule Commonly known as: ALTACE TAKE 1 CAPSULE BY MOUTH EVERY DAY What changed:  how much to take how to take this   scopolamine 1 MG/3DAYS Commonly known as: TRANSDERM-SCOP Place 1 patch onto the skin every 3 (three) days.   senna-docusate 8.6-50 MG tablet Commonly known as: Senokot-S Take 2 tablets by mouth 2 (two) times daily.   simvastatin 10 MG tablet Commonly known as: ZOCOR Take 1 tablet (10 mg total) by mouth at bedtime.   sodium chloride 1 g tablet Place 1 g into feeding tube 2 (two) times daily.        Contact information for after-discharge care     Destination     [facility] .   Service: Skilled Nursing Contact information: [address] [place] [phone]                    Allergies  Allergen Reactions   Dilantin [Phenytoin] Other (See Comments)    unknown   Penicillins Other (See Comments)    .Has patient had a PCN reaction causing immediate rash, facial/tongue/throat swelling, SOB or lightheadedness with hypotension: Unknown Has patient had a PCN reaction causing severe rash involving mucus membranes or skin necrosis: Unknown Has patient had a PCN reaction that required hospitalization: Unknown Has patient had a PCN reaction occurring within the last 10 years: Unknown If all of the above answers are "NO", then may proceed with Cephalosporin use.    Phenytoin Sodium Extended Other (See Comments)    Other reaction(s): Other (See Comments)   Phenytoin Sodium Extended Other (See Comments)    Other reaction(s): Other (See Comments)   Sulfa Antibiotics Rash and Other (See Comments)    unknown    Consultations: Gastroenterology.  Procedures/Studies: CT ABDOMEN PELVIS WO  CONTRAST  Result Date: 09/16/2020 CLINICAL DATA:  Abdominal distension, rule out obstruction EXAM: CT ABDOMEN AND PELVIS WITHOUT CONTRAST TECHNIQUE: Multidetector CT imaging of the abdomen and pelvis was performed following the standard protocol without IV contrast. COMPARISON:  06/09/2013 FINDINGS: Lower chest: No acute abnormality. Circumferential wall thickening of the lower esophagus (series 2, image 10). Hepatobiliary: No focal liver abnormality is seen. Status post cholecystectomy. No biliary dilatation. Pancreas: Unremarkable. No pancreatic ductal dilatation or surrounding inflammatory changes. Spleen: Normal in size without significant abnormality. Adrenals/Urinary Tract: Stable, definitively benign, fat containing bilateral adrenal adenomata. The large, staghorn type calculus of the superior pole calices of the right kidney, measuring at least 2.2 cm (series 5, image 65). Additional small nonobstructive calculus of the inferior pole of the left kidney. No  hydronephrosis. Bladder is unremarkable. Stomach/Bowel: Percutaneous gastrostomy. Appendix appears normal. No evidence of bowel wall thickening, distention, or inflammatory changes. Vascular/Lymphatic: Aortic atherosclerosis. No enlarged abdominal or pelvic lymph nodes. Reproductive: No mass or other significant abnormality. Other: No abdominal wall hernia or abnormality. No abdominopelvic ascites. Musculoskeletal: No acute or significant osseous findings. IMPRESSION: 1. No acute noncontrast CT findings of the abdomen or pelvis to explain abdominal distension. No evidence of bowel obstruction. 2. Large, staghorn type calculus of the superior pole calices of the right kidney, measuring at least 2.2 cm. Additional small nonobstructive calculus of the inferior pole of the left kidney. No hydronephrosis. 3. Percutaneous gastrostomy. 4. Circumferential wall thickening of the included lower esophagus. Correlate for infectious or inflammatory esophagitis.  Malignancy is not excluded. Aortic Atherosclerosis (ICD10-I70.0). Electronically Signed   By: Eddie Candle M.D.   On: 09/16/2020 13:12   DG Chest Portable 1 View  Result Date: 09/16/2020 CLINICAL DATA:  Pneumonia. EXAM: PORTABLE CHEST 1 VIEW COMPARISON:  08/14/2017 FINDINGS: 1421 hours. The lungs are clear without focal pneumonia, edema, pneumothorax or pleural effusion. The cardiopericardial silhouette is within normal limits for size. The visualized bony structures of the thorax show no acute abnormality. Telemetry leads overlie the chest. IMPRESSION: No active disease. Electronically Signed   By: Misty Stanley M.D.   On: 09/16/2020 14:50    Subjective: Patient was seen and examined today.,  Alert and oriented x3.  She wants to go back to her facility.  Denies any more nausea or vomiting.  No urinary symptoms or patient is quadriplegic.  No abdominal pain.  Discharge Exam: Vitals:   09/18/20 0402 09/18/20 0820  BP: 108/70 107/65  Pulse: 67 92  Resp: 18 18  Temp: (!) 97.5 F (36.4 C) 97.6 F (36.4 C)  SpO2: 98% 96%   Vitals:   09/17/20 2039 09/17/20 2318 09/18/20 0402 09/18/20 0820  BP:  102/69 108/70 107/65  Pulse:  83 67 92  Resp:  '19 18 18  '$ Temp:  98.1 F (36.7 C) (!) 97.5 F (36.4 C) 97.6 F (36.4 C)  TempSrc:    Oral  SpO2: 95% 94% 98% 96%  Weight:      Height:        General: Pt is alert, awake, not in acute distress, mild dysarthria Cardiovascular: RRR, S1/S2 +, no rubs, no gallops Respiratory: CTA bilaterally, no wheezing, no rhonchi Abdominal: Soft, NT, ND, bowel sounds + Extremities: no edema, no cyanosis   The results of significant diagnostics from this hospitalization (including imaging, microbiology, ancillary and laboratory) are listed below for reference.    Microbiology: Recent Results (from the past 240 hour(s))  Urine Culture     Status: Abnormal (Preliminary result)   Collection Time: 09/16/20 11:56 AM   Specimen: Urine, Clean Catch  Result Value  Ref Range Status   Specimen Description   Final    URINE, CLEAN CATCH Performed at [facility], [address] [place], [place] [address]    Special Requests   Final    NONE Performed at [facility], [address] [place], [place] [address]    Culture (A)  Final    >=100,000 COLONIES/mL ESCHERICHIA COLI SUSCEPTIBILITIES TO FOLLOW Performed at [facility], [place] [address] [place], [place] [address]    Report Status PENDING  Incomplete  Resp Panel by RT-PCR (Flu A&B, Covid) Nasopharyngeal Swab     Status: None   Collection Time: 09/16/20  2:33 PM   Specimen: Nasopharyngeal Swab;  Nasopharyngeal(NP) swabs in vial transport medium  Result Value Ref Range Status   SARS Coronavirus 2 by RT PCR NEGATIVE NEGATIVE Final    Comment: (NOTE) SARS-CoV-2 target nucleic acids are NOT DETECTED.  The SARS-CoV-2 RNA is generally detectable in upper respiratory specimens during the acute phase of infection. The lowest concentration of SARS-CoV-2 viral copies this assay can detect is 138 copies/mL. A negative result does not preclude SARS-Cov-2 infection and should not be used as the sole basis for treatment or other patient management decisions. A negative result may occur with  improper specimen collection/handling, submission of specimen other than nasopharyngeal swab, presence of viral mutation(s) within the areas targeted by this assay, and inadequate number of viral copies(<138 copies/mL). A negative result must be combined with clinical observations, patient history, and epidemiological information. The expected result is Negative.  Fact Sheet for Patients:  EntrepreneurPulse.com.au  Fact Sheet for Healthcare Providers:  IncredibleEmployment.be  This test is no t yet approved or cleared by the [place] FDA and  has been authorized for detection and/or diagnosis of SARS-CoV-2 by FDA under an Emergency Use  Authorization (EUA). This EUA will remain  in effect (meaning this test can be used) for the duration of the COVID-19 declaration under Section 564(b)(1) of the Act, 21 U.S.C.section 360bbb-3(b)(1), unless the authorization is terminated  or revoked sooner.       Influenza A by PCR NEGATIVE NEGATIVE Final   Influenza B by PCR NEGATIVE NEGATIVE Final    Comment: (NOTE) The Xpert Xpress SARS-CoV-2/FLU/RSV plus assay is intended as an aid in the diagnosis of influenza from Nasopharyngeal swab specimens and should not be used as a sole basis for treatment. Nasal washings and aspirates are unacceptable for Xpert Xpress SARS-CoV-2/FLU/RSV testing.  Fact Sheet for Patients: EntrepreneurPulse.com.au  Fact Sheet for Healthcare Providers: IncredibleEmployment.be  This test is not yet approved or cleared by the [place] FDA and has been authorized for detection and/or diagnosis of SARS-CoV-2 by FDA under an Emergency Use Authorization (EUA). This EUA will remain in effect (meaning this test can be used) for the duration of the COVID-19 declaration under Section 564(b)(1) of the Act, 21 U.S.C. section 360bbb-3(b)(1), unless the authorization is terminated or revoked.  Performed at [facility], [address] [place], [place] [address]      Labs: BNP (last 3 results) No results for input(s): BNP in the last 8760 hours. Basic Metabolic Panel: Recent Labs  Lab 09/16/20 1102 09/17/20 0347  NA 134* 140  K 5.0 4.2  CL 99 106  CO2 27 26  GLUCOSE 272* 97  BUN 22 16  CREATININE 0.62 0.61  CALCIUM 9.7 8.8*   Liver Function Tests: Recent Labs  Lab 09/16/20 1102  AST 18  ALT 12  ALKPHOS 102  BILITOT 0.5  PROT 7.1  ALBUMIN 3.6   Recent Labs  Lab 09/16/20 1102  LIPASE 27   No results for input(s): AMMONIA in the last 168 hours. CBC: Recent Labs  Lab 09/16/20 1102 09/17/20 0347  WBC 17.3* 9.1  HGB 15.0 12.6  HCT  44.5 38.7  MCV 90.4 91.7  PLT 283 235   Cardiac Enzymes: No results for input(s): CKTOTAL, CKMB, CKMBINDEX, TROPONINI in the last 168 hours. BNP: Invalid input(s): POCBNP CBG: Recent Labs  Lab 09/17/20 1942 09/18/20 0145 09/18/20 0214 09/18/20 0404 09/18/20 0821  GLUCAP 204* 38* 92 76 214*   D-Dimer No results for input(s): DDIMER in the last 72 hours. Hgb A1c Recent Labs  09/16/20 1650  HGBA1C 6.7*   Lipid Profile No results for input(s): CHOL, HDL, LDLCALC, TRIG, CHOLHDL, LDLDIRECT in the last 72 hours. Thyroid function studies No results for input(s): TSH, T4TOTAL, T3FREE, THYROIDAB in the last 72 hours.  Invalid input(s): FREET3 Anemia work up No results for input(s): VITAMINB12, FOLATE, FERRITIN, TIBC, IRON, RETICCTPCT in the last 72 hours. Urinalysis    Component Value Date/Time   COLORURINE YELLOW 09/16/2020 1156   APPEARANCEUR HAZY (A) 09/16/2020 1156   APPEARANCEUR Clear 11/09/2012 1132   LABSPEC 1.015 09/16/2020 1156   LABSPEC 1.018 11/09/2012 1132   PHURINE 7.0 09/16/2020 1156   GLUCOSEU >1,000 (A) 09/16/2020 1156   GLUCOSEU >=500 11/09/2012 1132   HGBUR TRACE (A) 09/16/2020 1156   BILIRUBINUR NEGATIVE 09/16/2020 1156   BILIRUBINUR negative 03/06/2016 1547   BILIRUBINUR Negative 11/09/2012 1132   KETONESUR NEGATIVE 09/16/2020 1156   PROTEINUR NEGATIVE 09/16/2020 1156   UROBILINOGEN 0.2 03/06/2016 1547   NITRITE POSITIVE (A) 09/16/2020 1156   LEUKOCYTESUR MODERATE (A) 09/16/2020 1156   LEUKOCYTESUR Negative 11/09/2012 1132   Sepsis Labs Invalid input(s): PROCALCITONIN,  WBC,  LACTICIDVEN Microbiology Recent Results (from the past 240 hour(s))  Urine Culture     Status: Abnormal (Preliminary result)   Collection Time: 09/16/20 11:56 AM   Specimen: Urine, Clean Catch  Result Value Ref Range Status   Specimen Description   Final    URINE, CLEAN CATCH Performed at [facility], [address] [place], [place] [address]    Special  Requests   Final    NONE Performed at [facility], [address], [place], [place] [address]    Culture (A)  Final    >=100,000 COLONIES/mL ESCHERICHIA COLI SUSCEPTIBILITIES TO FOLLOW Performed at [facility], [place] [address] [place], [place] [address]    Report Status PENDING  Incomplete  Resp Panel by RT-PCR (Flu A&B, Covid) Nasopharyngeal Swab     Status: None   Collection Time: 09/16/20  2:33 PM   Specimen: Nasopharyngeal Swab; Nasopharyngeal(NP) swabs in vial transport medium  Result Value Ref Range Status   SARS Coronavirus 2 by RT PCR NEGATIVE NEGATIVE Final    Comment: (NOTE) SARS-CoV-2 target nucleic acids are NOT DETECTED.  The SARS-CoV-2 RNA is generally detectable in upper respiratory specimens during the acute phase of infection. The lowest concentration of SARS-CoV-2 viral copies this assay can detect is 138 copies/mL. A negative result does not preclude SARS-Cov-2 infection and should not be used as the sole basis for treatment or other patient management decisions. A negative result may occur with  improper specimen collection/handling, submission of specimen other than nasopharyngeal swab, presence of viral mutation(s) within the areas targeted by this assay, and inadequate number of viral copies(<138 copies/mL). A negative result must be combined with clinical observations, patient history, and epidemiological information. The expected result is Negative.  Fact Sheet for Patients:  EntrepreneurPulse.com.au  Fact Sheet for Healthcare Providers:  IncredibleEmployment.be  This test is no t yet approved or cleared by the [place] FDA and  has been authorized for detection and/or diagnosis of SARS-CoV-2 by FDA under an Emergency Use Authorization (EUA). This EUA will remain  in effect (meaning this test can be used) for the duration of the COVID-19 declaration under Section 564(b)(1) of the Act,  21 U.S.C.section 360bbb-3(b)(1), unless the authorization is terminated  or revoked sooner.       Influenza A by PCR NEGATIVE NEGATIVE Final   Influenza B by PCR NEGATIVE  NEGATIVE Final    Comment: (NOTE) The Xpert Xpress SARS-CoV-2/FLU/RSV plus assay is intended as an aid in the diagnosis of influenza from Nasopharyngeal swab specimens and should not be used as a sole basis for treatment. Nasal washings and aspirates are unacceptable for Xpert Xpress SARS-CoV-2/FLU/RSV testing.  Fact Sheet for Patients: EntrepreneurPulse.com.au  Fact Sheet for Healthcare Providers: IncredibleEmployment.be  This test is not yet approved or cleared by the [place] FDA and has been authorized for detection and/or diagnosis of SARS-CoV-2 by FDA under an Emergency Use Authorization (EUA). This EUA will remain in effect (meaning this test can be used) for the duration of the COVID-19 declaration under Section 564(b)(1) of the Act, 21 U.S.C. section 360bbb-3(b)(1), unless the authorization is terminated or revoked.  Performed at [facility], [address] [place], [place] [address]     Time coordinating discharge: Over 30 minutes  SIGNED:  Lorella Nimrod, MD  [facility] 09/18/2020, 11:03 AM  If 7PM-7AM, please contact night-coverage [email or web address]  This record has been created using Systems analyst. Errors have been sought and corrected,but may not always be located. Such creation errors do not reflect on the standard of care.

## 2020-09-17 NOTE — Care Management CC44 (Signed)
Condition Code 44 Documentation Completed  Patient Details  Name: [name] [name] MRN: [id number] Date of Birth: [birth year]   Condition Code 44 given:  Yes Patient signature on Condition Code 44 notice:  Yes Documentation of 2 MD's agreement:  Yes Code 44 added to claim:  Yes    Su Hilt, RN 09/17/2020, 1:42 PM

## 2020-09-17 NOTE — TOC Initial Note (Signed)
Transition of Care [facility]) - Initial/Assessment Note    Patient Details  Name: [name] [name] MRN: [id number] Date of Birth: [birth year]  [facility]) CM/SW Contact:    Su Hilt, RN Phone Number: 09/17/2020, 11:12 AM  Clinical Narrative:                 Patient came form [facility], Sent message to Hilda Blades at [facility] to let me know if the patient can return today, awaitng a responce        Patient Goals and CMS Choice        Expected Discharge Plan and Services                                                Prior Living Arrangements/Services                       Activities of Daily Living      Permission Sought/Granted                  Emotional Assessment              Admission diagnosis:  GI bleed [K92.2] Urinary tract infection without hematuria, site unspecified [N39.0] Hematemesis without nausea [K92.0] Patient Active Problem List   Diagnosis Date Noted   GI bleed 09/16/2020   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   Esophagitis    Depression 12/24/2017   CVA (cerebral vascular accident) ([place]) 08/09/2017   Chronic gastritis 06/09/2017   HTN (hypertension) 04/08/2017   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   PVD (peripheral vascular disease) ([place]) 04/08/2017   Barrett's esophagus without dysplasia 04/08/2017   Senile purpura ([place]) 04/08/2017   UTI (urinary tract infection) 03/06/2016   Traumatic brain injury ([place]) 12/03/2014   Chronic constipation 08/23/2014   Major depression, recurrent, chronic ([place]) 08/23/2014   Diabetes mellitus, type 2 ([place]) 08/23/2014   OP (osteoporosis) 08/23/2014   HLD (hyperlipidemia) 08/23/2014   Cognitive impairment 08/23/2014   PCP:  Merryl Hacker No Pharmacy:   [place], [place] - [address] [place] [address] Phone: [phone] Fax: [phone]  [facility] [place] [place] [address] Phone: [phone] Fax: [phone]     Social Determinants of Health (SDOH) Interventions    Readmission Risk Interventions No flowsheet data found.

## 2020-09-17 NOTE — Progress Notes (Signed)
   09/17/20 1727  Assess: MEWS Score  Temp 97.6 F (36.4 C)  BP 113/63  Pulse Rate (!) 143  Resp 18  Level of Consciousness Alert  SpO2 100 %  O2 Device Room Air  Assess: MEWS Score  MEWS Temp 0  MEWS Systolic 0  MEWS Pulse 3  MEWS RR 0  MEWS LOC 0  MEWS Score 3  MEWS Score Color Yellow  Assess: if the MEWS score is Yellow or Red  Were vital signs taken at a resting state? Yes  Focused Assessment Change from prior assessment (see assessment flowsheet)  Does the patient meet 2 or more of the SIRS criteria? No  MEWS guidelines implemented *See Row Information* Yes  Treat  MEWS Interventions Other (Comment) (MD made aware)  Pain Scale 0-10  Pain Score 0  Take Vital Signs  Increase Vital Sign Frequency  Yellow: Q 2hr X 2 then Q 4hr X 2, if remains yellow, continue Q 4hrs  Escalate  MEWS: Escalate Yellow: discuss with charge nurse/RN and consider discussing with provider and RRT  Notify: Charge Nurse/RN  Name of Charge Nurse/RN Notified Helene Kelp RN  Date Charge Nurse/RN Notified 09/17/20  Time Charge Nurse/RN Notified 1730  Notify: Provider  Provider Name/Title Dr. Reesa Chew  Date Provider Notified 09/17/20  Time Provider Notified [id number]  Notification Type Page  Notification Reason Change in status (yellow MEWS d/t increased HR)  Assess: SIRS CRITERIA  SIRS Temperature  0  SIRS Pulse 1  SIRS Respirations  0  SIRS WBC 0  SIRS Score Sum  1

## 2020-09-18 DIAGNOSIS — N3000 Acute cystitis without hematuria: Secondary | ICD-10-CM

## 2020-09-18 DIAGNOSIS — R4189 Other symptoms and signs involving cognitive functions and awareness: Secondary | ICD-10-CM | POA: Diagnosis not present

## 2020-09-18 DIAGNOSIS — K922 Gastrointestinal hemorrhage, unspecified: Secondary | ICD-10-CM | POA: Diagnosis not present

## 2020-09-18 LAB — GLUCOSE, CAPILLARY
Glucose-Capillary: 38 mg/dL — CL (ref 70–99)
Glucose-Capillary: 92 mg/dL (ref 70–99)
Glucose-Capillary: 76 mg/dL (ref 70–99)
Glucose-Capillary: 214 mg/dL — ABNORMAL HIGH (ref 70–99)
Glucose-Capillary: 181 mg/dL — ABNORMAL HIGH (ref 70–99)

## 2020-09-18 MED ORDER — OSMOLITE 1.5 CAL PO LIQD: 237.0000 mL | Freq: Every day | ORAL | 0 refills | Status: DC

## 2020-09-18 MED ORDER — FREE WATER: 100.0000 mL | Status: DC

## 2020-09-18 NOTE — TOC Progression Note (Signed)
Transition of Care [facility]) - Progression Note    Patient Details  Name: [name] [name] MRN: [id number] Date of Birth: 11-Jan-1961  Transition [facility]) CM/SW Contact  Su Hilt, RN Phone Number: 09/18/2020, 12:28 PM  Clinical Narrative:     Bedside nurse called report, DC summary sent thru the hub, The patient will be transported by [facility] they have been called Guardian aware        Expected Discharge Plan and Services           Expected Discharge Date: 09/18/20                                     Social Determinants of Health (SDOH) Interventions    Readmission Risk Interventions No flowsheet data found.

## 2020-09-18 NOTE — Progress Notes (Signed)
Hypoglycemic Event  CBG: 38  Treatment: 8 oz juice/soda  Symptoms: None  Follow-up CBG: Time:0215 CBG Result:92  Possible Reasons for Event: Unknown   Comments/MD notified: Yes via secure chat  Pt's BG was down to 38 at 0145; because of IV infiltate, 8 ounces of OJ administered via G-tube; BG up to 92 at 0215.  She remained asymptomatic during the entire time.   [name] [name]

## 2020-09-18 NOTE — Progress Notes (Signed)
Patient was discharged yesterday, unable to go as she became little tachycardic before leaving. Apparently home dose of metoprolol was held for borderline blood pressure. Tachycardia improved after resuming home dose of metoprolol.  Patient was seen and examined today.  No new complaints.  She wants to go home. Vitals seems stable.  Urine cultures growing E. coli-pending susceptibility results.  Patient is stable to go back to her facility on [facility].

## 2020-09-19 LAB — URINE CULTURE: Culture: >=100000 — AB

## 2020-12-06 ENCOUNTER — Other Ambulatory Visit
Admission: RE | Admit: 2020-12-06 | Discharge: 2020-12-06 | Disposition: A | Discharge status: Home / Self Care | Payer: Medicare Other | Source: Ambulatory Visit | Attending: Family Medicine | Admitting: Family Medicine

## 2020-12-06 DIAGNOSIS — E119 Type 2 diabetes mellitus without complications: Secondary | ICD-10-CM | POA: Insufficient documentation

## 2020-12-06 DIAGNOSIS — A419 Sepsis, unspecified organism: Secondary | ICD-10-CM | POA: Diagnosis not present

## 2020-12-06 LAB — CBC WITH DIFFERENTIAL/PLATELET
Abs Immature Granulocytes: 0.04 10*3/uL (ref 0.00–0.07)
Basophils Absolute: 0.1 10*3/uL (ref 0.0–0.1)
Basophils Relative: 1 %
Eosinophils Absolute: 0.2 10*3/uL (ref 0.0–0.5)
Eosinophils Relative: 2 %
HCT: 30.6 % — ABNORMAL LOW (ref 36.0–46.0)
Hemoglobin: 9.8 g/dL — ABNORMAL LOW (ref 12.0–15.0)
Immature Granulocytes: 0 %
Lymphocytes Relative: 27 %
Lymphs Abs: 3.1 10*3/uL (ref 0.7–4.0)
MCH: 30.2 pg (ref 26.0–34.0)
MCHC: 32 g/dL (ref 30.0–36.0)
MCV: 94.2 fL (ref 80.0–100.0)
Monocytes Absolute: 1.1 10*3/uL — ABNORMAL HIGH (ref 0.1–1.0)
Monocytes Relative: 10 %
Neutro Abs: 7 10*3/uL (ref 1.7–7.7)
Neutrophils Relative %: 60 %
Platelets: 281 10*3/uL (ref 150–400)
RBC: 3.25 MIL/uL — ABNORMAL LOW (ref 3.87–5.11)
RDW: 13.9 % (ref 11.5–15.5)
WBC: 11.6 10*3/uL — ABNORMAL HIGH (ref 4.0–10.5)
nRBC: 0 % (ref 0.0–0.2)

## 2020-12-06 LAB — BASIC METABOLIC PANEL
Anion gap: 8 (ref 5–15)
BUN: 41 mg/dL — ABNORMAL HIGH (ref 8–23)
CO2: 29 mmol/L (ref 22–32)
Calcium: 8.6 mg/dL — ABNORMAL LOW (ref 8.9–10.3)
Chloride: 108 mmol/L (ref 98–111)
Creatinine, Ser: 0.87 mg/dL (ref 0.44–1.00)
GFR, Estimated: >60 mL/min (ref >60)
Glucose, Bld: 282 mg/dL — ABNORMAL HIGH (ref 70–99)
Potassium: 4.4 mmol/L (ref 3.5–5.1)
Sodium: 145 mmol/L (ref 135–145)

## 2020-12-09 ENCOUNTER — Other Ambulatory Visit
Admission: RE | Admit: 2020-12-09 | Discharge: 2020-12-09 | Disposition: A | Discharge status: Home / Self Care | Payer: Medicare Other | Source: Ambulatory Visit | Attending: Family Medicine | Admitting: Family Medicine

## 2020-12-09 ENCOUNTER — Encounter: Payer: Self-pay | Admitting: Emergency Medicine

## 2020-12-09 ENCOUNTER — Inpatient Hospital Stay
Admission: EM | Admit: 2020-12-09 | Discharge: 2020-12-16 | DRG: 871 | Disposition: A | Discharge status: Skilled Nursing Facility | Payer: Medicare Other | Source: Skilled Nursing Facility | Attending: Internal Medicine | Admitting: Internal Medicine

## 2020-12-09 ENCOUNTER — Emergency Department: Payer: Medicare Other

## 2020-12-09 ENCOUNTER — Other Ambulatory Visit: Payer: Self-pay

## 2020-12-09 DIAGNOSIS — N200 Calculus of kidney: Secondary | ICD-10-CM

## 2020-12-09 DIAGNOSIS — R6520 Severe sepsis without septic shock: Secondary | ICD-10-CM | POA: Diagnosis present

## 2020-12-09 DIAGNOSIS — K2270 Barrett's esophagus without dysplasia: Secondary | ICD-10-CM | POA: Diagnosis present

## 2020-12-09 DIAGNOSIS — Z885 Allergy status to narcotic agent status: Secondary | ICD-10-CM | POA: Diagnosis not present

## 2020-12-09 DIAGNOSIS — Z20822 Contact with and (suspected) exposure to covid-19: Secondary | ICD-10-CM | POA: Diagnosis present

## 2020-12-09 DIAGNOSIS — N390 Urinary tract infection, site not specified: Secondary | ICD-10-CM | POA: Diagnosis present

## 2020-12-09 DIAGNOSIS — Z833 Family history of diabetes mellitus: Secondary | ICD-10-CM

## 2020-12-09 DIAGNOSIS — K209 Esophagitis, unspecified without bleeding: Secondary | ICD-10-CM | POA: Diagnosis present

## 2020-12-09 DIAGNOSIS — D509 Iron deficiency anemia, unspecified: Secondary | ICD-10-CM | POA: Diagnosis present

## 2020-12-09 DIAGNOSIS — G8250 Quadriplegia, unspecified: Secondary | ICD-10-CM | POA: Diagnosis present

## 2020-12-09 DIAGNOSIS — Z87891 Personal history of nicotine dependence: Secondary | ICD-10-CM

## 2020-12-09 DIAGNOSIS — F79 Unspecified intellectual disabilities: Secondary | ICD-10-CM | POA: Diagnosis present

## 2020-12-09 DIAGNOSIS — B957 Other staphylococcus as the cause of diseases classified elsewhere: Secondary | ICD-10-CM | POA: Diagnosis present

## 2020-12-09 DIAGNOSIS — N3000 Acute cystitis without hematuria: Secondary | ICD-10-CM | POA: Diagnosis not present

## 2020-12-09 DIAGNOSIS — A419 Sepsis, unspecified organism: Principal | ICD-10-CM | POA: Diagnosis present

## 2020-12-09 DIAGNOSIS — I10 Essential (primary) hypertension: Secondary | ICD-10-CM | POA: Diagnosis present

## 2020-12-09 DIAGNOSIS — R4701 Aphasia: Secondary | ICD-10-CM | POA: Diagnosis present

## 2020-12-09 DIAGNOSIS — G9341 Metabolic encephalopathy: Secondary | ICD-10-CM | POA: Diagnosis present

## 2020-12-09 DIAGNOSIS — E785 Hyperlipidemia, unspecified: Secondary | ICD-10-CM | POA: Diagnosis present

## 2020-12-09 DIAGNOSIS — Z880 Allergy status to penicillin: Secondary | ICD-10-CM | POA: Diagnosis not present

## 2020-12-09 DIAGNOSIS — Z87820 Personal history of traumatic brain injury: Secondary | ICD-10-CM | POA: Diagnosis not present

## 2020-12-09 DIAGNOSIS — R532 Functional quadriplegia: Secondary | ICD-10-CM | POA: Diagnosis present

## 2020-12-09 DIAGNOSIS — R4189 Other symptoms and signs involving cognitive functions and awareness: Secondary | ICD-10-CM | POA: Diagnosis present

## 2020-12-09 DIAGNOSIS — R300 Dysuria: Secondary | ICD-10-CM | POA: Insufficient documentation

## 2020-12-09 DIAGNOSIS — Z79899 Other long term (current) drug therapy: Secondary | ICD-10-CM

## 2020-12-09 DIAGNOSIS — K5909 Other constipation: Secondary | ICD-10-CM | POA: Diagnosis present

## 2020-12-09 DIAGNOSIS — F0393 Unspecified dementia, unspecified severity, with mood disturbance: Secondary | ICD-10-CM | POA: Diagnosis present

## 2020-12-09 DIAGNOSIS — Z66 Do not resuscitate: Secondary | ICD-10-CM | POA: Diagnosis present

## 2020-12-09 DIAGNOSIS — Z931 Gastrostomy status: Secondary | ICD-10-CM

## 2020-12-09 DIAGNOSIS — Z7984 Long term (current) use of oral hypoglycemic drugs: Secondary | ICD-10-CM

## 2020-12-09 DIAGNOSIS — R0902 Hypoxemia: Secondary | ICD-10-CM | POA: Diagnosis not present

## 2020-12-09 DIAGNOSIS — K2100 Gastro-esophageal reflux disease with esophagitis, without bleeding: Secondary | ICD-10-CM | POA: Diagnosis present

## 2020-12-09 DIAGNOSIS — Z7982 Long term (current) use of aspirin: Secondary | ICD-10-CM

## 2020-12-09 DIAGNOSIS — E1165 Type 2 diabetes mellitus with hyperglycemia: Secondary | ICD-10-CM | POA: Diagnosis present

## 2020-12-09 DIAGNOSIS — Z882 Allergy status to sulfonamides status: Secondary | ICD-10-CM

## 2020-12-09 LAB — COMPREHENSIVE METABOLIC PANEL
ALT: 14 U/L (ref 0–44)
AST: 18 U/L (ref 15–41)
Albumin: 3.2 g/dL — ABNORMAL LOW (ref 3.5–5.0)
Alkaline Phosphatase: 93 U/L (ref 38–126)
Anion gap: 10 (ref 5–15)
BUN: 33 mg/dL — ABNORMAL HIGH (ref 8–23)
CO2: 27 mmol/L (ref 22–32)
Calcium: 9.4 mg/dL (ref 8.9–10.3)
Chloride: 99 mmol/L (ref 98–111)
Creatinine, Ser: 0.56 mg/dL (ref 0.44–1.00)
GFR, Estimated: >60 mL/min (ref >60)
Glucose, Bld: 297 mg/dL — ABNORMAL HIGH (ref 70–99)
Potassium: 4.8 mmol/L (ref 3.5–5.1)
Sodium: 136 mmol/L (ref 135–145)
Total Bilirubin: 0.5 mg/dL (ref 0.3–1.2)
Total Protein: 7.2 g/dL (ref 6.5–8.1)

## 2020-12-09 LAB — URINALYSIS, ROUTINE W REFLEX MICROSCOPIC
Bilirubin Urine: NEGATIVE
Glucose, UA: >=500 mg/dL — AB
Hgb urine dipstick: NEGATIVE
Ketones, ur: 5 mg/dL — AB
Nitrite: NEGATIVE
Protein, ur: NEGATIVE mg/dL
Specific Gravity, Urine: 1.017 (ref 1.005–1.030)
pH: 7 (ref 5.0–8.0)

## 2020-12-09 LAB — CBC WITH DIFFERENTIAL/PLATELET
Abs Immature Granulocytes: 0.27 10*3/uL — ABNORMAL HIGH (ref 0.00–0.07)
Basophils Absolute: 0.1 10*3/uL (ref 0.0–0.1)
Basophils Relative: 0 %
Eosinophils Absolute: 0 10*3/uL (ref 0.0–0.5)
Eosinophils Relative: 0 %
HCT: 30.9 % — ABNORMAL LOW (ref 36.0–46.0)
Hemoglobin: 10.1 g/dL — ABNORMAL LOW (ref 12.0–15.0)
Immature Granulocytes: 1 %
Lymphocytes Relative: 7 %
Lymphs Abs: 2.3 10*3/uL (ref 0.7–4.0)
MCH: 30 pg (ref 26.0–34.0)
MCHC: 32.7 g/dL (ref 30.0–36.0)
MCV: 91.7 fL (ref 80.0–100.0)
Monocytes Absolute: 1.5 10*3/uL — ABNORMAL HIGH (ref 0.1–1.0)
Monocytes Relative: 5 %
Neutro Abs: 28.1 10*3/uL — ABNORMAL HIGH (ref 1.7–7.7)
Neutrophils Relative %: 87 %
Platelets: 386 10*3/uL (ref 150–400)
RBC: 3.37 MIL/uL — ABNORMAL LOW (ref 3.87–5.11)
RDW: 13.4 % (ref 11.5–15.5)
Smear Review: NORMAL
WBC: 32.2 10*3/uL — ABNORMAL HIGH (ref 4.0–10.5)
nRBC: 0 % (ref 0.0–0.2)

## 2020-12-09 LAB — RESP PANEL BY RT-PCR (FLU A&B, COVID) ARPGX2
Influenza A by PCR: NEGATIVE
Influenza B by PCR: NEGATIVE
SARS Coronavirus 2 by RT PCR: NEGATIVE

## 2020-12-09 LAB — CBC
HCT: 26.7 % — ABNORMAL LOW (ref 36.0–46.0)
Hemoglobin: 8.3 g/dL — ABNORMAL LOW (ref 12.0–15.0)
MCH: 29.1 pg (ref 26.0–34.0)
MCHC: 31.1 g/dL (ref 30.0–36.0)
MCV: 93.7 fL (ref 80.0–100.0)
Platelets: 264 10*3/uL (ref 150–400)
RBC: 2.85 MIL/uL — ABNORMAL LOW (ref 3.87–5.11)
RDW: 13.5 % (ref 11.5–15.5)
WBC: 21.6 10*3/uL — ABNORMAL HIGH (ref 4.0–10.5)
nRBC: 0 % (ref 0.0–0.2)

## 2020-12-09 LAB — URINALYSIS, COMPLETE (UACMP) WITH MICROSCOPIC
Bilirubin Urine: NEGATIVE
Glucose, UA: >=500 mg/dL — AB
Ketones, ur: NEGATIVE mg/dL
Nitrite: NEGATIVE
Protein, ur: NEGATIVE mg/dL
Specific Gravity, Urine: 1.023 (ref 1.005–1.030)
pH: 6 (ref 5.0–8.0)

## 2020-12-09 LAB — PROTIME-INR
INR: 0.9 (ref 0.8–1.2)
Prothrombin Time: 12.3 seconds (ref 11.4–15.2)

## 2020-12-09 LAB — APTT: aPTT: 25 seconds (ref 24–36)

## 2020-12-09 LAB — CREATININE, SERUM
Creatinine, Ser: 0.53 mg/dL (ref 0.44–1.00)
GFR, Estimated: >60 mL/min (ref >60)

## 2020-12-09 LAB — LACTIC ACID, PLASMA
Lactic Acid, Venous: 2.2 mmol/L (ref 0.5–1.9)
Lactic Acid, Venous: 3.2 mmol/L (ref 0.5–1.9)

## 2020-12-09 MED ORDER — ONDANSETRON HCL 4 MG/2ML IJ SOLN: 4.0000 mg | Freq: Four times a day (QID) | INTRAMUSCULAR | Status: DC | PRN

## 2020-12-09 MED ORDER — SODIUM CHLORIDE 0.9 % IV SOLN
2.0000 g | Freq: Once | INTRAVENOUS | Status: AC
  Administered 2020-12-09: 2 g via INTRAVENOUS
  Filled 2020-12-09: qty 2

## 2020-12-09 MED ORDER — IOHEXOL 300 MG/ML  SOLN
100.0000 mL | Freq: Once | INTRAMUSCULAR | Status: AC | PRN
  Administered 2020-12-09: 100 mL via INTRAVENOUS

## 2020-12-09 MED ORDER — ACETAMINOPHEN 325 MG PO TABS: 650.0000 mg | ORAL_TABLET | Freq: Four times a day (QID) | ORAL | Status: DC | PRN

## 2020-12-09 MED ORDER — SODIUM CHLORIDE 0.9 % IV SOLN: 2.0000 g | Freq: Three times a day (TID) | INTRAVENOUS | Status: DC

## 2020-12-09 MED ORDER — ONDANSETRON HCL 4 MG PO TABS: 4.0000 mg | ORAL_TABLET | Freq: Four times a day (QID) | ORAL | Status: DC | PRN

## 2020-12-09 MED ORDER — LACTATED RINGERS IV BOLUS (SEPSIS)
1000.0000 mL | Freq: Once | INTRAVENOUS | Status: AC
  Administered 2020-12-09: 1000 mL via INTRAVENOUS

## 2020-12-09 MED ORDER — ACETAMINOPHEN 650 MG RE SUPP
650.0000 mg | Freq: Four times a day (QID) | RECTAL | Status: DC | PRN
  Filled 2020-12-09: qty 1

## 2020-12-09 MED ORDER — SODIUM CHLORIDE 0.9 % IV BOLUS
1000.0000 mL | Freq: Once | INTRAVENOUS | Status: AC
  Administered 2020-12-09: 1000 mL via INTRAVENOUS

## 2020-12-09 MED ORDER — METRONIDAZOLE 500 MG/100ML IV SOLN
500.0000 mg | Freq: Once | INTRAVENOUS | Status: AC
  Administered 2020-12-09: 500 mg via INTRAVENOUS
  Filled 2020-12-09: qty 100

## 2020-12-09 MED ORDER — ENOXAPARIN SODIUM 40 MG/0.4ML IJ SOSY
40.0000 mg | PREFILLED_SYRINGE | INTRAMUSCULAR | Status: DC
Start: 2020-12-09 — End: 2020-12-11
  Administered 2020-12-09 – 2020-12-10 (×2): 40 mg via SUBCUTANEOUS
  Filled 2020-12-09 (×2): qty 0.4

## 2020-12-09 MED ORDER — IOHEXOL 350 MG/ML SOLN
80.0000 mL | Freq: Once | INTRAVENOUS | Status: AC | PRN
  Administered 2020-12-09: 80 mL via INTRAVENOUS

## 2020-12-09 MED ORDER — VANCOMYCIN HCL IN DEXTROSE 1-5 GM/200ML-% IV SOLN
1000.0000 mg | Freq: Once | INTRAVENOUS | Status: AC
  Administered 2020-12-09: 1000 mg via INTRAVENOUS
  Filled 2020-12-09: qty 200

## 2020-12-09 MED ORDER — LACTATED RINGERS IV BOLUS (SEPSIS): 1000.0000 mL | Freq: Once | INTRAVENOUS | Status: DC

## 2020-12-09 MED ORDER — VANCOMYCIN HCL IN DEXTROSE 1-5 GM/200ML-% IV SOLN: 1000.0000 mg | Freq: Once | INTRAVENOUS | Status: DC

## 2020-12-09 MED ORDER — SODIUM CHLORIDE 0.9 % IV SOLN
2.0000 g | Freq: Three times a day (TID) | INTRAVENOUS | Status: DC
  Administered 2020-12-09 – 2020-12-12 (×8): 2 g via INTRAVENOUS
  Filled 2020-12-09 (×11): qty 2

## 2020-12-09 MED ORDER — VANCOMYCIN HCL IN DEXTROSE 1-5 GM/200ML-% IV SOLN
1000.0000 mg | Freq: Once | INTRAVENOUS | Status: AC
Start: 2020-12-09 — End: 2020-12-09
  Administered 2020-12-09: 1000 mg via INTRAVENOUS
  Filled 2020-12-09: qty 200

## 2020-12-09 MED ORDER — LACTATED RINGERS IV SOLN: INTRAVENOUS | Status: DC

## 2020-12-09 NOTE — ED Notes (Signed)
Patient transported to CT 

## 2020-12-09 NOTE — Progress Notes (Signed)
PHARMACY -  BRIEF ANTIBIOTIC NOTE   Pharmacy has received consult(s) for vancomycin and cefepime from an ED provider.  The patient's profile has been reviewed for ht/wt/allergies/indication/available labs.    One time order(s) placed for: Cefepime 2 g Vancomycin 2 g  Further antibiotics/pharmacy consults should be ordered by admitting physician if indicated.                       Thank you, Forde Dandy Rodel Glaspy 12/09/2020  3:57 PM

## 2020-12-09 NOTE — Progress Notes (Signed)
CODE SEPSIS - PHARMACY COMMUNICATION  **Broad Spectrum Antibiotics should be administered within 1 hour of Sepsis diagnosis**  Time Code Sepsis Called/Page Received: 1555  Antibiotics Ordered: vancomycin, metronidazole, and cefepime  Time of 1st antibiotic administration: [place] ,PharmD Clinical Pharmacist  12/09/2020  3:58 PM

## 2020-12-09 NOTE — ED Notes (Signed)
Multiple attempts to get 2nd set of blood cultures. Unable to obtain at this time. Dr. Ellender Hose EDP made aware, verbal orders to start antibiotics at this time.

## 2020-12-09 NOTE — Progress Notes (Signed)
Pharmacy Antibiotic Note  [name] [name] is a 60 y.o. female admitted on 12/09/2020. Pharmacy has been consulted for cefepime dosing.  Plan: Cefepime 2 g IV q8h  Height: 5\' 4"  (162.6 cm) Weight: 84.8 kg (187 lb) IBW/kg (Calculated) : 54.7  Temp (24hrs), Avg:99.8 F (37.7 C), Min:99.3 F (37.4 C), Max:100.6 F (38.1 C)  Recent Labs  Lab 12/06/20 0800 12/09/20 1546 12/09/20 1822  WBC 11.6* 32.2*  --   CREATININE 0.87 0.56  --   LATICACIDVEN  --  2.2* 3.2*    Estimated Creatinine Clearance: 76.8 mL/min (by C-G formula based on SCr of 0.56 mg/dL).    Allergies  Allergen Reactions   Dilantin [Phenytoin] Other (See Comments)    unknown   Penicillins Other (See Comments)    .Has patient had a PCN reaction causing immediate rash, facial/tongue/throat swelling, SOB or lightheadedness with hypotension: Unknown Has patient had a PCN reaction causing severe rash involving mucus membranes or skin necrosis: Unknown Has patient had a PCN reaction that required hospitalization: Unknown Has patient had a PCN reaction occurring within the last 10 years: Unknown If all of the above answers are "NO", then may proceed with Cephalosporin use.    Phenytoin Sodium Extended Other (See Comments)    Other reaction(s): Other (See Comments)   Phenytoin Sodium Extended Other (See Comments)    Other reaction(s): Other (See Comments)   Sulfa Antibiotics Rash and Other (See Comments)    unknown    Antimicrobials this admission: Cefepime 11/14 >> Metronidazole 11/14 x 1 Vancomycin 11/14 x 1   Microbiology results: 11/14 BCx: pending 11/14 UCx: pending    Thank you for allowing pharmacy to be a part of this patient's care.  Tawnya Crook, PharmD, BCPS Clinical Pharmacist 12/09/2020 8:59 PM

## 2020-12-09 NOTE — ED Notes (Signed)
Updated Zena Amos, pt sister/legal guardian at this time for pt's arrival.
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
## 2020-12-09 NOTE — ED Triage Notes (Signed)
Pt via EMS from [facility]. Pt has been sent over for an elevated WBC. Per staff, pt has been pending a urinalysis result that has not come back yet but pt also has crackles in her bilateral lobes per EMS. Pt has been on Rocephin for the past 7 days. Pt is alert but able to answer some question. Pt states that pt is confused "sometimes"

## 2020-12-09 NOTE — Sepsis Progress Note (Signed)
eLink monitoring code sepsis.  

## 2020-12-09 NOTE — ED Notes (Signed)
Phlebotomy called to obtain labs for patient.

## 2020-12-10 DIAGNOSIS — N3000 Acute cystitis without hematuria: Secondary | ICD-10-CM

## 2020-12-10 DIAGNOSIS — G8250 Quadriplegia, unspecified: Secondary | ICD-10-CM

## 2020-12-10 DIAGNOSIS — N200 Calculus of kidney: Secondary | ICD-10-CM

## 2020-12-10 DIAGNOSIS — K209 Esophagitis, unspecified without bleeding: Secondary | ICD-10-CM

## 2020-12-10 DIAGNOSIS — A419 Sepsis, unspecified organism: Secondary | ICD-10-CM | POA: Diagnosis not present

## 2020-12-10 LAB — BASIC METABOLIC PANEL
Anion gap: 4 — ABNORMAL LOW (ref 5–15)
BUN: 19 mg/dL (ref 8–23)
CO2: 29 mmol/L (ref 22–32)
Calcium: 8 mg/dL — ABNORMAL LOW (ref 8.9–10.3)
Chloride: 104 mmol/L (ref 98–111)
Creatinine, Ser: 0.67 mg/dL (ref 0.44–1.00)
GFR, Estimated: >60 mL/min (ref >60)
Glucose, Bld: 178 mg/dL — ABNORMAL HIGH (ref 70–99)
Potassium: 4.3 mmol/L (ref 3.5–5.1)
Sodium: 137 mmol/L (ref 135–145)

## 2020-12-10 LAB — CORTISOL-AM, BLOOD: Cortisol - AM: 7.7 ug/dL (ref 6.7–22.6)

## 2020-12-10 LAB — CBC
HCT: 21.8 % — ABNORMAL LOW (ref 36.0–46.0)
Hemoglobin: 7.1 g/dL — ABNORMAL LOW (ref 12.0–15.0)
MCH: 30 pg (ref 26.0–34.0)
MCHC: 32.6 g/dL (ref 30.0–36.0)
MCV: 92 fL (ref 80.0–100.0)
Platelets: 254 10*3/uL (ref 150–400)
RBC: 2.37 MIL/uL — ABNORMAL LOW (ref 3.87–5.11)
RDW: 14.1 % (ref 11.5–15.5)
WBC: 17.9 10*3/uL — ABNORMAL HIGH (ref 4.0–10.5)
nRBC: 0 % (ref 0.0–0.2)

## 2020-12-10 LAB — PROCALCITONIN: Procalcitonin: 0.12 ng/mL

## 2020-12-10 LAB — PROTIME-INR
INR: 1.1 (ref 0.8–1.2)
Prothrombin Time: 13.7 seconds (ref 11.4–15.2)

## 2020-12-10 MED ORDER — SENNOSIDES-DOCUSATE SODIUM 8.6-50 MG PO TABS
2.0000 | ORAL_TABLET | Freq: Two times a day (BID) | ORAL | Status: DC
  Administered 2020-12-10 – 2020-12-16 (×12): 2
  Filled 2020-12-10 (×12): qty 2

## 2020-12-10 MED ORDER — MAGNESIUM OXIDE 400 MG PO TABS
400.0000 mg | ORAL_TABLET | Freq: Every day | ORAL | Status: DC
  Administered 2020-12-11 – 2020-12-16 (×6): 400 mg
  Filled 2020-12-10 (×12): qty 1

## 2020-12-10 MED ORDER — BISACODYL 10 MG RE SUPP
10.0000 mg | Freq: Once | RECTAL | Status: AC
  Administered 2020-12-10: 10 mg via RECTAL
  Filled 2020-12-10: qty 1

## 2020-12-10 MED ORDER — POLYETHYLENE GLYCOL 3350 17 G PO PACK
17.0000 g | PACK | Freq: Every day | ORAL | Status: DC
  Administered 2020-12-10 – 2020-12-16 (×7): 17 g
  Filled 2020-12-10 (×7): qty 1

## 2020-12-10 MED ORDER — PANTOPRAZOLE 2 MG/ML SUSPENSION
40.0000 mg | Freq: Two times a day (BID) | ORAL | Status: DC
  Administered 2020-12-10 – 2020-12-11 (×2): 40 mg
  Filled 2020-12-10 (×3): qty 20

## 2020-12-10 MED ORDER — ASPIRIN 81 MG PO CHEW
81.0000 mg | CHEWABLE_TABLET | Freq: Every day | ORAL | Status: DC
  Administered 2020-12-10 – 2020-12-16 (×7): 81 mg
  Filled 2020-12-10 (×8): qty 1

## 2020-12-10 MED ORDER — FREE WATER
120.0000 mL | Freq: Every day | Status: DC
  Administered 2020-12-10 – 2020-12-16 (×29): 120 mL

## 2020-12-10 MED ORDER — OYSTER SHELL CALCIUM/D3 500-5 MG-MCG PO TABS
1.0000 | ORAL_TABLET | Freq: Two times a day (BID) | ORAL | Status: DC
  Administered 2020-12-11 – 2020-12-16 (×11): 1
  Filled 2020-12-10 (×11): qty 1

## 2020-12-10 MED ORDER — FERROUS SULFATE 220 (44 FE) MG/5ML PO ELIX
330.0000 mg | ORAL_SOLUTION | Freq: Every day | ORAL | Status: DC
  Administered 2020-12-11 – 2020-12-16 (×6): 330 mg
  Filled 2020-12-10 (×6): qty 7.5

## 2020-12-10 MED ORDER — FLUOXETINE HCL 20 MG/5ML PO SOLN
5.0000 mg | Freq: Every day | ORAL | Status: DC
  Administered 2020-12-11 – 2020-12-16 (×6): 5 mg
  Filled 2020-12-10 (×2): qty 1.25
  Filled 2020-12-10: qty 5
  Filled 2020-12-10 (×3): qty 1.25

## 2020-12-10 MED ORDER — DEXTROSE IN LACTATED RINGERS 5 % IV SOLN: INTRAVENOUS | Status: DC

## 2020-12-10 MED ORDER — SCOPOLAMINE 1 MG/3DAYS TD PT72
1.0000 | MEDICATED_PATCH | TRANSDERMAL | Status: DC
  Administered 2020-12-12 – 2020-12-15 (×2): 1.5 mg via TRANSDERMAL
  Filled 2020-12-10 (×2): qty 1

## 2020-12-10 MED ORDER — RISAQUAD PO CAPS
1.0000 | ORAL_CAPSULE | Freq: Every day | ORAL | Status: DC
  Administered 2020-12-11: 1 via ORAL
  Filled 2020-12-10: qty 1

## 2020-12-10 MED ORDER — DESMOPRESSIN ACETATE 0.1 MG PO TABS
200.0000 ug | ORAL_TABLET | Freq: Every day | ORAL | Status: DC
  Administered 2020-12-10 – 2020-12-15 (×6): 200 ug
  Filled 2020-12-10: qty 1
  Filled 2020-12-10 (×3): qty 2
  Filled 2020-12-10 (×3): qty 1
  Filled 2020-12-10 (×2): qty 2

## 2020-12-10 MED ORDER — OSMOLITE 1.5 CAL PO LIQD
237.0000 mL | Freq: Every day | ORAL | Status: DC
  Administered 2020-12-10 – 2020-12-16 (×29): 237 mL

## 2020-12-10 NOTE — Progress Notes (Addendum)
Progress Note    [name] [name]  ESP:[id number] DOB: Feb 15, 1960  DOA: 12/09/2020 PCP: Wyonia Hough [facility]      Brief Narrative:    Medical records reviewed and are as summarized below:  [name] [name] is a 60 y.o. female history significant for TIA was on antibiotics at the nursing home, depression, TBI, spastic quadriparesis, type II DM, hypertension, depression, gastritis, right staghorn calculus, ureteral lithiasis, who was sent from the nursing home to the hospital because of abnormal blood work and agitation. Reportedly, she was on IM Rocephin for UTI at the nursing home but she became very agitated and blood work rrevealed leukocytosis and hyperglycemia.  She was found to have severe sepsis secondary to acute UTI.  She was treated with empiric IV antibiotics and IV fluids.   Assessment/Plan:   Principal Problem:   Severe sepsis (HCC) Active Problems:   Chronic constipation   Cognitive impairment   UTI (urinary tract infection)   HTN (hypertension)   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HCC)   Esophagitis   Hyperglycemia due to type 2 diabetes mellitus (HCC)   Staghorn calculus    Body mass index is 29.63 kg/m.   Severe sepsis secondary to complicated UTI: Continue IV cefepime.  Follow-up urine and blood cultures.  Large nonobstructing right staghorn calculus, history of ureterolithiasis: Patient follow-up with urologist will strongly recommended.  Discussed with her sister, Linwood Dibbles.  Esophagitis on CT scan of abdomen, likely from GERD, continue IV Protonix.  Dysphagia: Continue IV fluids for now.  Consult dietitian to assist with enteral nutrition.  Her sister confirmed that patient is strict NPO.  Constipation: Treat with laxatives.  Dementia, TBI, spastic quadriparesis, depression: Continue psychotropics and supportive care  Other comorbidities include PVD, hypertension, type II DM  Plan of care was discussed with Linwood Dibbles, her sister, over the  phone    Diet Order             Diet NPO time specified  Diet effective now                      Consultants: None  Procedures: None    Medications:    Acidophilus Lactobacillus  175 mg Per Tube Daily   aspirin  81 mg Per Tube Daily   bisacodyl  10 mg Rectal Once   [START ON 12/11/2020] calcium-vitamin D  1 tablet Oral BID   desmopressin  200 mcg Per Tube QHS   enoxaparin (LOVENOX) injection  40 mg Subcutaneous Q24H   [START ON 12/11/2020] ferrous sulfate  330 mg Per Tube Daily   [START ON 12/11/2020] FLUoxetine  5 mg Per Tube Daily   [START ON 12/11/2020] magnesium oxide  400 mg Per Tube Daily   pantoprazole sodium  40 mg Per Tube BID   polyethylene glycol  17 g Per Tube Daily   [START ON 12/12/2020] scopolamine  1 patch Transdermal Q72H   senna-docusate  2 tablet Per Tube BID   Continuous Infusions:  ceFEPime (MAXIPIME) IV 2 g (12/10/20 1342)   lactated ringers       Anti-infectives (From admission, onward)    Start     Dose/Rate Route Frequency Ordered Stop   12/09/20 2200  ceFEPIme (MAXIPIME) 2 g in sodium chloride 0.9 % 100 mL IVPB  Status:  Discontinued        2 g 200 mL/hr over 30 Minutes Intravenous Every 8 hours 12/09/20 2100 12/09/20 2100   12/09/20 2200  ceFEPIme (MAXIPIME) 2  g in sodium chloride 0.9 % 100 mL IVPB        2 g 200 mL/hr over 30 Minutes Intravenous Every 8 hours 12/09/20 2100 12/16/20 2159   12/09/20 1700  vancomycin (VANCOCIN) IVPB 1000 mg/200 mL premix       See Hyperspace for full Linked Orders Report.   1,000 mg 200 mL/hr over 60 Minutes Intravenous  Once 12/09/20 1556 12/09/20 2106   12/09/20 1600  ceFEPIme (MAXIPIME) 2 g in sodium chloride 0.9 % 100 mL IVPB        2 g 200 mL/hr over 30 Minutes Intravenous  Once 12/09/20 1554 12/09/20 1713   12/09/20 1600  metroNIDAZOLE (FLAGYL) IVPB 500 mg        500 mg 100 mL/hr over 60 Minutes Intravenous  Once 12/09/20 1554 12/09/20 1902   12/09/20 1600  vancomycin (VANCOCIN) IVPB  1000 mg/200 mL premix  Status:  Discontinued        1,000 mg 200 mL/hr over 60 Minutes Intravenous  Once 12/09/20 1554 12/09/20 1556   12/09/20 1600  vancomycin (VANCOCIN) IVPB 1000 mg/200 mL premix       See Hyperspace for full Linked Orders Report.   1,000 mg 200 mL/hr over 60 Minutes Intravenous  Once 12/09/20 1556 12/09/20 2005              Family Communication/Anticipated D/C date and plan/Code Status   DVT prophylaxis: enoxaparin (LOVENOX) injection 40 mg Start: 12/09/20 2100     Code Status: DNR  Family Communication: Rochelle, sister Disposition Plan: Plan to discharge to SNF in 2 to 3 days   Status is: Inpatient  Remains inpatient appropriate because: IV antibiotics           Subjective:   Interval events noted.  She is confused and unable to provide any history.  Objective:    Vitals:   12/10/20 0348 12/10/20 0416 12/10/20 0753 12/10/20 1553  BP:  117/78 111/75 98/64  Pulse: (!) 121 (!) 117 (!) 104 91  Resp:  16    Temp:  99.5 F (37.5 C) 98.2 F (36.8 C) 98.2 F (36.8 C)  TempSrc:  Oral Oral Oral  SpO2:  98% (!) 88% 100%  Weight:      Height:       No data found.   Intake/Output Summary (Last 24 hours) at 12/10/2020 1558 Last data filed at 12/10/2020 1342 Gross per 24 hour  Intake 4277.2 ml  Output 1100 ml  Net 3177.2 ml   Filed Weights   12/09/20 1539 12/09/20 2340  Weight: 84.8 kg 78.3 kg    Exam:  GEN: NAD SKIN: Warm and dry EYES: EOMI ENT: MMM CV: RRR PULM: CTA B ABD: soft, ND, NT, +BS CNS: Alert but confused, quadriparesis EXT: No edema or tenderness        Data Reviewed:   I have personally reviewed following labs and imaging studies:  Labs: Labs show the following:   Basic Metabolic Panel: Recent Labs  Lab 12/06/20 0800 12/09/20 1546 12/09/20 2139 12/10/20 0758  NA 145 136  --  137  K 4.4 4.8  --  4.3  CL 108 99  --  104  CO2 29 27  --  29  GLUCOSE 282* 297*  --  178*  BUN 41* 33*  --   19  CREATININE 0.87 0.56 0.53 0.67  CALCIUM 8.6* 9.4  --  8.0*   GFR Estimated Creatinine Clearance: 73.8 mL/min (by C-G formula based on SCr of  0.67 mg/dL). Liver Function Tests: Recent Labs  Lab 12/09/20 1546  AST 18  ALT 14  ALKPHOS 93  BILITOT 0.5  PROT 7.2  ALBUMIN 3.2*   No results for input(s): LIPASE, AMYLASE in the last 168 hours. No results for input(s): AMMONIA in the last 168 hours. Coagulation profile Recent Labs  Lab 12/09/20 1546 12/10/20 0758  INR 0.9 1.1    CBC: Recent Labs  Lab 12/06/20 0800 12/09/20 1546 12/09/20 2139 12/10/20 0758  WBC 11.6* 32.2* 21.6* 17.9*  NEUTROABS 7.0 28.1*  --   --   HGB 9.8* 10.1* 8.3* 7.1*  HCT 30.6* 30.9* 26.7* 21.8*  MCV 94.2 91.7 93.7 92.0  PLT 281 386 264 254   Cardiac Enzymes: No results for input(s): CKTOTAL, CKMB, CKMBINDEX, TROPONINI in the last 168 hours. BNP (last 3 results) No results for input(s): PROBNP in the last 8760 hours. CBG: No results for input(s): GLUCAP in the last 168 hours. D-Dimer: No results for input(s): DDIMER in the last 72 hours. Hgb A1c: No results for input(s): HGBA1C in the last 72 hours. Lipid Profile: No results for input(s): CHOL, HDL, LDLCALC, TRIG, CHOLHDL, LDLDIRECT in the last 72 hours. Thyroid function studies: No results for input(s): TSH, T4TOTAL, T3FREE, THYROIDAB in the last 72 hours.  Invalid input(s): FREET3 Anemia work up: No results for input(s): VITAMINB12, FOLATE, FERRITIN, TIBC, IRON, RETICCTPCT in the last 72 hours. Sepsis Labs: Recent Labs  Lab 12/06/20 0800 12/09/20 1546 12/09/20 1822 12/09/20 2139 12/10/20 0758  PROCALCITON  --   --   --   --  0.12  WBC 11.6* 32.2*  --  21.6* 17.9*  LATICACIDVEN  --  2.2* 3.2*  --   --     Microbiology Recent Results (from the past 240 hour(s))  Blood Culture (routine x 2)     Status: None (Preliminary result)   Collection Time: 12/09/20  3:47 PM   Specimen: BLOOD  Result Value Ref Range Status   Specimen  Description BLOOD BLOOD RIGHT HAND  Final   Special Requests   Final    BOTTLES DRAWN AEROBIC AND ANAEROBIC Blood Culture adequate volume   Culture   Final    NO GROWTH < 24 HOURS Performed at [facility], [address], [place], [place] [address]    Report Status PENDING  Incomplete  Resp Panel by RT-PCR (Flu A&B, Covid) Nasopharyngeal Swab     Status: None   Collection Time: 12/09/20  4:16 PM   Specimen: Nasopharyngeal Swab; Nasopharyngeal(NP) swabs in vial transport medium  Result Value Ref Range Status   SARS Coronavirus 2 by RT PCR NEGATIVE NEGATIVE Final    Comment: (NOTE) SARS-CoV-2 target nucleic acids are NOT DETECTED.  The SARS-CoV-2 RNA is generally detectable in upper respiratory specimens during the acute phase of infection. The lowest concentration of SARS-CoV-2 viral copies this assay can detect is 138 copies/mL. A negative result does not preclude SARS-Cov-2 infection and should not be used as the sole basis for treatment or other patient management decisions. A negative result may occur with  improper specimen collection/handling, submission of specimen other than nasopharyngeal swab, presence of viral mutation(s) within the areas targeted by this assay, and inadequate number of viral copies(<138 copies/mL). A negative result must be combined with clinical observations, patient history, and epidemiological information. The expected result is Negative.  Fact Sheet for Patients:  EntrepreneurPulse.com.au  Fact Sheet for Healthcare Providers:  IncredibleEmployment.be  This test is no t yet approved or cleared  by the [place] and  has been authorized for detection and/or diagnosis of SARS-CoV-2 by FDA under an Emergency Use Authorization (EUA). This EUA will remain  in effect (meaning this test can be used) for the duration of the COVID-19 declaration under Section 564(b)(1) of the Act, 21 U.S.C.section  360bbb-3(b)(1), unless the authorization is terminated  or revoked sooner.       Influenza A by PCR NEGATIVE NEGATIVE Final   Influenza B by PCR NEGATIVE NEGATIVE Final    Comment: (NOTE) The Xpert Xpress SARS-CoV-2/FLU/RSV plus assay is intended as an aid in the diagnosis of influenza from Nasopharyngeal swab specimens and should not be used as a sole basis for treatment. Nasal washings and aspirates are unacceptable for Xpert Xpress SARS-CoV-2/FLU/RSV testing.  Fact Sheet for Patients: EntrepreneurPulse.com.au  Fact Sheet for Healthcare Providers: IncredibleEmployment.be  This test is not yet approved or cleared by the [place] FDA and has been authorized for detection and/or diagnosis of SARS-CoV-2 by FDA under an Emergency Use Authorization (EUA). This EUA will remain in effect (meaning this test can be used) for the duration of the COVID-19 declaration under Section 564(b)(1) of the Act, 21 U.S.C. section 360bbb-3(b)(1), unless the authorization is terminated or revoked.  Performed at [facility], [address] [place], [place] [address]   Blood Culture (routine x 2)     Status: None (Preliminary result)   Collection Time: 12/09/20  9:39 PM   Specimen: BLOOD  Result Value Ref Range Status   Specimen Description BLOOD BLOOD LEFT HAND  Final   Special Requests   Final    BOTTLES DRAWN AEROBIC ONLY Blood Culture adequate volume   Culture   Final    NO GROWTH < 24 HOURS Performed at [facility], [address], [place], [place] [address]    Report Status PENDING  Incomplete    Procedures and diagnostic studies:  CT ABDOMEN PELVIS W CONTRAST  Result Date: 12/09/2020 CLINICAL DATA:  Nausea vomiting. EXAM: CT ABDOMEN AND PELVIS WITH CONTRAST TECHNIQUE: Multidetector CT imaging of the abdomen and pelvis was performed using the standard protocol following bolus administration of intravenous contrast.  CONTRAST:  134mL OMNIPAQUE IOHEXOL 300 MG/ML SOLN, 24mL OMNIPAQUE IOHEXOL 350 MG/ML SOLN COMPARISON:  CT abdomen pelvis dated 09/16/2020. FINDINGS: Lower chest: Left lung base linear atelectasis/scarring. The visualized lung bases are otherwise clear. No intra-abdominal free air or free fluid. Hepatobiliary: The liver is unremarkable. No intrahepatic biliary dilatation. Cholecystectomy. Pancreas: Unremarkable. No pancreatic ductal dilatation or surrounding inflammatory changes. Spleen: Normal in size without focal abnormality. Adrenals/Urinary Tract: The right adrenal glands unremarkable. There is mild thickening of the left adrenal gland. There is no hydronephrosis on either side. There is a large nonobstructing right renal upper pole staghorn calculus similar to prior CT. Mild right renal parenchyma atrophy. There is diffuse bilateral renal cortical scarring and lobulation. There is a punctate nonobstructing left renal inferior pole calculus. The visualized ureters and urinary bladder appear unremarkable. Stomach/Bowel: There is a moderate size hiatal hernia. Mild circumferential thickening of the distal esophagus concerning for esophagitis, likely related to reflux. Percutaneous gastrostomy with balloon in the body of the stomach. There is moderate stool throughout the colon. Large fecal content within the rectal vault may represent impacted stool. There is no bowel obstruction. The appendix is normal. Vascular/Lymphatic: The abdominal aorta and IVC are unremarkable. No portal venous gas. There is no adenopathy. Reproductive: The uterus is grossly unremarkable. No adnexal masses. Other: None Musculoskeletal: Degenerative  changes of the spine. No acute osseous pathology. IMPRESSION: 1. Moderate size hiatal hernia with findings of esophagitis, likely related to reflux. 2. Moderate colonic stool burden with possible impacted stool in the rectal vault. No bowel obstruction. Normal appendix. 3. Large nonobstructing  right renal upper pole staghorn calculus similar to prior CT. No hydronephrosis. Electronically Signed   By: Anner Crete M.D.   On: 12/09/2020 18:21   DG Chest Port 1 View  Result Date: 12/09/2020 CLINICAL DATA:  Questionable sepsis. EXAM: PORTABLE CHEST 1 VIEW.  Patient is rotated. COMPARISON:  Chest x-ray 09/16/2020, CT chest 08/09/2017 FINDINGS: The heart and mediastinal contours are unchanged. No focal consolidation. No pulmonary edema. No pleural effusion. No pneumothorax. No acute osseous abnormality. Bilateral shoulder degenerative changes. A 3.6 cm round density overlies the upper abdomen. Correlate for an external finding overlying the patient. Right upper quadrant surgical clips. IMPRESSION: 1. No active disease. 2. A 3.6 cm round density overlies the upper abdomen. Correlate for an external finding overlying the patient. Electronically Signed   By: Iven Finn M.D.   On: 12/09/2020 16:15               LOS: 1 day   Josias Tomerlin  [facility]   Pager on [email or web address]. If 7PM-7AM, please contact night-coverage at [email or web address]     12/10/2020, 3:58 PM

## 2020-12-11 DIAGNOSIS — R6520 Severe sepsis without septic shock: Secondary | ICD-10-CM | POA: Diagnosis not present

## 2020-12-11 DIAGNOSIS — N3000 Acute cystitis without hematuria: Secondary | ICD-10-CM | POA: Diagnosis not present

## 2020-12-11 DIAGNOSIS — A419 Sepsis, unspecified organism: Secondary | ICD-10-CM | POA: Diagnosis not present

## 2020-12-11 DIAGNOSIS — K209 Esophagitis, unspecified without bleeding: Secondary | ICD-10-CM | POA: Diagnosis not present

## 2020-12-11 LAB — HEMOGLOBIN: Hemoglobin: 6.8 g/dL — ABNORMAL LOW (ref 12.0–15.0)

## 2020-12-11 LAB — PREPARE RBC (CROSSMATCH)

## 2020-12-11 LAB — HEMOGLOBIN AND HEMATOCRIT, BLOOD
HCT: 25.9 % — ABNORMAL LOW (ref 36.0–46.0)
Hemoglobin: 8.6 g/dL — ABNORMAL LOW (ref 12.0–15.0)

## 2020-12-11 LAB — IRON AND TIBC
Iron: 19 ug/dL — ABNORMAL LOW (ref 28–170)
Saturation Ratios: 7 % — ABNORMAL LOW (ref 10.4–31.8)
TIBC: 274 ug/dL (ref 250–450)
UIBC: 255 ug/dL

## 2020-12-11 LAB — VITAMIN B12: Vitamin B-12: 1257 pg/mL — ABNORMAL HIGH (ref 180–914)

## 2020-12-11 LAB — ABO/RH: ABO/RH(D): O POS

## 2020-12-11 MED ORDER — FLORANEX PO PACK
1.0000 g | PACK | Freq: Every day | ORAL | Status: DC
  Administered 2020-12-12: 13:00:00 1 g
  Filled 2020-12-11: qty 1

## 2020-12-11 MED ORDER — SODIUM CHLORIDE 0.9 % IV SOLN
300.0000 mg | Freq: Once | INTRAVENOUS | Status: AC
  Administered 2020-12-11: 300 mg via INTRAVENOUS
  Filled 2020-12-11: qty 300

## 2020-12-11 MED ORDER — PANTOPRAZOLE SODIUM 40 MG IV SOLR
40.0000 mg | Freq: Two times a day (BID) | INTRAVENOUS | Status: DC
  Administered 2020-12-11 – 2020-12-16 (×10): 40 mg via INTRAVENOUS
  Filled 2020-12-11 (×10): qty 40

## 2020-12-11 MED ORDER — SODIUM CHLORIDE 0.9 % IV SOLN: INTRAVENOUS | Status: DC | PRN

## 2020-12-11 MED ORDER — SODIUM CHLORIDE 0.9% IV SOLUTION: Freq: Once | INTRAVENOUS | Status: AC

## 2020-12-11 NOTE — Progress Notes (Signed)
Initial Nutrition Assessment  DOCUMENTATION CODES:   Not applicable  INTERVENTION:   -Continue bolus feeding regimen:  237 ml Osmolite 1.5 5 times daily via PEG  120 ml free water flush every 4 hours  Tube feeding regimen provides 1775 kcal (100% of needs), 75 grams of protein, and 905 ml of H2O. Total free water: 1625 ml daily   NUTRITION DIAGNOSIS:   Inadequate oral intake related to inability to eat as evidenced by NPO status.  GOAL:   Patient will meet greater than or equal to 90% of their needs  MONITOR:   Labs, Weight trends, TF tolerance, Skin, I & O's  REASON FOR ASSESSMENT:   Consult Enteral/tube feeding initiation and management  ASSESSMENT:   [name] [name] is a 60 y.o. female with medical history significant for Spastic quadriparesis, TBI with borderline intellectual functioning, DM, HTN, depression, gastritis, right staghorn calculus who was sent to the ED skilled nursing l facility because of abnormal blood work.  History is limited due to intellectual disabilities and is taken mostly from the ED provider who states that the nursing home reported that patient was being treated for UTI with IM Rocephin and started exhibiting agitation whereupon blood work which showed WBC of 20,000 and glucose in the 300s  Pt admitted with severe sepsis and complicated UTI.   Reviewed I/O's: -652 ml x 24 hours and +3.2 L since admission  UOP: 1.6 L x 24 hours  Case discussed with SLP. Pt is from [facility] and receives meds and nutrition via PEG.   Spoke with pt at bedside, who was pleasant and in good spirits today. She confirms that she is NPO and receives meds and nutrition through PEG. She is tolerating TF well and denies any nausea, vomiting, or abdominal pain.    Home TF regimen is 237 ml Osmolite 1.5 5 times daily and 100 ml free water flush every 4 hours. Complete regimen provides 1775 kcals, 75 grams protein, and 905 ml free water (1501 ml free water with  inclusion of free water flushes).   Reviewed wt hx; no wt loss over the past 5 months.   Medications reviewed and include calcium-vitamin D, ferrous sulfate, magnesium oxide, miralax, and senokot.   Lab Results  Component Value Date   HGBA1C 6.7 (H) 09/16/2020   PTA DM medications are 500 mg metformin daily.   Labs reviewed.   NUTRITION - FOCUSED PHYSICAL EXAM:  Flowsheet Row Most Recent Value  Orbital Region No depletion  Upper Arm Region No depletion  Thoracic and Lumbar Region No depletion  Buccal Region No depletion  Temple Region No depletion  Clavicle Bone Region No depletion  Clavicle and Acromion Bone Region No depletion  Scapular Bone Region No depletion  Dorsal Hand No depletion  Patellar Region No depletion  Anterior Thigh Region No depletion  Posterior Calf Region No depletion  Edema (RD Assessment) Mild  Hair Reviewed  Eyes Reviewed  Mouth Reviewed  Skin Reviewed  Nails Reviewed       Diet Order:   Diet Order             Diet NPO time specified  Diet effective now                   EDUCATION NEEDS:   No education needs have been identified at this time  Skin:  Skin Assessment: Reviewed RN Assessment  Last BM:  12/11/20  Height:   Ht Readings from Last 1 Encounters:  12/09/20 5\' 4"  (1.626 m)    Weight:   Wt Readings from Last 1 Encounters:  12/09/20 78.3 kg    Ideal Body Weight:  54.5 kg  BMI:  Body mass index is 29.63 kg/m.  Estimated Nutritional Needs:   Kcal:  7902-4097  Protein:  90-105 grams  Fluid:  > 1.7 L    Loistine Chance, RD, LDN, [place] Registered Dietitian II Certified Diabetes Care and Education Specialist Please refer to [facility] for RD and/or RD on-call/weekend/after hours pager

## 2020-12-11 NOTE — Progress Notes (Signed)
PROGRESS NOTE    [name] [name]  JJO:[id number] DOB: [birth year] DOA: 12/09/2020 PCP: [facility] Hough [facility]   Chief complaint.  Altered mental status. Brief Narrative:  [name] [name] is a 60 y.o. female history significant for TIA was on antibiotics at the nursing home, depression, TBI, spastic quadriparesis, type II DM, hypertension, depression, gastritis, right staghorn calculus, ureteral lithiasis, who was sent from the nursing home to the hospital because of abnormal blood work and agitation. Reportedly, she was on IM Rocephin for UTI at the nursing home but she became very agitated and blood work rrevealed leukocytosis and hyperglycemia.   She was found to have severe sepsis secondary to acute UTI.  She was treated with empiric IV antibiotics and IV fluids.   Assessment & Plan:   Principal Problem:   Severe sepsis ([place]) Active Problems:   Chronic constipation   Cognitive impairment   UTI (urinary tract infection)   HTN (hypertension)   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HCC)   Esophagitis   Hyperglycemia due to type 2 diabetes mellitus (HCC)   Staghorn calculus  Acute metabolic encephalopathy with baseline dementia. Severe sepsis secondary to complicated UTI. Complicated UTI with staghorn renal calculus. Discussed with patient sister who is the POA, patient appears to have some dementia and chronic aphasia due to TBI. Patient mental status was worse before came to the hospital. Blood culture and urine culture still pending.  She is currently covered with the cefepime based on previous culture results of urine.  Acute iron deficient anemia. Esophagitis. Patient had a hemoglobin of 7.1 today, she did not have anemia previously.  Reviewed previous labs. At this point, I cannot rule out acute blood loss anemia from GI source, will obtain GI consult. Start Protonix 40 mg twice a day IV. Monitor hemoglobin every 6 hours and transfuse as needed. Discussed with patient sister is the  POA, will give transfusion of 1 unit PRBC.  Dysphagia. Spasmic quadriplegia. TBI. Patient appeared does not clear airway well. Will follow closely.  No evidence of pneumonia at this time.      DVT prophylaxis: Lovenox Code Status: DNR Family Communication: Sister updated. Disposition Plan:    Status is: Inpatient  Remains inpatient appropriate because: Severity of disease, IV antibiotics, blood transfusion        I/O last 3 completed shifts: In: 5124.4 [I.V.:1430.1; IV Piggyback:3694.3] Out: 2000 [Urine:2000] No intake/output data recorded.     Consultants:  GI  Procedures: None  Antimicrobials: Cefepime.  Subjective: Patient is very confused, she appears not able to clear her airway.  Short of breath or hypoxia. She has no vomiting or nausea, no abdominal pain. No fever or chills.   Objective: Vitals:   12/10/20 1553 12/10/20 2010 12/11/20 0446 12/11/20 0840  BP: 98/64 108/60 (!) 108/59 102/72  Pulse: 91 88 84 (!) 104  Resp:   16   Temp: 98.2 F (36.8 C) 98.6 F (37 C) 98.5 F (36.9 C) 98 F (36.7 C)  TempSrc: Oral Oral Oral   SpO2: 100% 96%  96%  Weight:      Height:        Intake/Output Summary (Last 24 hours) at 12/11/2020 1045 Last data filed at 12/11/2020 0444 Gross per 24 hour  Intake 947.22 ml  Output 1600 ml  Net -652.78 ml   Filed Weights   12/09/20 1539 12/09/20 2340  Weight: 84.8 kg 78.3 kg    Examination:  General exam: Appears calm and comfortable  Respiratory system: Clear to auscultation. Respiratory  effort normal. Cardiovascular system: S1 & S2 heard, RRR. No JVD, murmurs, rubs, gallops or clicks. No pedal edema. Gastrointestinal system: Abdomen is nondistended, soft and nontender. No organomegaly or masses felt. Normal bowel sounds heard. Central nervous system: Alert and oriented x1.  Significant dysarthria. Extremities: Symmetric 5 x 5 power. Skin: No rashes, lesions or ulcers     Data Reviewed: I have  personally reviewed following labs and imaging studies  CBC: Recent Labs  Lab 12/06/20 0800 12/09/20 1546 12/09/20 2139 12/10/20 0758  WBC 11.6* 32.2* 21.6* 17.9*  NEUTROABS 7.0 28.1*  --   --   HGB 9.8* 10.1* 8.3* 7.1*  HCT 30.6* 30.9* 26.7* 21.8*  MCV 94.2 91.7 93.7 92.0  PLT 281 386 264 161   Basic Metabolic Panel: Recent Labs  Lab 12/06/20 0800 12/09/20 1546 12/09/20 2139 12/10/20 0758  NA 145 136  --  137  K 4.4 4.8  --  4.3  CL 108 99  --  104  CO2 29 27  --  29  GLUCOSE 282* 297*  --  178*  BUN 41* 33*  --  19  CREATININE 0.87 0.56 0.53 0.67  CALCIUM 8.6* 9.4  --  8.0*   GFR: Estimated Creatinine Clearance: 73.8 mL/min (by C-G formula based on SCr of 0.67 mg/dL). Liver Function Tests: Recent Labs  Lab 12/09/20 1546  AST 18  ALT 14  ALKPHOS 93  BILITOT 0.5  PROT 7.2  ALBUMIN 3.2*   No results for input(s): LIPASE, AMYLASE in the last 168 hours. No results for input(s): AMMONIA in the last 168 hours. Coagulation Profile: Recent Labs  Lab 12/09/20 1546 12/10/20 0758  INR 0.9 1.1   Cardiac Enzymes: No results for input(s): CKTOTAL, CKMB, CKMBINDEX, TROPONINI in the last 168 hours. BNP (last 3 results) No results for input(s): PROBNP in the last 8760 hours. HbA1C: No results for input(s): HGBA1C in the last 72 hours. CBG: No results for input(s): GLUCAP in the last 168 hours. Lipid Profile: No results for input(s): CHOL, HDL, LDLCALC, TRIG, CHOLHDL, LDLDIRECT in the last 72 hours. Thyroid Function Tests: No results for input(s): TSH, T4TOTAL, FREET4, T3FREE, THYROIDAB in the last 72 hours. Anemia Panel: Recent Labs    12/11/20 0820  TIBC 274  IRON 19*   Sepsis Labs: Recent Labs  Lab 12/09/20 1546 12/09/20 1822 12/10/20 0758  PROCALCITON  --   --  0.12  LATICACIDVEN 2.2* 3.2*  --     Recent Results (from the past 240 hour(s))  Blood Culture (routine x 2)     Status: None (Preliminary result)   Collection Time: 12/09/20  3:47 PM    Specimen: BLOOD  Result Value Ref Range Status   Specimen Description BLOOD BLOOD RIGHT HAND  Final   Special Requests   Final    BOTTLES DRAWN AEROBIC AND ANAEROBIC Blood Culture adequate volume   Culture   Final    NO GROWTH 2 DAYS Performed at [facility], [address] [place], [place] [address]    Report Status PENDING  Incomplete  Resp Panel by RT-PCR (Flu A&B, Covid) Nasopharyngeal Swab     Status: None   Collection Time: 12/09/20  4:16 PM   Specimen: Nasopharyngeal Swab; Nasopharyngeal(NP) swabs in vial transport medium  Result Value Ref Range Status   SARS Coronavirus 2 by RT PCR NEGATIVE NEGATIVE Final    Comment: (NOTE) SARS-CoV-2 target nucleic acids are NOT DETECTED.  The SARS-CoV-2 RNA is generally detectable in upper respiratory specimens  during the acute phase of infection. The lowest concentration of SARS-CoV-2 viral copies this assay can detect is 138 copies/mL. A negative result does not preclude SARS-Cov-2 infection and should not be used as the sole basis for treatment or other patient management decisions. A negative result may occur with  improper specimen collection/handling, submission of specimen other than nasopharyngeal swab, presence of viral mutation(s) within the areas targeted by this assay, and inadequate number of viral copies(<138 copies/mL). A negative result must be combined with clinical observations, patient history, and epidemiological information. The expected result is Negative.  Fact Sheet for Patients:  EntrepreneurPulse.com.au  Fact Sheet for Healthcare Providers:  IncredibleEmployment.be  This test is no t yet approved or cleared by the [place] FDA and  has been authorized for detection and/or diagnosis of SARS-CoV-2 by FDA under an Emergency Use Authorization (EUA). This EUA will remain  in effect (meaning this test can be used) for the duration of the COVID-19 declaration  under Section 564(b)(1) of the Act, 21 U.S.C.section 360bbb-3(b)(1), unless the authorization is terminated  or revoked sooner.       Influenza A by PCR NEGATIVE NEGATIVE Final   Influenza B by PCR NEGATIVE NEGATIVE Final    Comment: (NOTE) The Xpert Xpress SARS-CoV-2/FLU/RSV plus assay is intended as an aid in the diagnosis of influenza from Nasopharyngeal swab specimens and should not be used as a sole basis for treatment. Nasal washings and aspirates are unacceptable for Xpert Xpress SARS-CoV-2/FLU/RSV testing.  Fact Sheet for Patients: EntrepreneurPulse.com.au  Fact Sheet for Healthcare Providers: IncredibleEmployment.be  This test is not yet approved or cleared by the [place] FDA and has been authorized for detection and/or diagnosis of SARS-CoV-2 by FDA under an Emergency Use Authorization (EUA). This EUA will remain in effect (meaning this test can be used) for the duration of the COVID-19 declaration under Section 564(b)(1) of the Act, 21 U.S.C. section 360bbb-3(b)(1), unless the authorization is terminated or revoked.  Performed at [facility], [address], [place], [place] [address]   Urine Culture     Status: None (Preliminary result)   Collection Time: 12/09/20  4:16 PM   Specimen: In/Out Cath Urine  Result Value Ref Range Status   Specimen Description   Final    IN/OUT CATH URINE Performed at [facility], [address], [place], [place] [address]    Special Requests   Final    NONE Performed at [facility], [address] [place], [place] [address]    Culture   Final    CULTURE REINCUBATED FOR BETTER GROWTH Performed at [facility], [place] [address], [place], [place] [address]    Report Status PENDING  Incomplete  Blood Culture (routine x 2)     Status: None (Preliminary result)   Collection Time: 12/09/20  9:39 PM   Specimen: BLOOD  Result Value Ref Range Status    Specimen Description BLOOD BLOOD LEFT HAND  Final   Special Requests   Final    BOTTLES DRAWN AEROBIC ONLY Blood Culture adequate volume   Culture   Final    NO GROWTH 2 DAYS Performed at [facility], [address] [place], [place] [address]    Report Status PENDING  Incomplete         Radiology Studies: CT ABDOMEN PELVIS W CONTRAST  Result Date: 12/09/2020 CLINICAL DATA:  Nausea vomiting. EXAM: CT ABDOMEN AND PELVIS WITH CONTRAST TECHNIQUE: Multidetector CT imaging of the abdomen and pelvis  was performed using the standard protocol following bolus administration of intravenous contrast. CONTRAST:  136mL OMNIPAQUE IOHEXOL 300 MG/ML SOLN, 24mL OMNIPAQUE IOHEXOL 350 MG/ML SOLN COMPARISON:  CT abdomen pelvis dated 09/16/2020. FINDINGS: Lower chest: Left lung base linear atelectasis/scarring. The visualized lung bases are otherwise clear. No intra-abdominal free air or free fluid. Hepatobiliary: The liver is unremarkable. No intrahepatic biliary dilatation. Cholecystectomy. Pancreas: Unremarkable. No pancreatic ductal dilatation or surrounding inflammatory changes. Spleen: Normal in size without focal abnormality. Adrenals/Urinary Tract: The right adrenal glands unremarkable. There is mild thickening of the left adrenal gland. There is no hydronephrosis on either side. There is a large nonobstructing right renal upper pole staghorn calculus similar to prior CT. Mild right renal parenchyma atrophy. There is diffuse bilateral renal cortical scarring and lobulation. There is a punctate nonobstructing left renal inferior pole calculus. The visualized ureters and urinary bladder appear unremarkable. Stomach/Bowel: There is a moderate size hiatal hernia. Mild circumferential thickening of the distal esophagus concerning for esophagitis, likely related to reflux. Percutaneous gastrostomy with balloon in the body of the stomach. There is moderate stool throughout the colon. Large fecal content  within the rectal vault may represent impacted stool. There is no bowel obstruction. The appendix is normal. Vascular/Lymphatic: The abdominal aorta and IVC are unremarkable. No portal venous gas. There is no adenopathy. Reproductive: The uterus is grossly unremarkable. No adnexal masses. Other: None Musculoskeletal: Degenerative changes of the spine. No acute osseous pathology. IMPRESSION: 1. Moderate size hiatal hernia with findings of esophagitis, likely related to reflux. 2. Moderate colonic stool burden with possible impacted stool in the rectal vault. No bowel obstruction. Normal appendix. 3. Large nonobstructing right renal upper pole staghorn calculus similar to prior CT. No hydronephrosis. Electronically Signed   By: Anner Crete M.D.   On: 12/09/2020 18:21   DG Chest Port 1 View  Result Date: 12/09/2020 CLINICAL DATA:  Questionable sepsis. EXAM: PORTABLE CHEST 1 VIEW.  Patient is rotated. COMPARISON:  Chest x-ray 09/16/2020, CT chest 08/09/2017 FINDINGS: The heart and mediastinal contours are unchanged. No focal consolidation. No pulmonary edema. No pleural effusion. No pneumothorax. No acute osseous abnormality. Bilateral shoulder degenerative changes. A 3.6 cm round density overlies the upper abdomen. Correlate for an external finding overlying the patient. Right upper quadrant surgical clips. IMPRESSION: 1. No active disease. 2. A 3.6 cm round density overlies the upper abdomen. Correlate for an external finding overlying the patient. Electronically Signed   By: Iven Finn M.D.   On: 12/09/2020 16:15        Scheduled Meds:  acidophilus  1 capsule Oral Daily   aspirin  81 mg Per Tube Daily   calcium-vitamin D  1 tablet Per Tube BID   desmopressin  200 mcg Per Tube QHS   enoxaparin (LOVENOX) injection  40 mg Subcutaneous Q24H   feeding supplement (OSMOLITE 1.5 CAL)  237 mL Per Tube 5 X Daily   ferrous sulfate  330 mg Per Tube Daily   FLUoxetine  5 mg Per Tube Daily   free  water  120 mL Per Tube 5 X Daily   magnesium oxide  400 mg Per Tube Daily   pantoprazole sodium  40 mg Per Tube BID   polyethylene glycol  17 g Per Tube Daily   [START ON 12/12/2020] scopolamine  1 patch Transdermal Q72H   senna-docusate  2 tablet Per Tube BID   Continuous Infusions:  ceFEPime (MAXIPIME) IV 2 g (12/11/20 0550)   dextrose 5% lactated ringers 75 mL/hr  at 12/11/20 0315   iron sucrose     lactated ringers       LOS: 2 days    Time spent: 34 minutes.  More than 50% time involved in direct patient care.    Sharen Hones, MD [facility]   To contact the attending provider between 7A-7P or the covering provider during after hours 7P-7A, please log into the web site [email or web address] and access using universal [place] password for that web site. If you do not have the password, please call the hospital operator.  12/11/2020, 10:45 AM

## 2020-12-11 NOTE — TOC Initial Note (Signed)
Transition of Care [facility]) - Initial/Assessment Note    Patient Details  Name: [name] [name] MRN: [id number] Date of Birth: October 10, 1960  Transition of [facility]) CM/SW Contact:    Candie Chroman, LCSW Phone Number: 12/11/2020, 10:01 AM  Clinical Narrative:  Patient not fully oriented. Called patient's sister/legal guardian, introduced role, and explained that discharge planning would be discussed. Sister confirmed patient is a [facility]-term resident at [facility]. She has been living there about two years. Plan is to return at discharge. No further concerns. CSW encouraged patient's sister to contact CSW as needed. CSW will continue to follow patient and her sister for support and facilitate return to SNF once medically stable.                Expected Discharge Plan: Skilled Nursing Facility Barriers to Discharge: Continued Medical Work up   Patient Goals and CMS Choice Patient states their goals for this hospitalization and ongoing recovery are:: Patient not fully oriented.   Choice offered to / list presented to : NA  Expected Discharge Plan and Services Expected Discharge Plan: [place] Acute Care Choice: Resumption of Svcs/PTA Provider Living arrangements for the past 2 months: [place]                                      Prior Living Arrangements/Services Living arrangements for the past 2 months: [place] Lives with:: Facility Resident Patient language and need for interpreter reviewed:: Yes Do you feel safe going back to the place where you live?: Yes      Need for Family Participation in Patient Care: Yes (Comment) Care giver support system in place?: Yes (comment)   Criminal Activity/Legal Involvement Pertinent to Current Situation/Hospitalization: No - Comment as needed  Activities of Daily Living Home Assistive Devices/Equipment: Feeding equipment ADL Screening (condition at time of  admission) Patient's cognitive ability adequate to safely complete daily activities?: No Is the patient deaf or have difficulty hearing?: No Does the patient have difficulty seeing, even when wearing glasses/contacts?: No Does the patient have difficulty concentrating, remembering, or making decisions?: Yes Patient able to express need for assistance with ADLs?: Yes Does the patient have difficulty dressing or bathing?: Yes Independently performs ADLs?: No Communication: Independent Dressing (OT): Needs assistance Is this a change from baseline?: Pre-admission baseline Grooming: Needs assistance Is this a change from baseline?: Pre-admission baseline Feeding: Needs assistance Is this a change from baseline?: Pre-admission baseline Bathing: Needs assistance Is this a change from baseline?: Pre-admission baseline Toileting: Needs assistance Is this a change from baseline?: Pre-admission baseline Walks in Home: Dependent Is this a change from baseline?: Pre-admission baseline Does the patient have difficulty walking or climbing stairs?: Yes (pt bed-bound) Weakness of Legs: Both Weakness of Arms/Hands: Both  Permission Sought/Granted Permission sought to share information with : Facility Sport and exercise psychologist, Guardian, Family Supports    Share Information with NAME: Maximino Sarin  Permission granted to share info w AGENCY: [facility]  Permission granted to share info w Relationship: Sister/Guardian  Permission granted to share info w Contact Information: [phone]  Emotional Assessment Appearance:: Appears stated age Attitude/Demeanor/Rapport: Unable to Assess Affect (typically observed): Unable to Assess Orientation: : Oriented to Self, Oriented to Place Alcohol / Substance Use: Not Applicable Psych Involvement: No (comment)  Admission diagnosis:  Sepsis secondary to UTI ([place]) [A41.9,  N39.0] Severe sepsis ([place]) [A41.9, R65.20] Patient Active Problem List    Diagnosis Date Noted   Hyperglycemia due to type 2 diabetes mellitus ([place]) 12/09/2020   Staghorn calculus 12/09/2020   GI bleed 09/16/2020   Esophagitis    Depression 12/24/2017   CVA (cerebral vascular accident) ([place]) 08/09/2017   Chronic gastritis 06/09/2017   HTN (hypertension) 04/08/2017   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[place]) 04/08/2017   PVD (peripheral vascular disease) ([place]) 04/08/2017   Barrett's esophagus without dysplasia 04/08/2017   Senile purpura ([place]) 04/08/2017   Severe sepsis ([place]) 08/15/2016   UTI (urinary tract infection) 03/06/2016   Traumatic brain injury 12/03/2014   Chronic constipation 08/23/2014   Major depression, recurrent, chronic ([place]) 08/23/2014   Diabetes mellitus, type 2 ([place]) 08/23/2014   OP (osteoporosis) 08/23/2014   HLD (hyperlipidemia) 08/23/2014   Cognitive impairment 08/23/2014   PCP:  [facility]onia Hough [facility]:   [place], [place] [address] Phone: [phone] Fax: [phone]  [place], [place] [address] Phone: [phone] Fax: [phone]     Social Determinants of Health (SDOH) Interventions    Readmission Risk Interventions No flowsheet data found.

## 2020-12-11 NOTE — Plan of Care (Signed)
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
## 2020-12-11 NOTE — Progress Notes (Signed)
SLP Cancellation Note  Patient Details Name: SHAUNDREA CARRIGG MRN: [id number] DOB: 17-Sep-1960   Cancelled treatment:       Reason Eval/Treat Not Completed: SLP screened, no needs identified, will sign off (pt has a PEG tube for TFs Baseline -- this is Primary, and she does not take any po's at the NH per her Nursing staff there(TC). Recommend frequent oral care for hygiene and stimulation of swallowing.)      Orinda Kenner, MS, CCC-SLP Speech Language Pathologist Rehab Services [phone] [facility] 12/11/2020, 11:24 AM

## 2020-12-11 NOTE — NC FL2 (Signed)
Normal LEVEL OF CARE SCREENING TOOL     IDENTIFICATION  Patient Name: [name] [name] Birthdate: 06-30-1960 Sex: female Admission Date (Current Location): 12/09/2020  [facility] and [place] Number:  Engineering geologist and Address:  [facility], [address], [place], [place] [address]      Provider Number: [id number]  Attending Physician Name and Address:  Sharen Hones, MD  Relative Name and Phone Number:       Current Level of Care: Hospital Recommended Level of Care: [place] Prior Approval Number:    Date Approved/Denied:   PASRR Number: [id number]  Discharge Plan: SNF    Current Diagnoses: Patient Active Problem List   Diagnosis Date Noted   Hyperglycemia due to type 2 diabetes mellitus ([place]) 12/09/2020   Staghorn calculus 12/09/2020   GI bleed 09/16/2020   Esophagitis    Depression 12/24/2017   CVA (cerebral vascular accident) ([place]) 08/09/2017   Chronic gastritis 06/09/2017   HTN (hypertension) 04/08/2017   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[place]) 04/08/2017   PVD (peripheral vascular disease) ([place]) 04/08/2017   Barrett's esophagus without dysplasia 04/08/2017   Senile purpura ([place]) 04/08/2017   Severe sepsis ([place]) 08/15/2016   UTI (urinary tract infection) 03/06/2016   Traumatic brain injury 12/03/2014   Chronic constipation 08/23/2014   Major depression, recurrent, chronic ([place]) 08/23/2014   Diabetes mellitus, type 2 ([place]) 08/23/2014   OP (osteoporosis) 08/23/2014   HLD (hyperlipidemia) 08/23/2014   Cognitive impairment 08/23/2014    Orientation RESPIRATION BLADDER Height & Weight     Self, Place  Normal Incontinent Weight: 172 lb 9.9 oz (78.3 kg) Height:  5\' 4"  (162.6 cm)  BEHAVIORAL SYMPTOMS/MOOD NEUROLOGICAL BOWEL NUTRITION STATUS   (None)  (TBI) Incontinent Feeding tube  AMBULATORY STATUS COMMUNICATION OF NEEDS Skin     Verbally Skin abrasions, Bruising, Other (Comment) (Catheter entry/exit.)                        Personal Care Assistance Level of Assistance              Functional Limitations Info  Sight, Hearing, Speech Sight Info: Adequate Hearing Info: Adequate Speech Info: Adequate    SPECIAL CARE FACTORS FREQUENCY                       Contractures Contractures Info: Not present    Additional Factors Info  Code Status, Allergies Code Status Info: DNR Allergies Info: Dilantin (Phenytoin), Limonene, Penicillins, Phenytoin Sodium Extended, Phenytoin Sodium Extended, Sulfa Antibiotics           Current Medications (12/11/2020):  This is the current hospital active medication list Current Facility-Administered Medications  Medication Dose Route Frequency Provider Last Rate Last Admin   acetaminophen (TYLENOL) tablet 650 mg  650 mg Oral Q6H PRN Athena Masse, MD       Or   acetaminophen (TYLENOL) suppository 650 mg  650 mg Rectal Q6H PRN Athena Masse, MD       acidophilus (RISAQUAD) capsule 1 capsule  1 capsule Oral Daily Jennye Boroughs, MD       aspirin chewable tablet 81 mg  81 mg Per Tube Daily Jennye Boroughs, MD   81 mg at 12/10/20 1849   calcium-vitamin D (OSCAL WITH D) 500MG -200UNIT (5MCG) per tablet 1 tablet  1 tablet Per Tube BID Jennye Boroughs, MD       ceFEPIme (MAXIPIME) 2 g in sodium chloride 0.9 % 100  mL IVPB  2 g Intravenous Q8H [place], Abby K, RPH 200 mL/hr at 12/11/20 0550 2 g at 12/11/20 0550   desmopressin (DDAVP) tablet 200 mcg  200 mcg Per Tube QHS Jennye Boroughs, MD   200 mcg at 12/10/20 2340   dextrose 5 % in lactated ringers infusion   Intravenous Continuous Jennye Boroughs, MD 75 mL/hr at 12/11/20 0315 Infusion Verify at 12/11/20 0315   enoxaparin (LOVENOX) injection 40 mg  40 mg Subcutaneous Q24H Athena Masse, MD   40 mg at 12/10/20 2339   feeding supplement (OSMOLITE 1.5 CAL) liquid 237 mL  237 mL Per Tube 5 X Daily Jennye Boroughs, MD   237 mL at 12/11/20 0550   ferrous sulfate 220 (44 Fe) MG/5ML solution 330 mg  330  mg Per Tube Daily Jennye Boroughs, MD       FLUoxetine (PROZAC) 20 MG/5ML solution 5 mg  5 mg Per Tube Daily Jennye Boroughs, MD       free water 120 mL  120 mL Per Tube 5 X Daily Jennye Boroughs, MD   120 mL at 12/11/20 0550   lactated ringers bolus 1,000 mL  1,000 mL Intravenous Once Duffy Bruce, MD       magnesium oxide (MAG-OX) tablet 400 mg  400 mg Per Tube Daily Jennye Boroughs, MD       ondansetron [facility]) tablet 4 mg  4 mg Oral Q6H PRN Athena Masse, MD       Or   ondansetron [facility]) injection 4 mg  4 mg Intravenous Q6H PRN Athena Masse, MD       pantoprazole sodium (PROTONIX) 40 mg/20 mL oral suspension 40 mg  40 mg Per Tube BID Jennye Boroughs, MD   40 mg at 12/10/20 2340   polyethylene glycol (MIRALAX / GLYCOLAX) packet 17 g  17 g Per Tube Daily Jennye Boroughs, MD   17 g at 12/10/20 1848   [START ON 12/12/2020] scopolamine (TRANSDERM-SCOP) 1 MG/3DAYS 1.5 mg  1 patch Transdermal Q72H Jennye Boroughs, MD       senna-docusate (Senokot-S) tablet 2 tablet  2 tablet Per Tube BID Jennye Boroughs, MD   2 tablet at 12/10/20 2340     Discharge Medications: Please see discharge summary for a list of discharge medications.  Relevant Imaging Results:  Relevant Lab Results:   Additional Information SS#: [id number]  Candie Chroman, LCSW

## 2020-12-12 DIAGNOSIS — N3000 Acute cystitis without hematuria: Secondary | ICD-10-CM | POA: Diagnosis not present

## 2020-12-12 DIAGNOSIS — A419 Sepsis, unspecified organism: Secondary | ICD-10-CM | POA: Diagnosis not present

## 2020-12-12 DIAGNOSIS — R6520 Severe sepsis without septic shock: Secondary | ICD-10-CM | POA: Diagnosis not present

## 2020-12-12 LAB — CBC WITH DIFFERENTIAL/PLATELET
Abs Immature Granulocytes: 0.09 10*3/uL — ABNORMAL HIGH (ref 0.00–0.07)
Basophils Absolute: 0.1 10*3/uL (ref 0.0–0.1)
Basophils Relative: 1 %
Eosinophils Absolute: 0.5 10*3/uL (ref 0.0–0.5)
Eosinophils Relative: 5 %
HCT: 25 % — ABNORMAL LOW (ref 36.0–46.0)
Hemoglobin: 8.3 g/dL — ABNORMAL LOW (ref 12.0–15.0)
Immature Granulocytes: 1 %
Lymphocytes Relative: 25 %
Lymphs Abs: 2.4 10*3/uL (ref 0.7–4.0)
MCH: 29.9 pg (ref 26.0–34.0)
MCHC: 33.2 g/dL (ref 30.0–36.0)
MCV: 89.9 fL (ref 80.0–100.0)
Monocytes Absolute: 1 10*3/uL (ref 0.1–1.0)
Monocytes Relative: 11 %
Neutro Abs: 5.8 10*3/uL (ref 1.7–7.7)
Neutrophils Relative %: 57 %
Platelets: 244 10*3/uL (ref 150–400)
RBC: 2.78 MIL/uL — ABNORMAL LOW (ref 3.87–5.11)
RDW: 14.1 % (ref 11.5–15.5)
WBC: 9.9 10*3/uL (ref 4.0–10.5)
nRBC: 0 % (ref 0.0–0.2)

## 2020-12-12 LAB — BASIC METABOLIC PANEL
Anion gap: 5 (ref 5–15)
BUN: 12 mg/dL (ref 8–23)
CO2: 28 mmol/L (ref 22–32)
Calcium: 7.7 mg/dL — ABNORMAL LOW (ref 8.9–10.3)
Chloride: 102 mmol/L (ref 98–111)
Creatinine, Ser: 0.57 mg/dL (ref 0.44–1.00)
GFR, Estimated: >60 mL/min (ref >60)
Glucose, Bld: 310 mg/dL — ABNORMAL HIGH (ref 70–99)
Potassium: 4.4 mmol/L (ref 3.5–5.1)
Sodium: 135 mmol/L (ref 135–145)

## 2020-12-12 LAB — URINE CULTURE: Culture: 2000 — AB

## 2020-12-12 LAB — BPAM RBC
Blood Product Expiration Date: 202212192359
ISSUE DATE / TIME: 202211161516
Unit Type and Rh: 5100

## 2020-12-12 LAB — HEMOGLOBIN A1C
Hgb A1c MFr Bld: 7.1 % — ABNORMAL HIGH (ref 4.8–5.6)
Mean Plasma Glucose: 157.07 mg/dL

## 2020-12-12 LAB — MAGNESIUM: Magnesium: 2.1 mg/dL (ref 1.7–2.4)

## 2020-12-12 LAB — TYPE AND SCREEN
ABO/RH(D): O POS
Antibody Screen: NEGATIVE
Unit division: 0

## 2020-12-12 LAB — HEMOGLOBIN
Hemoglobin: 8.6 g/dL — ABNORMAL LOW (ref 12.0–15.0)
Hemoglobin: 8.5 g/dL — ABNORMAL LOW (ref 12.0–15.0)
Hemoglobin: 9 g/dL — ABNORMAL LOW (ref 12.0–15.0)

## 2020-12-12 LAB — GLUCOSE, CAPILLARY: Glucose-Capillary: 278 mg/dL — ABNORMAL HIGH (ref 70–99)

## 2020-12-12 MED ORDER — SODIUM CHLORIDE 0.9 % IV SOLN
1.0000 g | INTRAVENOUS | Status: DC
  Administered 2020-12-12 – 2020-12-13 (×2): 1 g via INTRAVENOUS
  Filled 2020-12-12: qty 1
  Filled 2020-12-12 (×2): qty 10

## 2020-12-12 MED ORDER — INSULIN ASPART 100 UNIT/ML IJ SOLN: 0.0000 [IU] | Freq: Every day | INTRAMUSCULAR | Status: DC

## 2020-12-12 MED ORDER — INSULIN ASPART 100 UNIT/ML IJ SOLN
0.0000 [IU] | Freq: Three times a day (TID) | INTRAMUSCULAR | Status: DC
  Administered 2020-12-12 (×2): 5 [IU] via SUBCUTANEOUS
  Administered 2020-12-13 (×2): 3 [IU] via SUBCUTANEOUS
  Filled 2020-12-12 (×4): qty 1

## 2020-12-12 MED ORDER — RISAQUAD PO CAPS
1.0000 | ORAL_CAPSULE | Freq: Every day | ORAL | Status: DC
  Administered 2020-12-13 – 2020-12-16 (×4): 1 via ORAL
  Filled 2020-12-12 (×4): qty 1

## 2020-12-12 NOTE — Progress Notes (Signed)
[facility] GI inpatient brief Progress Note  Patient seen for follow up anemia. Patient still stable, though confused.  Vitals:   12/12/20 0311 12/12/20 0919  BP: 137/79 129/62  Pulse: 95 96  Resp: 20 17  Temp: 98.3 F (36.8 C) 97.8 F (36.6 C)  SpO2: 96% 95%       Labs: CBC Latest Ref Rng & Units 12/12/2020 12/12/2020 12/12/2020  WBC 4.0 - 10.5 K/uL - - 9.9  Hemoglobin 12.0 - 15.0 g/dL 8.5(L) 8.6(L) 8.3(L)  Hematocrit 36.0 - 46.0 % - - 25.0(L)  Platelets 150 - 400 K/uL - - 244    CMP     Component Value Date/Time   NA 135 12/12/2020 0148   NA 139 10/16/2015 0851   NA 142 06/11/2013 0440   K 4.4 12/12/2020 0148   K 3.4 (L) 06/11/2013 0440   CL 102 12/12/2020 0148   CL 112 (H) 06/11/2013 0440   CO2 28 12/12/2020 0148   CO2 25 06/11/2013 0440   GLUCOSE 310 (H) 12/12/2020 0148   GLUCOSE 70 06/11/2013 0440   BUN 12 12/12/2020 0148   BUN 9 10/16/2015 0851   BUN 12 06/11/2013 0440   CREATININE 0.57 12/12/2020 0148   CREATININE 0.88 10/19/2016 1057   CALCIUM 7.7 (L) 12/12/2020 0148   CALCIUM 7.7 (L) 06/11/2013 0440   PROT 7.2 12/09/2020 1546   PROT 6.8 10/16/2015 0851   PROT 7.8 06/09/2013 1455   ALBUMIN 3.2 (L) 12/09/2020 1546   ALBUMIN 4.0 10/16/2015 0851   ALBUMIN 3.3 (L) 06/09/2013 1455   AST 18 12/09/2020 1546   AST 21 06/09/2013 1455   ALT 14 12/09/2020 1546   ALT 17 06/09/2013 1455   ALKPHOS 93 12/09/2020 1546   ALKPHOS 103 06/09/2013 1455   BILITOT 0.5 12/09/2020 1546   BILITOT 0.3 10/16/2015 0851   BILITOT 0.2 06/09/2013 1455   GFRNONAA >60 12/12/2020 0148   GFRNONAA 73 10/19/2016 1057   GFRAA >60 11/17/2017 2345   GFRAA 85 10/19/2016 1057      Impression:  1.Normocytic anemia - hemoglobin stable around 8.4 since blood transfusion. Hgb had decreased to 7.1 from 10.1 on admission earlier this week. Baseline hemoglobin 12-13. Unclear etiology at this time.  Still no signs of overt GI bleeding.   Severe sepsis 2/2 complicated UTI    Iron-deficiency anemia - iron panel today with iron sat 7%, dec iron   Esophagitis seen on CT scan - on PPI   [place]-segment Barrett's esophagus    Dysphagia - 2/2 Hx of TBI, strictly NPO. PEG intact.  On tube feeding.   Quadriplegia   Hx of TBI   Family History of colon cancer - overdue for high-risk screening    Plan:  Continue to monitor Hgb daily. 2.   Continue tube feeding and fluids for caloric and electrolyte needs. 3.   Continue empiric acid suppression. 4.   Defer elective endoluminal evaluation; At this time, given MMP and DNR status, would only consider urgent or emergent evaluation for recurrent bleeding and only with durable POA request.  Will sign off for now  Please call back if we can help  Thank you  T. Madolyn Frieze, M.D. ABIM Diplomate in Gastroenterology [place] [phone] - Cell

## 2020-12-12 NOTE — Progress Notes (Addendum)
Inpatient Diabetes Program Recommendations  AACE/ADA: New Consensus Statement on Inpatient Glycemic Control (2015)  Target Ranges:  Prepandial:   less than 140 mg/dL      [place]ak postprandial:   less than 180 mg/dL (1-2 hours)      Critically ill patients:  140 - 180 mg/dL    Latest Reference Range & Units 12/12/20 01:48  Glucose 70 - 99 mg/dL 310 (H)    Latest Reference Range & Units 09/16/20 16:50  Hemoglobin A1C 4.8 - 5.6 % 6.7 (H)    Admit with:  Severe sepsis (HCC) Complicated UTI (urinary tract infection) Staghorn calculus  History: DM  Home DM Meds: Metformin 500 mg daily  Current Orders: None   MD- Note patient with history of diabetes.  Gets Metformin at the SNF.  Lab glucose 310 this AM.  No orders for CBG checks nor Novolog SSI.  Please consider adding Novolog Sensitive Correction Scale/ SSI (0-9 units) 5 times per day in conjunction with the Osmolite tube feeds 6am/ 10am/ 2pm/ 6pm/ 10pm     --Will follow patient during hospitalization--  Wyn Quaker RN, MSN, CDE Diabetes Coordinator Inpatient Glycemic Control Team Team Pager: [phone] (8a-5p)

## 2020-12-12 NOTE — Progress Notes (Signed)
PROGRESS NOTE    [name] [name]  ZOX:[id number] DOB: Jun 19, 1960 DOA: 12/09/2020 PCP: [place]    Brief Narrative:  [name] [name] is a 60 y.o. female history significant for TIA was on antibiotics at the nursing home, depression, TBI, spastic quadriparesis, type II DM, hypertension, depression, gastritis, right staghorn calculus, ureteral lithiasis, who was sent from the nursing home to the hospital because of abnormal blood work and agitation. Reportedly, she was on IM Rocephin for UTI at the nursing home but she became very agitated and blood work rrevealed leukocytosis and hyperglycemia.   She was found to have severe sepsis secondary to acute UTI.  She was treated with empiric IV antibiotics and IV fluids.   Assessment & Plan:   Principal Problem:   Severe sepsis ([place]) Active Problems:   Chronic constipation   Cognitive impairment   UTI (urinary tract infection)   HTN (hypertension)   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HCC)   Esophagitis   Hyperglycemia due to type 2 diabetes mellitus (HCC)   Staghorn calculus  Acute metabolic encephalopathy with baseline dementia. Severe sepsis secondary to complicated UTI. Complicated UTI with staghorn renal calculus. Patient is doing better, urine culture came back with Staphylococcus hemolyticus, blood cultures so far no growth.  We will change antibiotics to Rocephin.   Acute iron deficient anemia. Esophagitis. Patient required 1 unit PRBC transfusion.  Hemoglobin has been stable.  She also received IV iron. Patient has been evaluated by GI, no plan for EGD due to multiple medical conditions. Continue IV Protonix.   Dysphagia. Spasmic quadriplegia. TBI with aphasia. Continue tube feeding.    DVT prophylaxis: Lovenox Code Status: DNR Family Communication:  Disposition Plan:      Status is: Inpatient   Remains inpatient appropriate because: Severity of disease, IV antibiotics,   I/O last 3 completed shifts: In:  1479.6 [I.V.:708.6; Blood:370; IV Piggyback:401.1] Out: 2400 [Urine:2400] Total I/O In: 480 [Other:480] Out: -      Consultants:  GI  Procedures: None  Antimicrobials: Rocephin Subjective: Patient is still sleepy, opens eyes, significant aphasia. She has no fever or chills. She has no abdominal pain or nausea vomiting. No short of breath or cough.  Objective: Vitals:   12/11/20 1751 12/11/20 2058 12/12/20 0311 12/12/20 0919  BP: 131/75 138/82 137/79 129/62  Pulse: (!) 101 89 95 96  Resp: 18 20 20 17   Temp: 97.6 F (36.4 C) 99.4 F (37.4 C) 98.3 F (36.8 C) 97.8 F (36.6 C)  TempSrc: Oral Oral Oral Oral  SpO2: 94% 96% 96% 95%  Weight:      Height:        Intake/Output Summary (Last 24 hours) at 12/12/2020 1348 Last data filed at 12/12/2020 1203 Gross per 24 hour  Intake 1078.65 ml  Output 1500 ml  Net -421.35 ml   Filed Weights   12/09/20 1539 12/09/20 2340  Weight: 84.8 kg 78.3 kg    Examination:  General exam: Appears calm and comfortable  Respiratory system: Clear to auscultation. Respiratory effort normal. Cardiovascular system: S1 & S2 heard, RRR. No JVD, murmurs, rubs, gallops or clicks. No pedal edema. Gastrointestinal system: Abdomen is nondistended, soft and nontender. No organomegaly or masses felt. Normal bowel sounds heard. Central nervous system: Alert and oriented x1.  Aphasic Extremities: Symmetric 5 x 5 power. Skin: No rashes, lesions or ulcers     Data Reviewed: I have personally reviewed following labs and imaging studies  CBC: Recent Labs  Lab 12/06/20 0800 12/09/20 1546 12/09/20  2139 12/10/20 0758 12/11/20 1348 12/11/20 1949 12/12/20 0148 12/12/20 0752  WBC 11.6* 32.2* 21.6* 17.9*  --   --  9.9  --   NEUTROABS 7.0 28.1*  --   --   --   --  5.8  --   HGB 9.8* 10.1* 8.3* 7.1* 6.8* 8.6* 8.3* 8.6*  HCT 30.6* 30.9* 26.7* 21.8*  --  25.9* 25.0*  --   MCV 94.2 91.7 93.7 92.0  --   --  89.9  --   PLT 281 386 264 254  --   --   244  --    Basic Metabolic Panel: Recent Labs  Lab 12/06/20 0800 12/09/20 1546 12/09/20 2139 12/10/20 0758 12/12/20 0148  NA 145 136  --  137 135  K 4.4 4.8  --  4.3 4.4  CL 108 99  --  104 102  CO2 29 27  --  29 28  GLUCOSE 282* 297*  --  178* 310*  BUN 41* 33*  --  19 12  CREATININE 0.87 0.56 0.53 0.67 0.57  CALCIUM 8.6* 9.4  --  8.0* 7.7*  MG  --   --   --   --  2.1   GFR: Estimated Creatinine Clearance: 73.8 mL/min (by C-G formula based on SCr of 0.57 mg/dL). Liver Function Tests: Recent Labs  Lab 12/09/20 1546  AST 18  ALT 14  ALKPHOS 93  BILITOT 0.5  PROT 7.2  ALBUMIN 3.2*   No results for input(s): LIPASE, AMYLASE in the last 168 hours. No results for input(s): AMMONIA in the last 168 hours. Coagulation Profile: Recent Labs  Lab 12/09/20 1546 12/10/20 0758  INR 0.9 1.1   Cardiac Enzymes: No results for input(s): CKTOTAL, CKMB, CKMBINDEX, TROPONINI in the last 168 hours. BNP (last 3 results) No results for input(s): PROBNP in the last 8760 hours. HbA1C: No results for input(s): HGBA1C in the last 72 hours. CBG: Recent Labs  Lab 12/12/20 1144  GLUCAP 278*   Lipid Profile: No results for input(s): CHOL, HDL, LDLCALC, TRIG, CHOLHDL, LDLDIRECT in the last 72 hours. Thyroid Function Tests: No results for input(s): TSH, T4TOTAL, FREET4, T3FREE, THYROIDAB in the last 72 hours. Anemia Panel: Recent Labs    12/11/20 0820  VITAMINB12 1,257*  TIBC 274  IRON 19*   Sepsis Labs: Recent Labs  Lab 12/09/20 1546 12/09/20 1822 12/10/20 0758  PROCALCITON  --   --  0.12  LATICACIDVEN 2.2* 3.2*  --     Recent Results (from the past 240 hour(s))  Blood Culture (routine x 2)     Status: None (Preliminary result)   Collection Time: 12/09/20  3:47 PM   Specimen: BLOOD  Result Value Ref Range Status   Specimen Description BLOOD BLOOD RIGHT HAND  Final   Special Requests   Final    BOTTLES DRAWN AEROBIC AND ANAEROBIC Blood Culture adequate volume    Culture   Final    NO GROWTH 3 DAYS Performed at [facility], [address] [place], [place] [address]    Report Status PENDING  Incomplete  Resp Panel by RT-PCR (Flu A&B, Covid) Nasopharyngeal Swab     Status: None   Collection Time: 12/09/20  4:16 PM   Specimen: Nasopharyngeal Swab; Nasopharyngeal(NP) swabs in vial transport medium  Result Value Ref Range Status   SARS Coronavirus 2 by RT PCR NEGATIVE NEGATIVE Final    Comment: (NOTE) SARS-CoV-2 target nucleic acids are NOT DETECTED.  The SARS-CoV-2 RNA is generally  detectable in upper respiratory specimens during the acute phase of infection. The lowest concentration of SARS-CoV-2 viral copies this assay can detect is 138 copies/mL. A negative result does not preclude SARS-Cov-2 infection and should not be used as the sole basis for treatment or other patient management decisions. A negative result may occur with  improper specimen collection/handling, submission of specimen other than nasopharyngeal swab, presence of viral mutation(s) within the areas targeted by this assay, and inadequate number of viral copies(<138 copies/mL). A negative result must be combined with clinical observations, patient history, and epidemiological information. The expected result is Negative.  Fact Sheet for Patients:  EntrepreneurPulse.com.au  Fact Sheet for Healthcare Providers:  IncredibleEmployment.be  This test is no t yet approved or cleared by the [place] FDA and  has been authorized for detection and/or diagnosis of SARS-CoV-2 by FDA under an Emergency Use Authorization (EUA). This EUA will remain  in effect (meaning this test can be used) for the duration of the COVID-19 declaration under Section 564(b)(1) of the Act, 21 U.S.C.section 360bbb-3(b)(1), unless the authorization is terminated  or revoked sooner.       Influenza A by PCR NEGATIVE NEGATIVE Final   Influenza B by PCR  NEGATIVE NEGATIVE Final    Comment: (NOTE) The Xpert Xpress SARS-CoV-2/FLU/RSV plus assay is intended as an aid in the diagnosis of influenza from Nasopharyngeal swab specimens and should not be used as a sole basis for treatment. Nasal washings and aspirates are unacceptable for Xpert Xpress SARS-CoV-2/FLU/RSV testing.  Fact Sheet for Patients: EntrepreneurPulse.com.au  Fact Sheet for Healthcare Providers: IncredibleEmployment.be  This test is not yet approved or cleared by the [place] FDA and has been authorized for detection and/or diagnosis of SARS-CoV-2 by FDA under an Emergency Use Authorization (EUA). This EUA will remain in effect (meaning this test can be used) for the duration of the COVID-19 declaration under Section 564(b)(1) of the Act, 21 U.S.C. section 360bbb-3(b)(1), unless the authorization is terminated or revoked.  Performed at [facility], [address] [place], [place] [address]   Urine Culture     Status: Abnormal   Collection Time: 12/09/20  4:16 PM   Specimen: In/Out Cath Urine  Result Value Ref Range Status   Specimen Description   Final    IN/OUT CATH URINE Performed at [facility], [address] [place], [place] [address]    Special Requests   Final    NONE Performed at [facility], [place], [place] [address]    Culture 2,000 COLONIES/mL STAPHYLOCOCCUS HAEMOLYTICUS (A)  Final   Report Status 12/12/2020 FINAL  Final   Organism ID, Bacteria STAPHYLOCOCCUS HAEMOLYTICUS (A)  Final      Susceptibility   Staphylococcus haemolyticus - MIC*    CIPROFLOXACIN >=8 RESISTANT Resistant     GENTAMICIN >=16 RESISTANT Resistant     NITROFURANTOIN <=16 SENSITIVE Sensitive     OXACILLIN >=4 RESISTANT Resistant     TETRACYCLINE <=1 SENSITIVE Sensitive     VANCOMYCIN 1 SENSITIVE Sensitive     TRIMETH/SULFA 160 RESISTANT Resistant     CLINDAMYCIN >=8 RESISTANT Resistant      RIFAMPIN >=32 RESISTANT Resistant     Inducible Clindamycin NEGATIVE Sensitive     * 2,000 COLONIES/mL STAPHYLOCOCCUS HAEMOLYTICUS  Blood Culture (routine x 2)     Status: None (Preliminary result)   Collection Time: 12/09/20  9:39 PM   Specimen: BLOOD  Result Value Ref Range Status   Specimen Description BLOOD  BLOOD LEFT HAND  Final   Special Requests   Final    BOTTLES DRAWN AEROBIC ONLY Blood Culture adequate volume   Culture   Final    NO GROWTH 3 DAYS Performed at [facility], [address], [place], [place] [address]    Report Status PENDING  Incomplete         Radiology Studies: No results found.      Scheduled Meds:  [START ON 12/13/2020] acidophilus  1 capsule Oral Daily   aspirin  81 mg Per Tube Daily   calcium-vitamin D  1 tablet Per Tube BID   desmopressin  200 mcg Per Tube QHS   feeding supplement (OSMOLITE 1.5 CAL)  237 mL Per Tube 5 X Daily   ferrous sulfate  330 mg Per Tube Daily   FLUoxetine  5 mg Per Tube Daily   free water  120 mL Per Tube 5 X Daily   insulin aspart  0-5 Units Subcutaneous QHS   insulin aspart  0-9 Units Subcutaneous TID WC   magnesium oxide  400 mg Per Tube Daily   pantoprazole (PROTONIX) IV  40 mg Intravenous Q12H   polyethylene glycol  17 g Per Tube Daily   scopolamine  1 patch Transdermal Q72H   senna-docusate  2 tablet Per Tube BID   Continuous Infusions:  sodium chloride 10 mL/hr at 12/12/20 0628     LOS: 3 days    Time spent: 27 minutes    Sharen Hones, MD [facility]   To contact the attending provider between 7A-7P or the covering provider during after hours 7P-7A, please log into the web site [email or web address] and access using universal [place] password for that web site. If you do not have the password, please call the hospital operator.  12/12/2020, 1:48 PM

## 2020-12-13 DIAGNOSIS — A419 Sepsis, unspecified organism: Secondary | ICD-10-CM | POA: Diagnosis not present

## 2020-12-13 DIAGNOSIS — R6520 Severe sepsis without septic shock: Secondary | ICD-10-CM | POA: Diagnosis not present

## 2020-12-13 DIAGNOSIS — N3000 Acute cystitis without hematuria: Secondary | ICD-10-CM | POA: Diagnosis not present

## 2020-12-13 LAB — GLUCOSE, CAPILLARY
Glucose-Capillary: 250 mg/dL — ABNORMAL HIGH (ref 70–99)
Glucose-Capillary: 131 mg/dL — ABNORMAL HIGH (ref 70–99)
Glucose-Capillary: 247 mg/dL — ABNORMAL HIGH (ref 70–99)
Glucose-Capillary: 245 mg/dL — ABNORMAL HIGH (ref 70–99)

## 2020-12-13 LAB — HEMOGLOBIN
Hemoglobin: 8.9 g/dL — ABNORMAL LOW (ref 12.0–15.0)
Hemoglobin: 8.6 g/dL — ABNORMAL LOW (ref 12.0–15.0)
Hemoglobin: 8.8 g/dL — ABNORMAL LOW (ref 12.0–15.0)

## 2020-12-13 MED ORDER — INSULIN ASPART 100 UNIT/ML IJ SOLN
0.0000 [IU] | Freq: Every day | INTRAMUSCULAR | Status: DC
  Administered 2020-12-13 – 2020-12-14 (×3): 2 [IU] via SUBCUTANEOUS
  Administered 2020-12-14 (×2): 3 [IU] via SUBCUTANEOUS
  Administered 2020-12-14: 2 [IU] via SUBCUTANEOUS
  Administered 2020-12-15: 3 [IU] via SUBCUTANEOUS
  Administered 2020-12-15: 5 [IU] via SUBCUTANEOUS
  Administered 2020-12-15: 2 [IU] via SUBCUTANEOUS
  Administered 2020-12-15 – 2020-12-16 (×2): 1 [IU] via SUBCUTANEOUS
  Filled 2020-12-13 (×12): qty 1

## 2020-12-13 NOTE — Progress Notes (Signed)
PROGRESS NOTE    [name] [name]  KDT:[id number] DOB: 05/25/1960 DOA: 12/09/2020 PCP: [place]    Brief Narrative:  [name] [name] is a 60 y.o. female history significant for TIA was on antibiotics at the nursing home, depression, TBI, spastic quadriparesis, type II DM, hypertension, depression, gastritis, right staghorn calculus, ureteral lithiasis, who was sent from the nursing home to the hospital because of abnormal blood work and agitation. Reportedly, she was on IM Rocephin for UTI at the nursing home but she became very agitated and blood work rrevealed leukocytosis and hyperglycemia.   She was found to have severe sepsis secondary to acute UTI.  She was treated with empiric IV antibiotics and IV fluids.   Assessment & Plan:   Principal Problem:   Severe sepsis ([place]) Active Problems:   Chronic constipation   Cognitive impairment   UTI (urinary tract infection)   HTN (hypertension)   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HCC)   Esophagitis   Hyperglycemia due to type 2 diabetes mellitus (HCC)   Staghorn calculus  Acute metabolic encephalopathy with baseline dementia. Severe sepsis secondary to complicated UTI. Complicated UTI with staghorn renal calculus. Patient is doing better, urine culture came back with Staphylococcus hemolyticus, blood cultures so far no growth.  Continue Rocephin   Acute iron deficient anemia. Esophagitis. Patient required 1 unit PRBC transfusion.  Hemoglobin has been stable.  She also received IV iron. Continue Protonix IV, hemoglobin has been stabilized. GI has no plan for EGD.   Dysphagia. Spasmic quadriplegia. TBI with aphasia. Continue tube feeding.     DVT prophylaxis: Lovenox Code Status: DNR Family Communication:  Disposition Plan:      Status is: Inpatient   Remains inpatient appropriate because: Severity of disease, IV antibiotics,    DVT prophylaxis:  Code Status:  Family Communication:  Disposition Plan:     Status is: Inpatient  Remains inpatient appropriate because: Severity of disease and IV treatment.   I/O last 3 completed shifts: In: 1651.7 [I.V.:68.7; Other:1440; IV Piggyback:143.1] Out: 2550 [Urine:2550] No intake/output data recorded.   Consultants:  GI   Procedures: None   Antimicrobials: Rocephin    Subjective: Patient condition seem to be improving, no fever or chills. Continue tolerating tube feeding.  No abdominal pain or nausea vomiting. No dysuria hematuria  No fever or chills.  Objective: Vitals:   12/12/20 1514 12/12/20 1948 12/13/20 0442 12/13/20 0900  BP: 119/70 (!) 111/92 119/76 107/66  Pulse: 93 99 72 77  Resp: 15 16 16 16   Temp: 98 F (36.7 C) 98.8 F (37.1 C) (!) 97.4 F (36.3 C) 98.5 F (36.9 C)  TempSrc: Oral  Oral Oral  SpO2: 94% 97% 100% 96%  Weight:      Height:        Intake/Output Summary (Last 24 hours) at 12/13/2020 1127 Last data filed at 12/12/2020 2200 Gross per 24 hour  Intake 1533.08 ml  Output 1500 ml  Net 33.08 ml   Filed Weights   12/09/20 1539 12/09/20 2340  Weight: 84.8 kg 78.3 kg    Examination:  General exam: Appears calm and comfortable  Respiratory system: Clear to auscultation. Respiratory effort normal. Cardiovascular system: S1 & S2 heard, RRR. No JVD, murmurs, rubs, gallops or clicks. No pedal edema. Gastrointestinal system: Abdomen is nondistended, soft and nontender. No organomegaly or masses felt. Normal bowel sounds heard. Central nervous system: Alert and oriented x1.  No focal neurological deficits. Extremities: Symmetric 5 x 5 power. Skin: No rashes, lesions or  ulcers Psychiatry:  Mood & affect appropriate.     Data Reviewed: I have personally reviewed following labs and imaging studies  CBC: Recent Labs  Lab 12/09/20 1546 12/09/20 2139 12/10/20 0758 12/11/20 1348 12/11/20 1949 12/12/20 0148 12/12/20 0752 12/12/20 1340 12/12/20 1952 12/13/20 0235 12/13/20 0753  WBC 32.2*  21.6* 17.9*  --   --  9.9  --   --   --   --   --   NEUTROABS 28.1*  --   --   --   --  5.8  --   --   --   --   --   HGB 10.1* 8.3* 7.1*   < > 8.6* 8.3* 8.6* 8.5* 9.0* 8.9* 8.6*  HCT 30.9* 26.7* 21.8*  --  25.9* 25.0*  --   --   --   --   --   MCV 91.7 93.7 92.0  --   --  89.9  --   --   --   --   --   PLT 386 264 254  --   --  244  --   --   --   --   --    < > = values in this interval not displayed.   Basic Metabolic Panel: Recent Labs  Lab 12/09/20 1546 12/09/20 2139 12/10/20 0758 12/12/20 0148  NA 136  --  137 135  K 4.8  --  4.3 4.4  CL 99  --  104 102  CO2 27  --  29 28  GLUCOSE 297*  --  178* 310*  BUN 33*  --  19 12  CREATININE 0.56 0.53 0.67 0.57  CALCIUM 9.4  --  8.0* 7.7*  MG  --   --   --  2.1   GFR: Estimated Creatinine Clearance: 73.8 mL/min (by C-G formula based on SCr of 0.57 mg/dL). Liver Function Tests: Recent Labs  Lab 12/09/20 1546  AST 18  ALT 14  ALKPHOS 93  BILITOT 0.5  PROT 7.2  ALBUMIN 3.2*   No results for input(s): LIPASE, AMYLASE in the last 168 hours. No results for input(s): AMMONIA in the last 168 hours. Coagulation Profile: Recent Labs  Lab 12/09/20 1546 12/10/20 0758  INR 0.9 1.1   Cardiac Enzymes: No results for input(s): CKTOTAL, CKMB, CKMBINDEX, TROPONINI in the last 168 hours. BNP (last 3 results) No results for input(s): PROBNP in the last 8760 hours. HbA1C: Recent Labs    12/12/20 0752  HGBA1C 7.1*   CBG: Recent Labs  Lab 12/12/20 1144 12/12/20 1631 12/12/20 2114 12/13/20 0900  GLUCAP 278* 250* 131* 247*   Lipid Profile: No results for input(s): CHOL, HDL, LDLCALC, TRIG, CHOLHDL, LDLDIRECT in the last 72 hours. Thyroid Function Tests: No results for input(s): TSH, T4TOTAL, FREET4, T3FREE, THYROIDAB in the last 72 hours. Anemia Panel: Recent Labs    12/11/20 0820  VITAMINB12 1,257*  TIBC 274  IRON 19*   Sepsis Labs: Recent Labs  Lab 12/09/20 1546 12/09/20 1822 12/10/20 0758  PROCALCITON  --    --  0.12  LATICACIDVEN 2.2* 3.2*  --     Recent Results (from the past 240 hour(s))  Blood Culture (routine x 2)     Status: None (Preliminary result)   Collection Time: 12/09/20  3:47 PM   Specimen: BLOOD  Result Value Ref Range Status   Specimen Description BLOOD BLOOD RIGHT HAND  Final   Special Requests   Final    BOTTLES DRAWN AEROBIC  AND ANAEROBIC Blood Culture adequate volume   Culture   Final    NO GROWTH 4 DAYS Performed at [facility], [address] [place], [place] [address]    Report Status PENDING  Incomplete  Resp Panel by RT-PCR (Flu A&B, Covid) Nasopharyngeal Swab     Status: None   Collection Time: 12/09/20  4:16 PM   Specimen: Nasopharyngeal Swab; Nasopharyngeal(NP) swabs in vial transport medium  Result Value Ref Range Status   SARS Coronavirus 2 by RT PCR NEGATIVE NEGATIVE Final    Comment: (NOTE) SARS-CoV-2 target nucleic acids are NOT DETECTED.  The SARS-CoV-2 RNA is generally detectable in upper respiratory specimens during the acute phase of infection. The lowest concentration of SARS-CoV-2 viral copies this assay can detect is 138 copies/mL. A negative result does not preclude SARS-Cov-2 infection and should not be used as the sole basis for treatment or other patient management decisions. A negative result may occur with  improper specimen collection/handling, submission of specimen other than nasopharyngeal swab, presence of viral mutation(s) within the areas targeted by this assay, and inadequate number of viral copies(<138 copies/mL). A negative result must be combined with clinical observations, patient history, and epidemiological information. The expected result is Negative.  Fact Sheet for Patients:  EntrepreneurPulse.com.au  Fact Sheet for Healthcare Providers:  IncredibleEmployment.be  This test is no t yet approved or cleared by the [place] FDA and  has been authorized for detection  and/or diagnosis of SARS-CoV-2 by FDA under an Emergency Use Authorization (EUA). This EUA will remain  in effect (meaning this test can be used) for the duration of the COVID-19 declaration under Section 564(b)(1) of the Act, 21 U.S.C.section 360bbb-3(b)(1), unless the authorization is terminated  or revoked sooner.       Influenza A by PCR NEGATIVE NEGATIVE Final   Influenza B by PCR NEGATIVE NEGATIVE Final    Comment: (NOTE) The Xpert Xpress SARS-CoV-2/FLU/RSV plus assay is intended as an aid in the diagnosis of influenza from Nasopharyngeal swab specimens and should not be used as a sole basis for treatment. Nasal washings and aspirates are unacceptable for Xpert Xpress SARS-CoV-2/FLU/RSV testing.  Fact Sheet for Patients: EntrepreneurPulse.com.au  Fact Sheet for Healthcare Providers: IncredibleEmployment.be  This test is not yet approved or cleared by the [place] FDA and has been authorized for detection and/or diagnosis of SARS-CoV-2 by FDA under an Emergency Use Authorization (EUA). This EUA will remain in effect (meaning this test can be used) for the duration of the COVID-19 declaration under Section 564(b)(1) of the Act, 21 U.S.C. section 360bbb-3(b)(1), unless the authorization is terminated or revoked.  Performed at [facility], [address] [place], [place] [address]   Urine Culture     Status: Abnormal   Collection Time: 12/09/20  4:16 PM   Specimen: In/Out Cath Urine  Result Value Ref Range Status   Specimen Description   Final    IN/OUT CATH URINE Performed at [facility], [address], [place], [place] [address]    Special Requests   Final    NONE Performed at [facility], [place], [place] [address]    Culture 2,000 COLONIES/mL STAPHYLOCOCCUS HAEMOLYTICUS (A)  Final   Report Status 12/12/2020 FINAL  Final   Organism ID, Bacteria STAPHYLOCOCCUS HAEMOLYTICUS  (A)  Final      Susceptibility   Staphylococcus haemolyticus - MIC*    CIPROFLOXACIN >=8 RESISTANT Resistant     GENTAMICIN >=16 RESISTANT Resistant  NITROFURANTOIN <=16 SENSITIVE Sensitive     OXACILLIN >=4 RESISTANT Resistant     TETRACYCLINE <=1 SENSITIVE Sensitive     VANCOMYCIN 1 SENSITIVE Sensitive     TRIMETH/SULFA 160 RESISTANT Resistant     CLINDAMYCIN >=8 RESISTANT Resistant     RIFAMPIN >=32 RESISTANT Resistant     Inducible Clindamycin NEGATIVE Sensitive     * 2,000 COLONIES/mL STAPHYLOCOCCUS HAEMOLYTICUS  Blood Culture (routine x 2)     Status: None (Preliminary result)   Collection Time: 12/09/20  9:39 PM   Specimen: BLOOD  Result Value Ref Range Status   Specimen Description BLOOD BLOOD LEFT HAND  Final   Special Requests   Final    BOTTLES DRAWN AEROBIC ONLY Blood Culture adequate volume   Culture   Final    NO GROWTH 4 DAYS Performed at [facility], [address], [place], [place] [address]    Report Status PENDING  Incomplete         Radiology Studies: No results found.      Scheduled Meds:  acidophilus  1 capsule Oral Daily   aspirin  81 mg Per Tube Daily   calcium-vitamin D  1 tablet Per Tube BID   desmopressin  200 mcg Per Tube QHS   feeding supplement (OSMOLITE 1.5 CAL)  237 mL Per Tube 5 X Daily   ferrous sulfate  330 mg Per Tube Daily   FLUoxetine  5 mg Per Tube Daily   free water  120 mL Per Tube 5 X Daily   insulin aspart  0-5 Units Subcutaneous QHS   insulin aspart  0-9 Units Subcutaneous TID WC   magnesium oxide  400 mg Per Tube Daily   pantoprazole (PROTONIX) IV  40 mg Intravenous Q12H   polyethylene glycol  17 g Per Tube Daily   scopolamine  1 patch Transdermal Q72H   senna-docusate  2 tablet Per Tube BID   Continuous Infusions:  sodium chloride 10 mL/hr at 12/12/20 2131   cefTRIAXone (ROCEPHIN)  IV 1 g (12/12/20 1447)     LOS: 4 days    Time spent: 27 minutes    Sharen Hones, MD [facility]   To contact the attending provider between 7A-7P or the covering provider during after hours 7P-7A, please log into the web site [email or web address] and access using universal [place] password for that web site. If you do not have the password, please call the hospital operator.  12/13/2020, 11:27 AM

## 2020-12-13 NOTE — Progress Notes (Signed)
Inpatient Diabetes Program Recommendations  AACE/ADA: New Consensus Statement on Inpatient Glycemic Control (2015)  Target Ranges:  Prepandial:   less than 140 mg/dL      Peak postprandial:   less than 180 mg/dL (1-2 hours)      Critically ill patients:  140 - 180 mg/dL    Latest Reference Range & Units 12/13/20 09:00 12/13/20 11:44  Glucose-Capillary 70 - 99 mg/dL 247 (H) 245 (H)   Admit with:  Severe sepsis (HCC) Complicated UTI (urinary tract infection) Staghorn calculus   History: DM   Home DM Meds: Metformin 500 mg daily   Current Orders: Novolog Sensitive Correction Scale/ SSI (0-9 units) TID AC + HS     MD- Please consider changing Novolog Sensitive Correction Scale/ SSI (0-9 units) to 5 times per day in conjunction with the Osmolite tube feeds 6am/ 10am/ 2pm/ 6pm/ 10pm    --Will follow patient during hospitalization--  Wyn Quaker RN, MSN, CDE Diabetes Coordinator Inpatient Glycemic Control Team Team Pager: [phone] (8a-5p)

## 2020-12-13 NOTE — TOC Progression Note (Signed)
Transition of Care [facility]) - Progression Note    Patient Details  Name: [name] MRN: [id number] Date of Birth: 08-01-1960  Transition of Care [facility]) CM/SW [place], LCSW Phone Number: 12/13/2020, 2:19 PM  Clinical Narrative:  Per MD, patient will likely be stable for discharge tomorrow. [facility] admissions coordinator said they are unable to accept weekend admissions due to staffing. Will plan for discharge Monday if stable.   Expected Discharge Plan: [place] Barriers to Discharge: Continued Medical Work up  Expected Discharge Plan and Services Expected Discharge Plan: [employer] Acute Care Choice: Resumption of Svcs/PTA Provider Living arrangements for the past 2 months: [place]                                       Social Determinants of Health (SDOH) Interventions    Readmission Risk Interventions No flowsheet data found.

## 2020-12-14 LAB — CULTURE, BLOOD (ROUTINE X 2)
Culture: NO GROWTH
Culture: NO GROWTH
Special Requests: ADEQUATE
Special Requests: ADEQUATE

## 2020-12-14 LAB — HEMOGLOBIN
Hemoglobin: 9.6 g/dL — ABNORMAL LOW (ref 12.0–15.0)
Hemoglobin: 9.1 g/dL — ABNORMAL LOW (ref 12.0–15.0)

## 2020-12-14 MED ORDER — INSULIN GLARGINE-YFGN 100 UNIT/ML ~~LOC~~ SOLN
10.0000 [IU] | Freq: Every day | SUBCUTANEOUS | Status: DC
  Administered 2020-12-14 – 2020-12-16 (×3): 10 [IU] via SUBCUTANEOUS
  Filled 2020-12-14 (×3): qty 0.1

## 2020-12-14 MED ORDER — SODIUM CHLORIDE 0.9 % IV SOLN
100.0000 mg | Freq: Two times a day (BID) | INTRAVENOUS | Status: DC
  Administered 2020-12-14 – 2020-12-16 (×5): 100 mg via INTRAVENOUS
  Filled 2020-12-14 (×6): qty 100

## 2020-12-14 NOTE — Progress Notes (Signed)
**[name] [name]** [name] [name]    [name] [name]  HYW:[id number] DOB: 1960-04-13 DOA: 12/09/2020 PCP: Wy[place] Hough [facility]   Chief complaint.  Altered mental status. Brief Narrative:  [name] is a 60 y.o. female history significant for TIA was on antibiotics at the nursing home, depression, TBI, spastic quadriparesis, type II DM, hypertension, depression, gastritis, right staghorn calculus, ureteral lithiasis, who was sent from the nursing home to the hospital because of abnormal blood work and agitation. Reportedly, she was on IM Rocephin for UTI at the nursing home but she became very agitated and blood work rrevealed leukocytosis and hyperglycemia.   She was found to have severe sepsis secondary to acute UTI.  She was treated with empiric IV antibiotics and IV fluids.   Assessment & Plan:   Principal Problem:   Severe sepsis ([place]) Active Problems:   Chronic constipation   Cognitive impairment   UTI (urinary tract infection)   HTN (hypertension)   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HCC)   Esophagitis   Hyperglycemia due to type 2 diabetes mellitus (HCC)   Staghorn calculus   Acute metabolic encephalopathy with baseline dementia. Severe sepsis secondary to complicated UTI. Complicated UTI with staghorn renal calculus due to Staphylococcus hemolyticus. Spoke with the nurse, patient mental status much improved since last night. Urine culture came back resistant to most antibiotics.  Will change to IV doxycycline, continue oral after discharge.    Acute iron deficient anemia. Esophagitis. Received IV iron and PRBC transfusion. Continued on Protonix IV.  Condition much improved, hemoglobin has been stabilized.   Dysphagia. Spasmic quadriplegia. TBI with aphasia. Continue tube feeding.  Uncontrolled type 2 diabetes with hyperglycemia. Glucose running high today, continue sliding scale insulin, added Lantus.     DVT prophylaxis: Lovenox Code Status: DNR Family Communication:   Disposition Plan:      Status is: Inpatient   Remains inpatient appropriate because: Severity of disease, IV antibiotics,         I/O last 3 completed shifts: In: 1671 [Other:960; NG/GT:711] Out: 31 [Urine:1700] Total I/O In: 357 [NG/GT:357] Out: -      Consultants:  GI  Procedures: None  Antimicrobials: Doxycycline.  Subjective: Doing much better today.  Spoke with RN, patient is more responsive since last night.  She no longer has any confusion today. She continue tube feeding, no abdominal pain or nausea vomiting.  No constipation. Denies any short of breath or cough. No fever or chills.  Objective: Vitals:   12/13/20 1531 12/13/20 2033 12/14/20 0535 12/14/20 0743  BP: 99/82 116/77 121/67 108/62  Pulse: 95 73 72 84  Resp: 17 18 18 12   Temp: 99 F (37.2 C) 98.1 F (36.7 C) 98.1 F (36.7 C) 98.4 F (36.9 C)  TempSrc: Oral   Oral  SpO2: 97% 98% 98% 97%  Weight:      Height:        Intake/Output Summary (Last 24 hours) at 12/14/2020 1013 Last data filed at 12/14/2020 1000 Gross per 24 hour  Intake 1311 ml  Output 1200 ml  Net 111 ml   Filed Weights   12/09/20 1539 12/09/20 2340  Weight: 84.8 kg 78.3 kg    Examination:  General exam: Appears calm and comfortable  Respiratory system: Clear to auscultation. Respiratory effort normal. Cardiovascular system: S1 & S2 heard, RRR. No JVD, murmurs, rubs, gallops or clicks. No pedal edema. Gastrointestinal system: Abdomen is nondistended, soft and nontender. No organomegaly or masses felt. Normal bowel sounds heard. Central nervous system: Alert and oriented  x3. No focal neurological deficits. Extremities: Symmetric 5 x 5 power. Skin: No rashes, lesions or ulcers Psychiatry: Judgement and insight appear normal. Mood & affect appropriate.     Data Reviewed: I have personally reviewed following labs and imaging studies  CBC: Recent Labs  Lab 12/09/20 1546 12/09/20 2139 12/10/20 0758  12/11/20 1348 12/11/20 1949 12/12/20 0148 12/12/20 0752 12/12/20 1952 12/13/20 0235 12/13/20 0753 12/13/20 1653 12/14/20 0602  WBC 32.2* 21.6* 17.9*  --   --  9.9  --   --   --   --   --   --   NEUTROABS 28.1*  --   --   --   --  5.8  --   --   --   --   --   --   HGB 10.1* 8.3* 7.1*   < > 8.6* 8.3*   < > 9.0* 8.9* 8.6* 8.8* 9.6*  HCT 30.9* 26.7* 21.8*  --  25.9* 25.0*  --   --   --   --   --   --   MCV 91.7 93.7 92.0  --   --  89.9  --   --   --   --   --   --   PLT 386 264 254  --   --  244  --   --   --   --   --   --    < > = values in this interval not displayed.   Basic Metabolic Panel: Recent Labs  Lab 12/09/20 1546 12/09/20 2139 12/10/20 0758 12/12/20 0148  NA 136  --  137 135  K 4.8  --  4.3 4.4  CL 99  --  104 102  CO2 27  --  29 28  GLUCOSE 297*  --  178* 310*  BUN 33*  --  19 12  CREATININE 0.56 0.53 0.67 0.57  CALCIUM 9.4  --  8.0* 7.7*  MG  --   --   --  2.1   GFR: Estimated Creatinine Clearance: 73.8 mL/min (by C-G formula based on SCr of 0.57 mg/dL). Liver Function Tests: Recent Labs  Lab 12/09/20 1546  AST 18  ALT 14  ALKPHOS 93  BILITOT 0.5  PROT 7.2  ALBUMIN 3.2*   No results for input(s): LIPASE, AMYLASE in the last 168 hours. No results for input(s): AMMONIA in the last 168 hours. Coagulation Profile: Recent Labs  Lab 12/09/20 1546 12/10/20 0758  INR 0.9 1.1   Cardiac Enzymes: No results for input(s): CKTOTAL, CKMB, CKMBINDEX, TROPONINI in the last 168 hours. BNP (last 3 results) No results for input(s): PROBNP in the last 8760 hours. HbA1C: Recent Labs    12/12/20 0752  HGBA1C 7.1*   CBG: Recent Labs  Lab 12/12/20 1144 12/12/20 1631 12/12/20 2114 12/13/20 0900 12/13/20 1144  GLUCAP 278* 250* 131* 247* 245*   Lipid Profile: No results for input(s): CHOL, HDL, LDLCALC, TRIG, CHOLHDL, LDLDIRECT in the last 72 hours. Thyroid Function Tests: No results for input(s): TSH, T4TOTAL, FREET4, T3FREE, THYROIDAB in the last 72  hours. Anemia Panel: No results for input(s): VITAMINB12, FOLATE, FERRITIN, TIBC, IRON, RETICCTPCT in the last 72 hours. Sepsis Labs: Recent Labs  Lab 12/09/20 1546 12/09/20 1822 12/10/20 0758  PROCALCITON  --   --  0.12  LATICACIDVEN 2.2* 3.2*  --     Recent Results (from the past 240 hour(s))  Blood Culture (routine x 2)     Status: None  Collection Time: 12/09/20  3:47 PM   Specimen: BLOOD  Result Value Ref Range Status   Specimen Description BLOOD BLOOD RIGHT HAND  Final   Special Requests   Final    BOTTLES DRAWN AEROBIC AND ANAEROBIC Blood Culture adequate volume   Culture   Final    NO GROWTH 5 DAYS Performed at [facility], [address] [place], [place] [address]    Report Status 12/14/2020 FINAL  Final  Resp Panel by RT-PCR (Flu A&B, Covid) Nasopharyngeal Swab     Status: None   Collection Time: 12/09/20  4:16 PM   Specimen: Nasopharyngeal Swab; Nasopharyngeal(NP) swabs in vial transport medium  Result Value Ref Range Status   SARS Coronavirus 2 by RT PCR NEGATIVE NEGATIVE Final    Comment: ([name]) SARS-CoV-2 target nucleic acids are NOT DETECTED.  The SARS-CoV-2 RNA is generally detectable in upper respiratory specimens during the acute phase of infection. The lowest concentration of SARS-CoV-2 viral copies this assay can detect is 138 copies/mL. A negative result does not preclude SARS-Cov-2 infection and should not be used as the sole basis for treatment or other patient management decisions. A negative result may occur with  improper specimen collection/handling, submission of specimen other than nasopharyngeal swab, presence of viral mutation(s) within the areas targeted by this assay, and inadequate number of viral copies(<138 copies/mL). A negative result must be combined with clinical observations, patient history, and epidemiological information. The expected result is Negative.  Fact Sheet for Patients:   EntrepreneurPulse.com.au  Fact Sheet for Healthcare Providers:  IncredibleEmployment.be  This test is no t yet approved or cleared by the [place] FDA and  has been authorized for detection and/or diagnosis of SARS-CoV-2 by FDA under an Emergency Use Authorization (EUA). This EUA will remain  in effect (meaning this test can be used) for the duration of the COVID-19 declaration under Section 564(b)(1) of the Act, 21 U.S.C.section 360bbb-3(b)(1), unless the authorization is terminated  or revoked sooner.       Influenza A by PCR NEGATIVE NEGATIVE Final   Influenza B by PCR NEGATIVE NEGATIVE Final    Comment: ([name]) The Xpert Xpress SARS-CoV-2/FLU/RSV plus assay is intended as an aid in the diagnosis of influenza from Nasopharyngeal swab specimens and should not be used as a sole basis for treatment. Nasal washings and aspirates are unacceptable for Xpert Xpress SARS-CoV-2/FLU/RSV testing.  Fact Sheet for Patients: EntrepreneurPulse.com.au  Fact Sheet for Healthcare Providers: IncredibleEmployment.be  This test is not yet approved or cleared by the [place] FDA and has been authorized for detection and/or diagnosis of SARS-CoV-2 by FDA under an Emergency Use Authorization (EUA). This EUA will remain in effect (meaning this test can be used) for the duration of the COVID-19 declaration under Section 564(b)(1) of the Act, 21 U.S.C. section 360bbb-3(b)(1), unless the authorization is terminated or revoked.  Performed at [facility], [address], [place], [place] [address]   Urine Culture     Status: Abnormal   Collection Time: 12/09/20  4:16 PM   Specimen: In/Out Cath Urine  Result Value Ref Range Status   Specimen Description   Final    IN/OUT CATH URINE Performed at [facility], [address], [place], [place] [address]    Special Requests   Final     NONE Performed at [facility], [place], [place] [address]    Culture 2,000 COLONIES/mL STAPHYLOCOCCUS HAEMOLYTICUS (A)  Final   Report Status 12/12/2020 FINAL  Final   Organism ID, Bacteria STAPHYLOCOCCUS HAEMOLYTICUS (A)  Final      Susceptibility   Staphylococcus haemolyticus - MIC*    CIPROFLOXACIN >=8 RESISTANT Resistant     GENTAMICIN >=16 RESISTANT Resistant     NITROFURANTOIN <=16 SENSITIVE Sensitive     OXACILLIN >=4 RESISTANT Resistant     TETRACYCLINE <=1 SENSITIVE Sensitive     VANCOMYCIN 1 SENSITIVE Sensitive     TRIMETH/SULFA 160 RESISTANT Resistant     CLINDAMYCIN >=8 RESISTANT Resistant     RIFAMPIN >=32 RESISTANT Resistant     Inducible Clindamycin NEGATIVE Sensitive     * 2,000 COLONIES/mL STAPHYLOCOCCUS HAEMOLYTICUS  Blood Culture (routine x 2)     Status: None   Collection Time: 12/09/20  9:39 PM   Specimen: BLOOD  Result Value Ref Range Status   Specimen Description BLOOD BLOOD LEFT HAND  Final   Special Requests   Final    BOTTLES DRAWN AEROBIC ONLY Blood Culture adequate volume   Culture   Final    NO GROWTH 5 DAYS Performed at [facility], [address] [place], [place] [address]    Report Status 12/14/2020 FINAL  Final         Radiology Studies: No results found.      Scheduled Meds:  acidophilus  1 capsule Oral Daily   aspirin  81 mg Per Tube Daily   calcium-vitamin D  1 tablet Per Tube BID   desmopressin  200 mcg Per Tube QHS   feeding supplement (OSMOLITE 1.5 CAL)  237 mL Per Tube 5 X Daily   ferrous sulfate  330 mg Per Tube Daily   FLUoxetine  5 mg Per Tube Daily   free water  120 mL Per Tube 5 X Daily   insulin aspart  0-9 Units Subcutaneous 5 X Daily   insulin glargine-yfgn  10 Units Subcutaneous Daily   magnesium oxide  400 mg Per Tube Daily   pantoprazole (PROTONIX) IV  40 mg Intravenous Q12H   polyethylene glycol  17 g Per Tube Daily   scopolamine  1 patch Transdermal Q72H    senna-docusate  2 tablet Per Tube BID   Continuous Infusions:  sodium chloride 10 mL/hr at 12/12/20 2131   doxycycline (VIBRAMYCIN) IV 100 mg (12/14/20 1013)     LOS: 5 days    Time spent: 27 minutes    Sharen Hones, MD [facility]   To contact the attending provider between 7A-7P or the covering provider during after hours 7P-7A, please log into the web site [email or web address] and access using universal [place] password for that web site. If you do not have the password, please call the hospital operator.  12/14/2020, 10:13 AM

## 2020-12-15 LAB — HEMOGLOBIN: Hemoglobin: 9.7 g/dL — ABNORMAL LOW (ref 12.0–15.0)

## 2020-12-15 NOTE — Progress Notes (Signed)
PROGRESS NOTE    [name] [name]  VHQ:[id number] DOB: 1960-09-13 DOA: 12/09/2020 PCP: [facility]    Brief Narrative:   [name] [name] is a 60 y.o. female history significant for TIA was on antibiotics at the nursing home, depression, TBI, spastic quadriparesis, type II DM, hypertension, depression, gastritis, right staghorn calculus, ureteral lithiasis, who was sent from the nursing home to the hospital because of abnormal blood work and agitation. Reportedly, she was on IM Rocephin for UTI at the nursing home but she became very agitated and blood work rrevealed leukocytosis and hyperglycemia.   She was found to have severe sepsis secondary to acute UTI.  She was treated with empiric IV antibiotics and IV fluids.  Assessment & Plan:   Principal Problem:   Severe sepsis ([place]) Active Problems:   Chronic constipation   Cognitive impairment   UTI (urinary tract infection)   HTN (hypertension)   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HCC)   Esophagitis   Hyperglycemia due to type 2 diabetes mellitus (HCC)   Staghorn calculus   Acute metabolic encephalopathy with baseline dementia. Severe sepsis secondary to complicated UTI. Complicated UTI with staghorn renal calculus due to Staphylococcus hemolyticus. Continue IV doxycycline, condition has improved.  Probably transfer to nursing home tomorrow.     Acute iron deficient anemia. Esophagitis. Received IV iron and PRBC transfusion. Has been seen by GI, no intention for inpatient EGD.   Dysphagia. Spasmic quadriplegia. TBI with aphasia. Continue tube feeding.   Uncontrolled type 2 diabetes with hyperglycemia. Continue current regimen.   DVT prophylaxis: SCDs Code Status: DNR Family Communication:  Disposition Plan:    Status is: Inpatient  Remains inpatient appropriate because: unsafe discharge plan        I/O last 3 completed shifts: In: 2288 [Other:717; NG/GT:1071; IV Piggyback:500] Out: 2550  [Urine:2550] Total I/O In: 607 [NG/GT:357; IV Piggyback:250] Out: -      Consultants:  GI  Procedures: None  Antimicrobials: Dxycycline  Subjective: Patient doing well today, currently does not have any short of breath or cough. Tolerating tube feeding without nausea vomiting No Dysuria hematuria  No fever or chills.  Objective: Vitals:   12/14/20 2316 12/15/20 0300 12/15/20 0525 12/15/20 0806  BP:   (!) 142/82 113/67  Pulse:  82 74 86  Resp: 14 14 18 13   Temp:   98.4 F (36.9 C) (!) 97.3 F (36.3 C)  TempSrc:   Oral   SpO2:  99% 97% 97%  Weight:      Height:        Intake/Output Summary (Last 24 hours) at 12/15/2020 1054 Last data filed at 12/15/2020 1005 Gross per 24 hour  Intake 1571 ml  Output 1350 ml  Net 221 ml   Filed Weights   12/09/20 1539 12/09/20 2340  Weight: 84.8 kg 78.3 kg    Examination:  General exam: Appears calm and comfortable  Respiratory system: Clear to auscultation. Respiratory effort normal. Cardiovascular system: S1 & S2 heard, RRR. No JVD, murmurs, rubs, gallops or clicks. No pedal edema. Gastrointestinal system: Abdomen is nondistended, soft and nontender. No organomegaly or masses felt. Normal bowel sounds heard. Central nervous system: Alert and oriented x2. No focal neurological deficits. Extremities: Symmetric 5 x 5 power. Skin: No rashes, lesions or ulcers Psychiatry: Judgement and insight appear normal. Mood & affect appropriate.     Data Reviewed: I have personally reviewed following labs and imaging studies  CBC: Recent Labs  Lab 12/09/20 1546 12/09/20 2139 12/10/20 0758 12/11/20 1348  12/11/20 1949 12/12/20 0148 12/12/20 0752 12/13/20 0753 12/13/20 1653 12/14/20 0602 12/14/20 1737 12/15/20 0653  WBC 32.2* 21.6* 17.9*  --   --  9.9  --   --   --   --   --   --   NEUTROABS 28.1*  --   --   --   --  5.8  --   --   --   --   --   --   HGB 10.1* 8.3* 7.1*   < > 8.6* 8.3*   < > 8.6* 8.8* 9.6* 9.1* 9.7*  HCT  30.9* 26.7* 21.8*  --  25.9* 25.0*  --   --   --   --   --   --   MCV 91.7 93.7 92.0  --   --  89.9  --   --   --   --   --   --   PLT 386 264 254  --   --  244  --   --   --   --   --   --    < > = values in this interval not displayed.   Basic Metabolic Panel: Recent Labs  Lab 12/09/20 1546 12/09/20 2139 12/10/20 0758 12/12/20 0148  NA 136  --  137 135  K 4.8  --  4.3 4.4  CL 99  --  104 102  CO2 27  --  29 28  GLUCOSE 297*  --  178* 310*  BUN 33*  --  19 12  CREATININE 0.56 0.53 0.67 0.57  CALCIUM 9.4  --  8.0* 7.7*  MG  --   --   --  2.1   GFR: Estimated Creatinine Clearance: 73.8 mL/min (by C-G formula based on SCr of 0.57 mg/dL). Liver Function Tests: Recent Labs  Lab 12/09/20 1546  AST 18  ALT 14  ALKPHOS 93  BILITOT 0.5  PROT 7.2  ALBUMIN 3.2*   No results for input(s): LIPASE, AMYLASE in the last 168 hours. No results for input(s): AMMONIA in the last 168 hours. Coagulation Profile: Recent Labs  Lab 12/09/20 1546 12/10/20 0758  INR 0.9 1.1   Cardiac Enzymes: No results for input(s): CKTOTAL, CKMB, CKMBINDEX, TROPONINI in the last 168 hours. BNP (last 3 results) No results for input(s): PROBNP in the last 8760 hours. HbA1C: No results for input(s): HGBA1C in the last 72 hours. CBG: Recent Labs  Lab 12/12/20 1144 12/12/20 1631 12/12/20 2114 12/13/20 0900 12/13/20 1144  GLUCAP 278* 250* 131* 247* 245*   Lipid Profile: No results for input(s): CHOL, HDL, LDLCALC, TRIG, CHOLHDL, LDLDIRECT in the last 72 hours. Thyroid Function Tests: No results for input(s): TSH, T4TOTAL, FREET4, T3FREE, THYROIDAB in the last 72 hours. Anemia Panel: No results for input(s): VITAMINB12, FOLATE, FERRITIN, TIBC, IRON, RETICCTPCT in the last 72 hours. Sepsis Labs: Recent Labs  Lab 12/09/20 1546 12/09/20 1822 12/10/20 0758  PROCALCITON  --   --  0.12  LATICACIDVEN 2.2* 3.2*  --     Recent Results (from the past 240 hour(s))  Blood Culture (routine x 2)      Status: None   Collection Time: 12/09/20  3:47 PM   Specimen: BLOOD  Result Value Ref Range Status   Specimen Description BLOOD BLOOD RIGHT HAND  Final   Special Requests   Final    BOTTLES DRAWN AEROBIC AND ANAEROBIC Blood Culture adequate volume   Culture   Final    NO GROWTH 5 DAYS  Performed at [facility], [address] [place], [place] [address]    Report Status 12/14/2020 FINAL  Final  Resp Panel by RT-PCR (Flu A&B, Covid) Nasopharyngeal Swab     Status: None   Collection Time: 12/09/20  4:16 PM   Specimen: Nasopharyngeal Swab; Nasopharyngeal(NP) swabs in vial transport medium  Result Value Ref Range Status   SARS Coronavirus 2 by RT PCR NEGATIVE NEGATIVE Final    Comment: (NOTE) SARS-CoV-2 target nucleic acids are NOT DETECTED.  The SARS-CoV-2 RNA is generally detectable in upper respiratory specimens during the acute phase of infection. The lowest concentration of SARS-CoV-2 viral copies this assay can detect is 138 copies/mL. A negative result does not preclude SARS-Cov-2 infection and should not be used as the sole basis for treatment or other patient management decisions. A negative result may occur with  improper specimen collection/handling, submission of specimen other than nasopharyngeal swab, presence of viral mutation(s) within the areas targeted by this assay, and inadequate number of viral copies(<138 copies/mL). A negative result must be combined with clinical observations, patient history, and epidemiological information. The expected result is Negative.  Fact Sheet for Patients:  EntrepreneurPulse.com.au  Fact Sheet for Healthcare Providers:  IncredibleEmployment.be  This test is no t yet approved or cleared by the [place] FDA and  has been authorized for detection and/or diagnosis of SARS-CoV-2 by FDA under an Emergency Use Authorization (EUA). This EUA will remain  in effect (meaning this test can  be used) for the duration of the COVID-19 declaration under Section 564(b)(1) of the Act, 21 U.S.C.section 360bbb-3(b)(1), unless the authorization is terminated  or revoked sooner.       Influenza A by PCR NEGATIVE NEGATIVE Final   Influenza B by PCR NEGATIVE NEGATIVE Final    Comment: (NOTE) The Xpert Xpress SARS-CoV-2/FLU/RSV plus assay is intended as an aid in the diagnosis of influenza from Nasopharyngeal swab specimens and should not be used as a sole basis for treatment. Nasal washings and aspirates are unacceptable for Xpert Xpress SARS-CoV-2/FLU/RSV testing.  Fact Sheet for Patients: EntrepreneurPulse.com.au  Fact Sheet for Healthcare Providers: IncredibleEmployment.be  This test is not yet approved or cleared by the [place] FDA and has been authorized for detection and/or diagnosis of SARS-CoV-2 by FDA under an Emergency Use Authorization (EUA). This EUA will remain in effect (meaning this test can be used) for the duration of the COVID-19 declaration under Section 564(b)(1) of the Act, 21 U.S.C. section 360bbb-3(b)(1), unless the authorization is terminated or revoked.  Performed at [facility], [address], [place], [place] [address]   Urine Culture     Status: Abnormal   Collection Time: 12/09/20  4:16 PM   Specimen: In/Out Cath Urine  Result Value Ref Range Status   Specimen Description   Final    IN/OUT CATH URINE Performed at [facility], [address], [place], [place] [address]    Special Requests   Final    NONE Performed at [facility], [place], [place] [address]    Culture 2,000 COLONIES/mL STAPHYLOCOCCUS HAEMOLYTICUS (A)  Final   Report Status 12/12/2020 FINAL  Final   Organism ID, Bacteria STAPHYLOCOCCUS HAEMOLYTICUS (A)  Final      Susceptibility   Staphylococcus haemolyticus - MIC*    CIPROFLOXACIN >=8 RESISTANT Resistant     GENTAMICIN >=16  RESISTANT Resistant     NITROFURANTOIN <=16 SENSITIVE Sensitive     OXACILLIN >=4 RESISTANT Resistant  TETRACYCLINE <=1 SENSITIVE Sensitive     VANCOMYCIN 1 SENSITIVE Sensitive     TRIMETH/SULFA 160 RESISTANT Resistant     CLINDAMYCIN >=8 RESISTANT Resistant     RIFAMPIN >=32 RESISTANT Resistant     Inducible Clindamycin NEGATIVE Sensitive     * 2,000 COLONIES/mL STAPHYLOCOCCUS HAEMOLYTICUS  Blood Culture (routine x 2)     Status: None   Collection Time: 12/09/20  9:39 PM   Specimen: BLOOD  Result Value Ref Range Status   Specimen Description BLOOD BLOOD LEFT HAND  Final   Special Requests   Final    BOTTLES DRAWN AEROBIC ONLY Blood Culture adequate volume   Culture   Final    NO GROWTH 5 DAYS Performed at [facility], [address], [place], [place] [address]    Report Status 12/14/2020 FINAL  Final         Radiology Studies: No results found.      Scheduled Meds:  acidophilus  1 capsule Oral Daily   aspirin  81 mg Per Tube Daily   calcium-vitamin D  1 tablet Per Tube BID   desmopressin  200 mcg Per Tube QHS   feeding supplement (OSMOLITE 1.5 CAL)  237 mL Per Tube 5 X Daily   ferrous sulfate  330 mg Per Tube Daily   FLUoxetine  5 mg Per Tube Daily   free water  120 mL Per Tube 5 X Daily   insulin aspart  0-9 Units Subcutaneous 5 X Daily   insulin glargine-yfgn  10 Units Subcutaneous Daily   magnesium oxide  400 mg Per Tube Daily   pantoprazole (PROTONIX) IV  40 mg Intravenous Q12H   polyethylene glycol  17 g Per Tube Daily   scopolamine  1 patch Transdermal Q72H   senna-docusate  2 tablet Per Tube BID   Continuous Infusions:  sodium chloride 10 mL/hr at 12/12/20 2131   doxycycline (VIBRAMYCIN) IV 100 mg (12/15/20 1003)     LOS: 6 days    Time spent: 22 minutes    Sharen Hones, MD [facility]   To contact the attending provider between 7A-7P or the covering provider during after hours 7P-7A, please log into the web site  [email or web address] and access using universal [place] password for that web site. If you do not have the password, please call the hospital operator.  12/15/2020, 10:54 AM

## 2020-12-16 LAB — GLUCOSE, CAPILLARY
Glucose-Capillary: 184 mg/dL — ABNORMAL HIGH (ref 70–99)
Glucose-Capillary: 186 mg/dL — ABNORMAL HIGH (ref 70–99)
Glucose-Capillary: 184 mg/dL — ABNORMAL HIGH (ref 70–99)
Glucose-Capillary: 226 mg/dL — ABNORMAL HIGH (ref 70–99)
Glucose-Capillary: 235 mg/dL — ABNORMAL HIGH (ref 70–99)
Glucose-Capillary: 190 mg/dL — ABNORMAL HIGH (ref 70–99)
Glucose-Capillary: 169 mg/dL — ABNORMAL HIGH (ref 70–99)
Glucose-Capillary: 137 mg/dL — ABNORMAL HIGH (ref 70–99)
Glucose-Capillary: 161 mg/dL — ABNORMAL HIGH (ref 70–99)
Glucose-Capillary: 246 mg/dL — ABNORMAL HIGH (ref 70–99)
Glucose-Capillary: 251 mg/dL — ABNORMAL HIGH (ref 70–99)
Glucose-Capillary: 148 mg/dL — ABNORMAL HIGH (ref 70–99)
Glucose-Capillary: 174 mg/dL — ABNORMAL HIGH (ref 70–99)
Glucose-Capillary: 104 mg/dL — ABNORMAL HIGH (ref 70–99)
Glucose-Capillary: 101 mg/dL — ABNORMAL HIGH (ref 70–99)
Glucose-Capillary: 121 mg/dL — ABNORMAL HIGH (ref 70–99)
Glucose-Capillary: 123 mg/dL — ABNORMAL HIGH (ref 70–99)

## 2020-12-16 LAB — CBC WITH DIFFERENTIAL/PLATELET
Abs Immature Granulocytes: 0.06 10*3/uL (ref 0.00–0.07)
Basophils Absolute: 0.1 10*3/uL (ref 0.0–0.1)
Basophils Relative: 1 %
Eosinophils Absolute: 0.4 10*3/uL (ref 0.0–0.5)
Eosinophils Relative: 5 %
HCT: 31.4 % — ABNORMAL LOW (ref 36.0–46.0)
Hemoglobin: 10.3 g/dL — ABNORMAL LOW (ref 12.0–15.0)
Immature Granulocytes: 1 %
Lymphocytes Relative: 33 %
Lymphs Abs: 2.7 10*3/uL (ref 0.7–4.0)
MCH: 31.4 pg (ref 26.0–34.0)
MCHC: 32.8 g/dL (ref 30.0–36.0)
MCV: 95.7 fL (ref 80.0–100.0)
Monocytes Absolute: 0.8 10*3/uL (ref 0.1–1.0)
Monocytes Relative: 10 %
Neutro Abs: 4.1 10*3/uL (ref 1.7–7.7)
Neutrophils Relative %: 50 %
Platelets: 344 10*3/uL (ref 150–400)
RBC: 3.28 MIL/uL — ABNORMAL LOW (ref 3.87–5.11)
RDW: 15.9 % — ABNORMAL HIGH (ref 11.5–15.5)
WBC: 8.1 10*3/uL (ref 4.0–10.5)
nRBC: 0 % (ref 0.0–0.2)

## 2020-12-16 LAB — BASIC METABOLIC PANEL
Anion gap: 6 (ref 5–15)
BUN: 13 mg/dL (ref 8–23)
CO2: 29 mmol/L (ref 22–32)
Calcium: 8.8 mg/dL — ABNORMAL LOW (ref 8.9–10.3)
Chloride: 104 mmol/L (ref 98–111)
Creatinine, Ser: 0.45 mg/dL (ref 0.44–1.00)
GFR, Estimated: >60 mL/min (ref >60)
Glucose, Bld: 105 mg/dL — ABNORMAL HIGH (ref 70–99)
Potassium: 4.2 mmol/L (ref 3.5–5.1)
Sodium: 139 mmol/L (ref 135–145)

## 2020-12-16 LAB — RESP PANEL BY RT-PCR (FLU A&B, COVID) ARPGX2
Influenza A by PCR: NEGATIVE
Influenza B by PCR: NEGATIVE
SARS Coronavirus 2 by RT PCR: NEGATIVE

## 2020-12-16 MED ORDER — DOXYCYCLINE MONOHYDRATE 100 MG PO TABS: 100.0000 mg | ORAL_TABLET | Freq: Two times a day (BID) | ORAL | 0 refills | Status: AC

## 2020-12-16 NOTE — Progress Notes (Signed)
Patient transferring to [facility]. Report given to [facility] Nurse.

## 2020-12-16 NOTE — Discharge Summary (Signed)
Physician Discharge Summary  Patient ID: [name] [name] MRN: [id number] DOB/AGE: [birth year] 60 y.o.  Admit date: 12/09/2020 Discharge date: 12/16/2020  Admission Diagnoses:  Discharge Diagnoses:  Principal Problem:   Severe sepsis ([place]) Active Problems:   Chronic constipation   Cognitive impairment   UTI (urinary tract infection)   HTN (hypertension)   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HCC)   Esophagitis   Hyperglycemia due to type 2 diabetes mellitus ([place])   Staghorn calculus   Discharged Condition: fair  Hospital Course:  [name] [name] is a 60 y.o. female history significant for TIA was on antibiotics at the nursing home, depression, TBI, spastic quadriparesis, type II DM, hypertension, depression, gastritis, right staghorn calculus, ureteral lithiasis, who was sent from the nursing home to the hospital because of abnormal blood work and agitation. Reportedly, she was on IM Rocephin for UTI at the nursing home but she became very agitated and blood work rrevealed leukocytosis and hyperglycemia.   She was found to have severe sepsis secondary to acute UTI.  She was treated with empiric IV antibiotics and IV fluids.  Acute metabolic encephalopathy with baseline dementia. Severe sepsis secondary to complicated UTI. Complicated UTI with staghorn renal calculus due to Staphylococcus hemolyticus. Continued IV doxycycline, condition has improved.    At discharge, will will change to oral doxycycline for additional 4 days.   Acute iron deficient anemia. Esophagitis. Received IV iron and PRBC transfusion. Has been seen by GI, no intention for inpatient EGD. Will continue Protonix twice a day.   Dysphagia. Spasmic quadriplegia. TBI with aphasia. Continue tube feeding.   Uncontrolled type 2 diabetes with hyperglycemia. Resume outpatient regimen.  Consults: None  Significant Diagnostic Studies:   Treatments: IVF, antibiotics  Discharge Exam: Blood pressure 132/67, pulse 84,  temperature 98.1 F (36.7 C), temperature source Oral, resp. rate 19, height 5\' 4"  (1.626 m), weight 78.3 kg, SpO2 100 %. General appearance: alert and cooperative Resp: clear to auscultation bilaterally Cardio: regular rate and rhythm, S1, S2 normal, no murmur, click, rub or gallop GI: soft, non-tender; bowel sounds normal; no masses,  no organomegaly Extremities: extremities normal, atraumatic, no cyanosis or edema  Disposition: Discharge disposition: 03-Skilled Nursing Facility       Discharge Instructions     Diet general   Complete by: As directed    NPO, Tube feeding only   Increase activity slowly   Complete by: As directed       Allergies as of 12/16/2020       Reactions   Dilantin [phenytoin] Other (See Comments)   unknown   Limonene    Penicillins Other (See Comments)   .Has patient had a PCN reaction causing immediate rash, facial/tongue/throat swelling, SOB or lightheadedness with hypotension: Unknown Has patient had a PCN reaction causing severe rash involving mucus membranes or skin necrosis: Unknown Has patient had a PCN reaction that required hospitalization: Unknown Has patient had a PCN reaction occurring within the last 10 years: Unknown If all of the above answers are "NO", then may proceed with Cephalosporin use.   Phenytoin Sodium Extended Other (See Comments)   Other reaction(s): Other (See Comments)   Phenytoin Sodium Extended Other (See Comments)   Other reaction(s): Other (See Comments)   Sulfa Antibiotics Rash, Other (See Comments)   unknown        Medication List     STOP taking these medications    albuterol 108 (90 Base) MCG/ACT inhaler Commonly known as: VENTOLIN HFA   alendronate 70 MG tablet Commonly  known as: FOSAMAX   bisacodyl 10 MG suppository Commonly known as: DULCOLAX   cefTRIAXone 1 g injection Commonly known as: ROCEPHIN   cilostazol 50 MG tablet Commonly known as: PLETAL   cloNIDine 0.1 MG tablet Commonly  known as: CATAPRES   donepezil 10 MG disintegrating tablet Commonly known as: ARICEPT ODT   ipratropium-albuterol 0.5-2.5 (3) MG/3ML Soln Commonly known as: DUONEB   memantine 28 MG Cp24 24 hr capsule Commonly known as: Namenda XR   metoCLOPramide 10 MG tablet Commonly known as: REGLAN   simvastatin 10 MG tablet Commonly known as: ZOCOR   sodium chloride 1 g tablet       TAKE these medications    Accu-Chek Aviva Plus test strip Generic drug: glucose blood CHECK BLOOD SUGAR TWICE A WEEK   Acidophilus Lactobacillus Caps Place 175 mg into feeding tube daily.   aspirin 81 MG chewable tablet Place 81 mg into feeding tube daily.   B-D SINGLE USE SWABS REGULAR Pads USE AS DIRECTED (CHECK BLOOD SUGAR TWICE A WEEK)   calcium-vitamin D 500-200 MG-UNIT tablet Commonly known as: OSCAL WITH D Take 1 tablet by mouth 2 (two) times daily.   cholecalciferol 10 MCG (400 UNIT) Tabs tablet Commonly known as: VITAMIN D3 TAKE ONE TABLET BY MOUTH 2 TIMES A DAY   desmopressin 0.2 MG tablet Commonly known as: DDAVP Take 1 tablet (200 mcg total) by mouth at bedtime.   doxycycline 100 MG tablet Commonly known as: ADOXA Take 1 tablet (100 mg total) by mouth 2 (two) times daily for 4 days.   feeding supplement (OSMOLITE 1.5 CAL) Liqd Place 237 mLs into feeding tube 5 (five) times daily.   ferrous sulfate 220 (44 Fe) MG/5ML solution Take 330 mg by mouth daily.   FLUoxetine 20 MG/5ML solution Commonly known as: PROZAC Place 5 mg into feeding tube daily.   free water Soln Place 100 mLs into feeding tube every 4 (four) hours.   magnesium oxide 400 MG tablet Commonly known as: MAG-OX Place 400 mg into feeding tube daily.   metFORMIN 500 MG tablet Commonly known as: GLUCOPHAGE Place 500 mg into feeding tube daily.   pantoprazole sodium 40 mg Commonly known as: PROTONIX Place 40 mg into feeding tube 2 (two) times daily.   polyethylene glycol powder 17 GM/SCOOP  powder Commonly known as: GLYCOLAX/MIRALAX Take 17 g by mouth daily. Prn constipation   Probiotic-10 Caps Take 1 capsule by mouth daily.   ramipril 10 MG capsule Commonly known as: ALTACE TAKE 1 CAPSULE BY MOUTH EVERY DAY What changed:  how much to take how to take this   scopolamine 1 MG/3DAYS Commonly known as: TRANSDERM-SCOP Place 1 patch onto the skin every 3 (three) days.   senna-docusate 8.6-50 MG tablet Commonly known as: Senokot-S Take 2 tablets by mouth 2 (two) times daily.        Contact information for follow-up providers     [facility] Follow up in 2 week(s).   Specialty: Urology Why: Right staghorn calculus Contact information: [address], [place], [place] Follow up in 2 week(s).   Specialty: Gastroenterology Contact information: [place] [address] [phone]              Contact information for after-discharge care     Destination     [facility] .   Service: Skilled Nursing Contact  information: [address] [place] [phone]                    32 minutes Signed: Sharen Hones 12/16/2020, 9:58 AM

## 2020-12-16 NOTE — TOC Transition Note (Signed)
Transition of Care [facility]) - CM/SW Discharge Note   Patient Details  Name: [name] [name] MRN: [id number] Date of Birth: 10-07-1960  [facility]) CM/SW Contact:  Beverly Sessions, RN Phone Number: 12/16/2020, 1:13 PM   Clinical Narrative:    Patient will DC to:[facility] Anticipated DC date:12/16/20  Family notified:Sister Transport by:EMS  Per MD patient ready for DC to . RN, patient, patient's family, and facility notified of DC. Discharge Summary sent to facility. RN given number for report. DC packet on chart. Ambulance transport requested for patient.  TOC signing off.  Isaias Cowman [facility] [phone]      Barriers to Discharge: Continued Medical Work up   Patient Goals and CMS Choice Patient states their goals for this hospitalization and ongoing recovery are:: Patient not fully oriented.   Choice offered to / list presented to : NA  Discharge Placement                       Discharge Plan and Services     Post Acute Care Choice: Resumption of Svcs/PTA Provider                               Social Determinants of Health (SDOH) Interventions     Readmission Risk Interventions No flowsheet data found.

## 2021-01-01 ENCOUNTER — Encounter: Payer: Self-pay | Admitting: Urology

## 2021-01-01 ENCOUNTER — Ambulatory Visit (INDEPENDENT_AMBULATORY_CARE_PROVIDER_SITE_OTHER): Payer: Medicare Other | Admitting: Urology

## 2021-01-01 ENCOUNTER — Other Ambulatory Visit: Payer: Self-pay | Admitting: Urology

## 2021-01-01 ENCOUNTER — Other Ambulatory Visit: Payer: Self-pay

## 2021-01-01 VITALS — BP 116/79 | HR 79 | Wt 167.5 lb

## 2021-01-01 DIAGNOSIS — N200 Calculus of kidney: Secondary | ICD-10-CM

## 2021-01-01 NOTE — Patient Instructions (Signed)
Laser Therapy for Kidney Stones Laser therapy for kidney stones is a procedure to break up small, hard mineral deposits that form in the kidney (kidney stones). The procedure is done using a device that produces a focused beam of light (laser). The laser breaks up kidney stones into pieces that are small enough to be passed out of the body through urination or removed from the body during the procedure. You may need laser therapy if you have kidney stones that are painful or block your urinary tract. This procedure is done by inserting a tube (ureteroscope) into your kidney through the urethral opening. The urethra is the part of the body that drains urine from the bladder. In women, the urethra opens above the vaginal opening. In men, the urethra opens at the tip of the penis. The ureteroscope is inserted through the urethra, and surgical instruments are moved through the bladder and the muscular tube that connects the kidney to the bladder (ureter) until they reach the kidney. Tell a health care provider about: Any allergies you have. All medicines you are taking, including vitamins, herbs, eye drops, creams, and over-the-counter medicines. Any problems you or family members have had with anesthetic medicines. Any blood disorders you have. Any surgeries you have had. Any medical conditions you have. Whether you are pregnant or may be pregnant. What are the risks? Generally, this is a safe procedure. However, problems may occur, including: Infection. Bleeding. Allergic reactions to medicines. Damage to the urethra, bladder, or ureter. Urinary tract infection (UTI). Narrowing of the urethra (urethral stricture). Difficulty passing urine. Blockage of the kidney caused by a fragment of kidney stone. What happens before the procedure? Medicines Ask your health care provider about: Changing or stopping your regular medicines. This is especially important if you are taking diabetes medicines or  blood thinners. Taking medicines such as aspirin and ibuprofen. These medicines can thin your blood. Do not take these medicines unless your health care provider tells you to take them. Taking over-the-counter medicines, vitamins, herbs, and supplements. Eating and drinking Follow instructions from your health care provider about eating and drinking, which may include: 8 hours before the procedure - stop eating heavy meals or foods, such as meat, fried foods, or fatty foods. 6 hours before the procedure - stop eating light meals or foods, such as toast or cereal. 6 hours before the procedure - stop drinking milk or drinks that contain milk. 2 hours before the procedure - stop drinking clear liquids. Staying hydrated Follow instructions from your health care provider about hydration, which may include: Up to 2 hours before the procedure - you may continue to drink clear liquids, such as water, clear fruit juice, black coffee, and plain tea.  General instructions You may have a physical exam before the procedure. You may also have tests, such as imaging tests and blood or urine tests. If your ureter is too narrow, your health care provider may place a soft, flexible tube (stent) inside of it. The stent may be placed days or weeks before your laser therapy procedure. Plan to have someone take you home from the hospital or clinic. If you will be going home right after the procedure, plan to have someone stay with you for 24 hours. Do not use any products that contain nicotine or tobacco for at least 4 weeks before the procedure. These products include cigarettes, e-cigarettes, and chewing tobacco. If you need help quitting, ask your health care provider. Ask your health care provider: How your   surgical site will be marked or identified. What steps will be taken to help prevent infection. These may include: Removing hair at the surgery site. Washing skin with a germ-killing soap. Taking antibiotic  medicine. What happens during the procedure?  An IV will be inserted into one of your veins. You will be given one or more of the following: A medicine to help you relax (sedative). A medicine to numb the area (local anesthetic). A medicine to make you fall asleep (general anesthetic). A ureteroscope will be inserted into your urethra. The ureteroscope will send images to a video screen in the operating room to guide your surgeon to the area of your kidney that will be treated. A small, flexible tube will be threaded through the ureteroscope and into your bladder and ureter, up to your kidney. The laser device will be inserted into your kidney through the tube. Your surgeon will pulse the laser on and off to break up kidney stones. A surgical instrument that has a tiny wire basket may be inserted through the tube into your kidney to remove the pieces of broken kidney stone. The procedure may vary among health care providers and hospitals. What happens after the procedure? Your blood pressure, heart rate, breathing rate, and blood oxygen level will be monitored until you leave the hospital or clinic. You will be given pain medicine as needed. You may continue to receive antibiotics. You may have a stent temporarily placed in your ureter. Do not drive for 24 hours if you were given a sedative during your procedure. You may be given a strainer to collect any stone fragments that you pass in your urine. Your health care provider may have these tested. Summary Laser therapy for kidney stones is a procedure to break up kidney stones into pieces that are small enough to be passed out of the body through urination or removed during the procedure. Follow instructions from your health care provider about eating and drinking before the procedure. During the procedure, the ureteroscope will send images to a video screen to guide your surgeon to the area of your kidney that will be treated. Do not drive  for 24 hours if you were given a sedative during your procedure. This information is not intended to replace advice given to you by your health care provider. Make sure you discuss any questions you have with your health care provider. Document Revised: 09/16/2020 Document Reviewed: 09/16/2020 Elsevier Patient Education  [place].

## 2021-01-01 NOTE — Progress Notes (Unsigned)
Surgical Physician Order Form [facility] [place]  * Scheduling expectation : Next Available  *Length of Case: 2 hours  *Clearance needed: no  *Anticoagulation Instructions: May continue all anticoagulants  *Aspirin Instructions: Ok to continue Aspirin  *Post-op visit Date/Instructions: TBD  *Diagnosis: Right Nephrolithiasis  *Procedure: right Ureteroscopy w/laser lithotripsy & stent placement (09381)   Additional orders: N/A  -Admit type: OUTpatient  -Anesthesia: General  -VTE Prophylaxis Standing Order SCD's       Other:   -Standing Lab Orders Per Anesthesia    Lab other: UA&Urine Culture  -Standing Test orders EKG/Chest x-ray per Anesthesia       Test other:   - Medications: Cipro 400 mg IV and vancomycin 1 g IV  -Other orders:  N/A
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
## 2021-01-13 ENCOUNTER — Emergency Department: Payer: Medicare Other

## 2021-01-13 ENCOUNTER — Encounter: Payer: Self-pay | Admitting: Pharmacy Technician

## 2021-01-13 ENCOUNTER — Inpatient Hospital Stay
Admission: EM | Admit: 2021-01-13 | Discharge: 2021-01-18 | DRG: 871 | Disposition: A | Discharge status: Hospice | Payer: Medicare Other | Source: Skilled Nursing Facility | Attending: Internal Medicine | Admitting: Internal Medicine

## 2021-01-13 ENCOUNTER — Other Ambulatory Visit: Payer: Self-pay

## 2021-01-13 DIAGNOSIS — R471 Dysarthria and anarthria: Secondary | ICD-10-CM | POA: Diagnosis present

## 2021-01-13 DIAGNOSIS — G8250 Quadriplegia, unspecified: Secondary | ICD-10-CM | POA: Diagnosis not present

## 2021-01-13 DIAGNOSIS — Z66 Do not resuscitate: Secondary | ICD-10-CM | POA: Diagnosis present

## 2021-01-13 DIAGNOSIS — R1110 Vomiting, unspecified: Secondary | ICD-10-CM | POA: Diagnosis not present

## 2021-01-13 DIAGNOSIS — N200 Calculus of kidney: Secondary | ICD-10-CM | POA: Diagnosis present

## 2021-01-13 DIAGNOSIS — J189 Pneumonia, unspecified organism: Secondary | ICD-10-CM

## 2021-01-13 DIAGNOSIS — Z888 Allergy status to other drugs, medicaments and biological substances status: Secondary | ICD-10-CM

## 2021-01-13 DIAGNOSIS — K567 Ileus, unspecified: Secondary | ICD-10-CM | POA: Diagnosis present

## 2021-01-13 DIAGNOSIS — F32A Depression, unspecified: Secondary | ICD-10-CM | POA: Diagnosis present

## 2021-01-13 DIAGNOSIS — Z7189 Other specified counseling: Secondary | ICD-10-CM | POA: Diagnosis not present

## 2021-01-13 DIAGNOSIS — Z87891 Personal history of nicotine dependence: Secondary | ICD-10-CM | POA: Diagnosis not present

## 2021-01-13 DIAGNOSIS — E785 Hyperlipidemia, unspecified: Secondary | ICD-10-CM | POA: Diagnosis present

## 2021-01-13 DIAGNOSIS — Z515 Encounter for palliative care: Secondary | ICD-10-CM

## 2021-01-13 DIAGNOSIS — K310 Acute dilatation of stomach: Secondary | ICD-10-CM | POA: Diagnosis not present

## 2021-01-13 DIAGNOSIS — F0393 Unspecified dementia, unspecified severity, with mood disturbance: Secondary | ICD-10-CM | POA: Diagnosis present

## 2021-01-13 DIAGNOSIS — I10 Essential (primary) hypertension: Secondary | ICD-10-CM | POA: Diagnosis present

## 2021-01-13 DIAGNOSIS — J690 Pneumonitis due to inhalation of food and vomit: Secondary | ICD-10-CM | POA: Diagnosis present

## 2021-01-13 DIAGNOSIS — E1151 Type 2 diabetes mellitus with diabetic peripheral angiopathy without gangrene: Secondary | ICD-10-CM | POA: Diagnosis present

## 2021-01-13 DIAGNOSIS — Z882 Allergy status to sulfonamides status: Secondary | ICD-10-CM

## 2021-01-13 DIAGNOSIS — K5909 Other constipation: Secondary | ICD-10-CM | POA: Diagnosis present

## 2021-01-13 DIAGNOSIS — Z87820 Personal history of traumatic brain injury: Secondary | ICD-10-CM

## 2021-01-13 DIAGNOSIS — S069XAA Unspecified intracranial injury with loss of consciousness status unknown, initial encounter: Secondary | ICD-10-CM | POA: Diagnosis present

## 2021-01-13 DIAGNOSIS — R4189 Other symptoms and signs involving cognitive functions and awareness: Secondary | ICD-10-CM | POA: Diagnosis present

## 2021-01-13 DIAGNOSIS — J9601 Acute respiratory failure with hypoxia: Secondary | ICD-10-CM | POA: Diagnosis present

## 2021-01-13 DIAGNOSIS — M810 Age-related osteoporosis without current pathological fracture: Secondary | ICD-10-CM | POA: Diagnosis present

## 2021-01-13 DIAGNOSIS — E11649 Type 2 diabetes mellitus with hypoglycemia without coma: Secondary | ICD-10-CM | POA: Diagnosis present

## 2021-01-13 DIAGNOSIS — Z931 Gastrostomy status: Secondary | ICD-10-CM | POA: Diagnosis not present

## 2021-01-13 DIAGNOSIS — A419 Sepsis, unspecified organism: Secondary | ICD-10-CM | POA: Diagnosis present

## 2021-01-13 DIAGNOSIS — E119 Type 2 diabetes mellitus without complications: Secondary | ICD-10-CM

## 2021-01-13 DIAGNOSIS — Z833 Family history of diabetes mellitus: Secondary | ICD-10-CM

## 2021-01-13 DIAGNOSIS — J9602 Acute respiratory failure with hypercapnia: Secondary | ICD-10-CM | POA: Diagnosis present

## 2021-01-13 DIAGNOSIS — Z7984 Long term (current) use of oral hypoglycemic drugs: Secondary | ICD-10-CM

## 2021-01-13 DIAGNOSIS — Z79899 Other long term (current) drug therapy: Secondary | ICD-10-CM

## 2021-01-13 DIAGNOSIS — N390 Urinary tract infection, site not specified: Secondary | ICD-10-CM | POA: Diagnosis present

## 2021-01-13 DIAGNOSIS — R6520 Severe sepsis without septic shock: Secondary | ICD-10-CM | POA: Diagnosis present

## 2021-01-13 DIAGNOSIS — R627 Adult failure to thrive: Secondary | ICD-10-CM | POA: Diagnosis present

## 2021-01-13 DIAGNOSIS — I739 Peripheral vascular disease, unspecified: Secondary | ICD-10-CM | POA: Diagnosis present

## 2021-01-13 DIAGNOSIS — R532 Functional quadriplegia: Secondary | ICD-10-CM | POA: Diagnosis present

## 2021-01-13 DIAGNOSIS — Z7982 Long term (current) use of aspirin: Secondary | ICD-10-CM

## 2021-01-13 DIAGNOSIS — Z20822 Contact with and (suspected) exposure to covid-19: Secondary | ICD-10-CM | POA: Diagnosis present

## 2021-01-13 DIAGNOSIS — Z800 Family history of malignant neoplasm of digestive organs: Secondary | ICD-10-CM

## 2021-01-13 DIAGNOSIS — Z880 Allergy status to penicillin: Secondary | ICD-10-CM

## 2021-01-13 LAB — CBC WITH DIFFERENTIAL/PLATELET
Abs Immature Granulocytes: 0.12 10*3/uL — ABNORMAL HIGH (ref 0.00–0.07)
Basophils Absolute: 0.1 10*3/uL (ref 0.0–0.1)
Basophils Relative: 0 %
Eosinophils Absolute: 0 10*3/uL (ref 0.0–0.5)
Eosinophils Relative: 0 %
HCT: 39.7 % (ref 36.0–46.0)
Hemoglobin: 11.9 g/dL — ABNORMAL LOW (ref 12.0–15.0)
Immature Granulocytes: 1 %
Lymphocytes Relative: 5 %
Lymphs Abs: 1.1 10*3/uL (ref 0.7–4.0)
MCH: 28.3 pg (ref 26.0–34.0)
MCHC: 30 g/dL (ref 30.0–36.0)
MCV: 94.5 fL (ref 80.0–100.0)
Monocytes Absolute: 1.5 10*3/uL — ABNORMAL HIGH (ref 0.1–1.0)
Monocytes Relative: 7 %
Neutro Abs: 19.6 10*3/uL — ABNORMAL HIGH (ref 1.7–7.7)
Neutrophils Relative %: 87 %
Platelets: 328 10*3/uL (ref 150–400)
RBC: 4.2 MIL/uL (ref 3.87–5.11)
RDW: 15.4 % (ref 11.5–15.5)
WBC: 22.5 10*3/uL — ABNORMAL HIGH (ref 4.0–10.5)
nRBC: 0 % (ref 0.0–0.2)

## 2021-01-13 LAB — URINALYSIS, COMPLETE (UACMP) WITH MICROSCOPIC
Bilirubin Urine: NEGATIVE
Glucose, UA: >=500 mg/dL — AB
Hgb urine dipstick: NEGATIVE
Ketones, ur: NEGATIVE mg/dL
Nitrite: NEGATIVE
Protein, ur: NEGATIVE mg/dL
RBC / HPF: >50 RBC/hpf — ABNORMAL HIGH (ref 0–5)
Specific Gravity, Urine: 1.038 — ABNORMAL HIGH (ref 1.005–1.030)
WBC, UA: >50 WBC/hpf — ABNORMAL HIGH (ref 0–5)
pH: 6 (ref 5.0–8.0)

## 2021-01-13 LAB — COMPREHENSIVE METABOLIC PANEL
ALT: 14 U/L (ref 0–44)
AST: 17 U/L (ref 15–41)
Albumin: 3.2 g/dL — ABNORMAL LOW (ref 3.5–5.0)
Alkaline Phosphatase: 97 U/L (ref 38–126)
Anion gap: 7 (ref 5–15)
BUN: 28 mg/dL — ABNORMAL HIGH (ref 8–23)
CO2: 29 mmol/L (ref 22–32)
Calcium: 9.3 mg/dL (ref 8.9–10.3)
Chloride: 103 mmol/L (ref 98–111)
Creatinine, Ser: 0.63 mg/dL (ref 0.44–1.00)
GFR, Estimated: >60 mL/min (ref >60)
Glucose, Bld: 279 mg/dL — ABNORMAL HIGH (ref 70–99)
Potassium: 4.4 mmol/L (ref 3.5–5.1)
Sodium: 139 mmol/L (ref 135–145)
Total Bilirubin: 0.4 mg/dL (ref 0.3–1.2)
Total Protein: 7.5 g/dL (ref 6.5–8.1)

## 2021-01-13 LAB — URINALYSIS, ROUTINE W REFLEX MICROSCOPIC
Bacteria, UA: NONE SEEN
Bilirubin Urine: NEGATIVE
Glucose, UA: >=500 mg/dL — AB
Hgb urine dipstick: NEGATIVE
Ketones, ur: NEGATIVE mg/dL
Nitrite: NEGATIVE
Protein, ur: NEGATIVE mg/dL
Specific Gravity, Urine: 1.042 — ABNORMAL HIGH (ref 1.005–1.030)
pH: 6 (ref 5.0–8.0)

## 2021-01-13 LAB — RESP PANEL BY RT-PCR (FLU A&B, COVID) ARPGX2
Influenza A by PCR: NEGATIVE
Influenza B by PCR: NEGATIVE
SARS Coronavirus 2 by RT PCR: NEGATIVE

## 2021-01-13 LAB — LACTIC ACID, PLASMA
Lactic Acid, Venous: 1.8 mmol/L (ref 0.5–1.9)
Lactic Acid, Venous: 2.1 mmol/L (ref 0.5–1.9)
Lactic Acid, Venous: 1.6 mmol/L (ref 0.5–1.9)

## 2021-01-13 LAB — TROPONIN I (HIGH SENSITIVITY)
Troponin I (High Sensitivity): 12 ng/L (ref <18)
Troponin I (High Sensitivity): 11 ng/L (ref <18)

## 2021-01-13 LAB — BRAIN NATRIURETIC PEPTIDE: B Natriuretic Peptide: 53 pg/mL (ref 0.0–100.0)

## 2021-01-13 LAB — PROTIME-INR
INR: 1 (ref 0.8–1.2)
Prothrombin Time: 12.8 seconds (ref 11.4–15.2)

## 2021-01-13 LAB — LIPASE, BLOOD: Lipase: 26 U/L (ref 11–51)

## 2021-01-13 LAB — APTT: aPTT: 30 seconds (ref 24–36)

## 2021-01-13 MED ORDER — POLYETHYLENE GLYCOL 3350 17 G PO PACK: 17.0000 g | PACK | Freq: Every day | ORAL | Status: DC | PRN

## 2021-01-13 MED ORDER — SODIUM CHLORIDE 0.9 % IV SOLN
2.0000 g | Freq: Once | INTRAVENOUS | Status: DC
  Filled 2021-01-13: qty 2

## 2021-01-13 MED ORDER — SODIUM CHLORIDE 0.9 % IV BOLUS (SEPSIS)
500.0000 mL | Freq: Once | INTRAVENOUS | Status: AC
  Administered 2021-01-13: 17:00:00 500 mL via INTRAVENOUS

## 2021-01-13 MED ORDER — VANCOMYCIN HCL IN DEXTROSE 1-5 GM/200ML-% IV SOLN: 1000.0000 mg | Freq: Once | INTRAVENOUS | Status: DC

## 2021-01-13 MED ORDER — CEFEPIME HCL 2 G IJ SOLR
2.0000 g | Freq: Once | INTRAMUSCULAR | Status: AC
  Administered 2021-01-13: 18:00:00 2 g via INTRAVENOUS
  Filled 2021-01-13: qty 2

## 2021-01-13 MED ORDER — METRONIDAZOLE 500 MG/100ML IV SOLN
500.0000 mg | Freq: Two times a day (BID) | INTRAVENOUS | Status: DC
  Administered 2021-01-13 – 2021-01-14 (×2): 500 mg via INTRAVENOUS
  Filled 2021-01-13 (×3): qty 100

## 2021-01-13 MED ORDER — ONDANSETRON HCL 4 MG/2ML IJ SOLN
4.0000 mg | INTRAMUSCULAR | Status: AC
  Administered 2021-01-13: 16:00:00 4 mg via INTRAVENOUS
  Filled 2021-01-13: qty 2

## 2021-01-13 MED ORDER — IOHEXOL 350 MG/ML SOLN
75.0000 mL | Freq: Once | INTRAVENOUS | Status: AC | PRN
  Administered 2021-01-13: 17:00:00 75 mL via INTRAVENOUS

## 2021-01-13 MED ORDER — VANCOMYCIN HCL 1500 MG/300ML IV SOLN
1500.0000 mg | Freq: Once | INTRAVENOUS | Status: AC
  Administered 2021-01-13: 19:00:00 1500 mg via INTRAVENOUS
  Filled 2021-01-13: qty 300

## 2021-01-13 MED ORDER — LACTATED RINGERS IV SOLN: INTRAVENOUS | Status: AC

## 2021-01-13 MED ORDER — SODIUM CHLORIDE 0.9 % IV BOLUS
500.0000 mL | Freq: Once | INTRAVENOUS | Status: AC
  Administered 2021-01-13: 23:00:00 500 mL via INTRAVENOUS

## 2021-01-13 MED ORDER — SODIUM CHLORIDE 0.9 % IV SOLN
2.0000 g | INTRAVENOUS | Status: DC
Start: 2021-01-14 — End: 2021-01-14
  Administered 2021-01-14: 06:00:00 2 g via INTRAVENOUS
  Filled 2021-01-13: qty 2

## 2021-01-13 MED ORDER — SODIUM CHLORIDE 0.9 % IV SOLN: 2.0000 g | Freq: Three times a day (TID) | INTRAVENOUS | Status: DC

## 2021-01-13 MED ORDER — VANCOMYCIN HCL 1500 MG/300ML IV SOLN: 1500.0000 mg | INTRAVENOUS | Status: DC

## 2021-01-13 MED ORDER — LACTATED RINGERS IV BOLUS
500.0000 mL | Freq: Once | INTRAVENOUS | Status: AC
  Administered 2021-01-13: 16:00:00 500 mL via INTRAVENOUS

## 2021-01-13 MED ORDER — VANCOMYCIN HCL 1500 MG/300ML IV SOLN
1500.0000 mg | INTRAVENOUS | Status: DC
Start: 2021-01-14 — End: 2021-01-14
  Filled 2021-01-13: qty 300

## 2021-01-13 NOTE — ED Notes (Signed)
Pharmacy notified of need to tube abx to ED.

## 2021-01-13 NOTE — Consult Note (Signed)
CODE SEPSIS - PHARMACY COMMUNICATION  **Broad Spectrum Antibiotics should be administered within 1 hour of Sepsis diagnosis**  Time Code Sepsis Called/Page Received: no call or page (consult entered at 1622)  Antibiotics Ordered: Cefepime and Vancomycin  Time of 1st antibiotic administration: 1730  Additional action taken by pharmacy: aztreonam not available at ED pyxis, RN contacted pharmacy and order changed to cefepime per consult instructions  If necessary, Name of Provider/Nurse Contacted: Gwenith Daily RN  Derric Dealmeida Rodriguez-Guzman PharmD, BCPS 01/13/2021 4:47 PM

## 2021-01-13 NOTE — ED Triage Notes (Signed)
Pt bib ems from [facility]. Staff reports pt has been sick for "a while". Today pt with emesis X2 and after 2nd episode pt oxygen saturation 88%. On scene pt with saturations of 93%, placed on 3L [place] with improvement to 98%. Pt recently admitted for sepsis.  130SinusTach 99.75F

## 2021-01-13 NOTE — Progress Notes (Signed)
Notified provider and bedside nurse of need to order and draw repeat lactic acid #3.  
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
## 2021-01-13 NOTE — Consult Note (Addendum)
PHARMACY -  BRIEF ANTIBIOTIC NOTE   Pharmacy has received consult(s) for Aztreonam/cefepime and Vancomycin from an ED provider.  The patient's profile has been reviewed for ht/wt/allergies/indication/available labs.    One time order(s) placed for cefepime and Vancomycin  Further antibiotics/pharmacy consults should be ordered by admitting physician if indicated.                       Thank you, Jacoba Cherney Rodriguez-Guzman PharmD, BCPS 01/13/2021 4:44 PM
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
## 2021-01-13 NOTE — Consult Note (Deleted)
Pharmacy Antibiotic Note  [name] [name] is a 60 y.o. female admitted on 01/13/2021 with sepsis.  Pharmacy has been consulted for cefepime and vancomcyin dosing. Hx of enterococcus, staph sp (mecillin residence), ecoli, in the urine.   Plan: Cefepime 2 g q8H   Pt will received vancomycin 1500 mg x 1 loading followed by 1500 mg q24H with a predicted AUC of 483. Goal AUC 400-600. Vd 0.72, Scr 0.8. Obtain vancomycin level after 4th or 5th dose.      Temp (24hrs), Avg:99.9 F (37.7 C), Min:99.8 F (37.7 C), Max:99.9 F (37.7 C)  Recent Labs  Lab 01/13/21 1527  WBC 22.5*  CREATININE 0.63  LATICACIDVEN 1.8    Estimated Creatinine Clearance: 72.7 mL/min (by C-G formula based on SCr of 0.63 mg/dL).    Allergies  Allergen Reactions   Dilantin [Phenytoin] Other (See Comments)    unknown   Limonene    Penicillins Other (See Comments)    .Has patient had a PCN reaction causing immediate rash, facial/tongue/throat swelling, SOB or lightheadedness with hypotension: Unknown Has patient had a PCN reaction causing severe rash involving mucus membranes or skin necrosis: Unknown Has patient had a PCN reaction that required hospitalization: Unknown Has patient had a PCN reaction occurring within the last 10 years: Unknown If all of the above answers are "NO", then may proceed with Cephalosporin use.    Phenytoin Sodium Extended Other (See Comments)    Other reaction(s): Other (See Comments)   Phenytoin Sodium Extended Other (See Comments)    Other reaction(s): Other (See Comments)   Sulfa Antibiotics Rash and Other (See Comments)    unknown    Antimicrobials this admission: 12/19 cefepime >>  12/19 vancomycin >>   Dose adjustments this admission: none  Microbiology results: 12/19 BCx: pending 12/19  UCx: pending   Thank you for allowing pharmacy to be a part of this patients care.  Oswald Hillock 01/13/2021 6:02 PM

## 2021-01-13 NOTE — ED Notes (Addendum)
Upon returning from CT pt oxygen saturation of 80% on 5L [place]. Pt placed on NRB at 15L with improvement to 93%.

## 2021-01-13 NOTE — Consult Note (Signed)
Pharmacy Antibiotic Note  [name] [name] is a 60 y.o. female admitted on 01/13/2021 with sepsis.  Pharmacy has been consulted for cefepime and vancomcyin dosing. Hx of enterococcus, staph sp (mecillin residence), ecoli, in the urine. Concern for an intra-abdominal infection.   Plan: On ceftriaxone + flagyl.   Pt will received vancomycin 1500 mg x 1 loading followed by 1500 mg q24H with a predicted AUC of 483. Goal AUC 400-600. Vd 0.72, Scr 0.8. Obtain vancomycin level after 4th or 5th dose.      Temp (24hrs), Avg:99.9 F (37.7 C), Min:99.8 F (37.7 C), Max:99.9 F (37.7 C)  Recent Labs  Lab 01/13/21 1527 01/13/21 1735  WBC 22.5*  --   CREATININE 0.63  --   LATICACIDVEN 1.8 2.1*     Estimated Creatinine Clearance: 72.7 mL/min (by C-G formula based on SCr of 0.63 mg/dL).    Allergies  Allergen Reactions   Dilantin [Phenytoin] Other (See Comments)    unknown   Limonene    Penicillins Other (See Comments)    .Has patient had a PCN reaction causing immediate rash, facial/tongue/throat swelling, SOB or lightheadedness with hypotension: Unknown Has patient had a PCN reaction causing severe rash involving mucus membranes or skin necrosis: Unknown Has patient had a PCN reaction that required hospitalization: Unknown Has patient had a PCN reaction occurring within the last 10 years: Unknown If all of the above answers are "NO", then may proceed with Cephalosporin use.    Phenytoin Sodium Extended Other (See Comments)    Other reaction(s): Other (See Comments)   Phenytoin Sodium Extended Other (See Comments)    Other reaction(s): Other (See Comments)   Sulfa Antibiotics Rash and Other (See Comments)    unknown    Antimicrobials this admission: 12/19 cefepime >> 12/19 12/19 vancomycin >>  12/19 ceftriaxone >>  12/19 flagyl >>   Dose adjustments this admission: none  Microbiology results: 12/19 BCx: pending 12/19  UCx: pending   Thank you for allowing pharmacy to be a  part of this patients care.  Oswald Hillock 01/13/2021 7:02 PM

## 2021-01-13 NOTE — Progress Notes (Signed)
Elink following code sepsis °

## 2021-01-14 ENCOUNTER — Encounter: Payer: Self-pay | Admitting: Pulmonary Disease

## 2021-01-14 ENCOUNTER — Inpatient Hospital Stay: Payer: Medicare Other

## 2021-01-14 DIAGNOSIS — R1110 Vomiting, unspecified: Secondary | ICD-10-CM

## 2021-01-14 DIAGNOSIS — K567 Ileus, unspecified: Secondary | ICD-10-CM

## 2021-01-14 DIAGNOSIS — K310 Acute dilatation of stomach: Secondary | ICD-10-CM

## 2021-01-14 LAB — CBC WITH DIFFERENTIAL/PLATELET
Abs Immature Granulocytes: 0.07 10*3/uL (ref 0.00–0.07)
Basophils Absolute: 0.1 10*3/uL (ref 0.0–0.1)
Basophils Relative: 1 %
Eosinophils Absolute: 0.1 10*3/uL (ref 0.0–0.5)
Eosinophils Relative: 1 %
HCT: 29.5 % — ABNORMAL LOW (ref 36.0–46.0)
Hemoglobin: 9 g/dL — ABNORMAL LOW (ref 12.0–15.0)
Immature Granulocytes: 1 %
Lymphocytes Relative: 16 %
Lymphs Abs: 2 10*3/uL (ref 0.7–4.0)
MCH: 28.7 pg (ref 26.0–34.0)
MCHC: 30.5 g/dL (ref 30.0–36.0)
MCV: 93.9 fL (ref 80.0–100.0)
Monocytes Absolute: 1.1 10*3/uL — ABNORMAL HIGH (ref 0.1–1.0)
Monocytes Relative: 9 %
Neutro Abs: 9.3 10*3/uL — ABNORMAL HIGH (ref 1.7–7.7)
Neutrophils Relative %: 72 %
Platelets: 226 10*3/uL (ref 150–400)
RBC: 3.14 MIL/uL — ABNORMAL LOW (ref 3.87–5.11)
RDW: 15.5 % (ref 11.5–15.5)
WBC: 12.6 10*3/uL — ABNORMAL HIGH (ref 4.0–10.5)
nRBC: 0 % (ref 0.0–0.2)

## 2021-01-14 LAB — BASIC METABOLIC PANEL
Anion gap: 3 — ABNORMAL LOW (ref 5–15)
BUN: 17 mg/dL (ref 8–23)
CO2: 28 mmol/L (ref 22–32)
Calcium: 8.3 mg/dL — ABNORMAL LOW (ref 8.9–10.3)
Chloride: 109 mmol/L (ref 98–111)
Creatinine, Ser: 0.54 mg/dL (ref 0.44–1.00)
GFR, Estimated: >60 mL/min (ref >60)
Glucose, Bld: 97 mg/dL (ref 70–99)
Potassium: 3.9 mmol/L (ref 3.5–5.1)
Sodium: 140 mmol/L (ref 135–145)

## 2021-01-14 LAB — MRSA NEXT GEN BY PCR, NASAL: MRSA by PCR Next Gen: NOT DETECTED

## 2021-01-14 LAB — MAGNESIUM: Magnesium: 1.9 mg/dL (ref 1.7–2.4)

## 2021-01-14 LAB — GLUCOSE, CAPILLARY: Glucose-Capillary: 107 mg/dL — ABNORMAL HIGH (ref 70–99)

## 2021-01-14 LAB — URINE CULTURE

## 2021-01-14 LAB — STREP PNEUMONIAE URINARY ANTIGEN: Strep Pneumo Urinary Antigen: NEGATIVE

## 2021-01-14 MED ORDER — CHLORHEXIDINE GLUCONATE 0.12 % MT SOLN
15.0000 mL | Freq: Two times a day (BID) | OROMUCOSAL | Status: DC
  Administered 2021-01-14 – 2021-01-18 (×10): 15 mL via OROMUCOSAL
  Filled 2021-01-14 (×7): qty 15

## 2021-01-14 MED ORDER — LACTATED RINGERS IV BOLUS
500.0000 mL | Freq: Once | INTRAVENOUS | Status: AC
  Administered 2021-01-14: 18:00:00 500 mL via INTRAVENOUS

## 2021-01-14 MED ORDER — CHLORHEXIDINE GLUCONATE CLOTH 2 % EX PADS
6.0000 | MEDICATED_PAD | Freq: Every day | CUTANEOUS | Status: DC
  Administered 2021-01-14 – 2021-01-17 (×4): 6 via TOPICAL

## 2021-01-14 MED ORDER — ORAL CARE MOUTH RINSE
15.0000 mL | Freq: Two times a day (BID) | OROMUCOSAL | Status: DC
  Administered 2021-01-14 – 2021-01-18 (×9): 15 mL via OROMUCOSAL

## 2021-01-14 MED ORDER — PIPERACILLIN-TAZOBACTAM 3.375 G IVPB
3.3750 g | Freq: Three times a day (TID) | INTRAVENOUS | Status: DC
  Administered 2021-01-14 – 2021-01-18 (×12): 3.375 g via INTRAVENOUS
  Filled 2021-01-14 (×12): qty 50

## 2021-01-14 MED ORDER — POLYETHYLENE GLYCOL 3350 17 G PO PACK
17.0000 g | PACK | Freq: Every day | ORAL | Status: DC
  Administered 2021-01-14 – 2021-01-18 (×5): 17 g
  Filled 2021-01-14 (×5): qty 1

## 2021-01-14 MED ORDER — POLYETHYLENE GLYCOL 3350 17 G PO PACK: 17.0000 g | PACK | Freq: Every day | ORAL | Status: DC

## 2021-01-14 MED ORDER — SODIUM CHLORIDE 0.9 % IV SOLN: INTRAVENOUS | Status: DC | PRN
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
## 2021-01-14 NOTE — ED Notes (Signed)
Rufina Falco NP notified of Soft SBP. Normal saline bolus ordered. Benjamine Mola stated that she would come see patient.
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
## 2021-01-14 NOTE — ED Notes (Signed)
Rufina Falco notified of Soft SBP. Repeat BP improved without intervention. Will continue to monitor.

## 2021-01-14 NOTE — Plan of Care (Signed)
New admission to ICU overnight.   Problem: Education: Goal: Knowledge of General Education information will improve Description: Including pain rating scale, medication(s)/side effects and non-pharmacologic comfort measures Outcome: Progressing   Problem: Health Behavior/Discharge Planning: Goal: Ability to manage health-related needs will improve Outcome: Progressing   Problem: Clinical Measurements: Goal: Ability to maintain clinical measurements within normal limits will improve Outcome: Progressing Goal: Will remain free from infection Outcome: Progressing Goal: Diagnostic test results will improve Outcome: Progressing Goal: Respiratory complications will improve Outcome: Progressing Goal: Cardiovascular complication will be avoided Outcome: Progressing   Problem: Activity: Goal: Risk for activity intolerance will decrease Outcome: Progressing   Problem: Nutrition: Goal: Adequate nutrition will be maintained Outcome: Progressing   Problem: Coping: Goal: Level of anxiety will decrease Outcome: Progressing   Problem: Elimination: Goal: Will not experience complications related to bowel motility Outcome: Progressing Goal: Will not experience complications related to urinary retention Outcome: Progressing   Problem: Pain Managment: Goal: General experience of comfort will improve Outcome: Progressing   Problem: Safety: Goal: Ability to remain free from injury will improve Outcome: Progressing   Problem: Skin Integrity: Goal: Risk for impaired skin integrity will decrease Outcome: Progressing

## 2021-01-14 NOTE — Progress Notes (Signed)
GOALS OF CARE DISCUSSION  The Clinical status was relayed to family in detail.  Updated and notified of patients medical condition.   Patient is having a weak cough and struggling to remove secretions.   Patient with increased WOB and using accessory muscles to breathe intermittently Explained to family course of therapy and the modalities  Patient with distended abd full of stool with abd distention with PEG tube in place +aspiration pneumonia with h/o kidney stones Patient seems to have been suffering for a very Abdelrahman time,   Patient with Progressive multiorgan failure with a very high probablity of a very minimal chance of meaningful recovery despite all aggressive and optimal medical therapy.    Family understands the situation.  They have consented and agreed to DNR/DNI  They are open to Palliative care services and may consider Home with Hospice as patient may feel happy going home  Family are satisfied with Plan of action and management. All questions answered  Additional CC time 35 mins   [name] [name] Patricia Pesa, M.D.  [facility]  Medical Director [place] Director [facility] Cardio-Pulmonary Department

## 2021-01-14 NOTE — Progress Notes (Signed)
SLP Cancellation Note  Patient Details Name: [name] [name] MRN: [id number] DOB: 04-14-1960   Cancelled treatment:       Reason Eval/Treat Not Completed: Medical issues which prohibited therapy (chart reviewed; consulted NSG re: pt's status) Pt has a PEG tube for TFs Baseline -- this is Primary, and she had not been taking any po's at the NH per her Nursing staff there(TC) as of admit 11/2020. Prior to that in 2019 post a MBSS, pt had been taking only small tsps of Honey consistency liquids, puree. Recommend frequent oral care for hygiene and stimulation of swallowing.  Recommend continue NPO status w/ full TFs via PEG for nutrition/hydration as was previous POC. Recommend frequent oral care for hygiene and stimulation of swallowing. No further skilled ST services indicated at this time. NSG agreed.      Orinda Kenner, MS, CCC-SLP Speech Language Pathologist Rehab Services [phone] [facility] 01/14/2021, 10:22 AM
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
## 2021-01-15 ENCOUNTER — Encounter: Payer: Self-pay | Admitting: Pulmonary Disease

## 2021-01-15 DIAGNOSIS — J690 Pneumonitis due to inhalation of food and vomit: Secondary | ICD-10-CM | POA: Diagnosis present

## 2021-01-15 DIAGNOSIS — Z515 Encounter for palliative care: Secondary | ICD-10-CM

## 2021-01-15 DIAGNOSIS — Z7189 Other specified counseling: Secondary | ICD-10-CM

## 2021-01-15 DIAGNOSIS — A419 Sepsis, unspecified organism: Principal | ICD-10-CM

## 2021-01-15 LAB — RENAL FUNCTION PANEL
Albumin: 2.4 g/dL — ABNORMAL LOW (ref 3.5–5.0)
Anion gap: 4 — ABNORMAL LOW (ref 5–15)
BUN: 12 mg/dL (ref 8–23)
CO2: 30 mmol/L (ref 22–32)
Calcium: 8.4 mg/dL — ABNORMAL LOW (ref 8.9–10.3)
Chloride: 106 mmol/L (ref 98–111)
Creatinine, Ser: 0.67 mg/dL (ref 0.44–1.00)
GFR, Estimated: >60 mL/min (ref >60)
Glucose, Bld: 69 mg/dL — ABNORMAL LOW (ref 70–99)
Phosphorus: 3.2 mg/dL (ref 2.5–4.6)
Potassium: 3.7 mmol/L (ref 3.5–5.1)
Sodium: 140 mmol/L (ref 135–145)

## 2021-01-15 LAB — CBC
HCT: 26.4 % — ABNORMAL LOW (ref 36.0–46.0)
Hemoglobin: 8.1 g/dL — ABNORMAL LOW (ref 12.0–15.0)
MCH: 28.7 pg (ref 26.0–34.0)
MCHC: 30.7 g/dL (ref 30.0–36.0)
MCV: 93.6 fL (ref 80.0–100.0)
Platelets: 199 10*3/uL (ref 150–400)
RBC: 2.82 MIL/uL — ABNORMAL LOW (ref 3.87–5.11)
RDW: 15.1 % (ref 11.5–15.5)
WBC: 7.4 10*3/uL (ref 4.0–10.5)
nRBC: 0 % (ref 0.0–0.2)

## 2021-01-15 LAB — GLUCOSE, CAPILLARY
Glucose-Capillary: 150 mg/dL — ABNORMAL HIGH (ref 70–99)
Glucose-Capillary: 66 mg/dL — ABNORMAL LOW (ref 70–99)
Glucose-Capillary: 73 mg/dL (ref 70–99)
Glucose-Capillary: 65 mg/dL — ABNORMAL LOW (ref 70–99)
Glucose-Capillary: 134 mg/dL — ABNORMAL HIGH (ref 70–99)
Glucose-Capillary: 110 mg/dL — ABNORMAL HIGH (ref 70–99)
Glucose-Capillary: 80 mg/dL (ref 70–99)

## 2021-01-15 LAB — MAGNESIUM: Magnesium: 1.8 mg/dL (ref 1.7–2.4)

## 2021-01-15 MED ORDER — DEXTROSE 50 % IV SOLN
INTRAVENOUS | Status: AC
  Filled 2021-01-15: qty 50

## 2021-01-15 MED ORDER — PROSOURCE TF PO LIQD
45.0000 mL | Freq: Every day | ORAL | Status: DC
  Administered 2021-01-16 – 2021-01-17 (×2): 45 mL
  Filled 2021-01-15 (×3): qty 45

## 2021-01-15 MED ORDER — DEXTROSE 50 % IV SOLN
12.5000 g | INTRAVENOUS | Status: AC
  Administered 2021-01-15: 17:00:00 12.5 g via INTRAVENOUS
  Filled 2021-01-15: qty 50

## 2021-01-15 MED ORDER — DEXTROSE 50 % IV SOLN
25.0000 mL | INTRAVENOUS | Status: AC
  Administered 2021-01-15: 07:00:00 25 mL via INTRAVENOUS

## 2021-01-15 MED ORDER — FREE WATER
30.0000 mL | Status: DC
  Administered 2021-01-15 – 2021-01-18 (×18): 30 mL

## 2021-01-15 MED ORDER — OSMOLITE 1.5 CAL PO LIQD
1000.0000 mL | ORAL | Status: DC
  Administered 2021-01-15: 17:00:00 1000 mL

## 2021-01-15 MED ORDER — ADULT MULTIVITAMIN LIQUID CH
15.0000 mL | Freq: Every day | ORAL | Status: DC
  Administered 2021-01-17: 15 mL
  Filled 2021-01-15 (×4): qty 15

## 2021-01-15 MED ORDER — FLUOXETINE HCL 20 MG/5ML PO SOLN
5.0000 mg | Freq: Every day | ORAL | Status: DC
  Administered 2021-01-16 – 2021-01-17 (×2): 5 mg
  Filled 2021-01-15 (×2): qty 5
  Filled 2021-01-15: qty 1.25

## 2021-01-15 MED ORDER — INSULIN ASPART 100 UNIT/ML IJ SOLN
0.0000 [IU] | INTRAMUSCULAR | Status: DC
  Administered 2021-01-16 (×2): 1 [IU] via SUBCUTANEOUS
  Administered 2021-01-16: 13:00:00 2 [IU] via SUBCUTANEOUS
  Administered 2021-01-16 – 2021-01-17 (×3): 1 [IU] via SUBCUTANEOUS
  Administered 2021-01-17: 09:00:00 3 [IU] via SUBCUTANEOUS
  Administered 2021-01-17 – 2021-01-18 (×2): 2 [IU] via SUBCUTANEOUS
  Administered 2021-01-18: 1 [IU] via SUBCUTANEOUS
  Filled 2021-01-15 (×10): qty 1
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
## 2021-01-15 NOTE — Progress Notes (Signed)
Hypoglycemic Event  CBG: 65   Treatment: D50 25 mL (12.5 gm)  Symptoms: None  Follow-up CBG: Time:  1737 CBG Result:134  Possible Reasons for Event: Inadequate meal intake   [name] [name] A

## 2021-01-15 NOTE — Progress Notes (Signed)
Patient's pulse ox dropped in the 80s on 2 listers nasal cannula. Patient was asleep. Repositioned and performed chest physiotherapy via bed. The patient has been placed on 10 liters HFNC by respiratory therapy.

## 2021-01-15 NOTE — Progress Notes (Incomplete)
Hypoglycemic Event  CBG:66  Treatment: D50 25 mL (12.5 gm)  Symptoms: None  Follow-up CBG: Time: CBG Result:***  Possible Reasons for Event: Inadequate meal intake  Comments/MD notified:na    Joanne Gavel

## 2021-01-15 NOTE — TOC Initial Note (Signed)
Transition of Care [facility]) - Initial/Assessment Note    Patient Details  Name: [name] [name] MRN: [id number] Date of Birth: Aug 15, 1960  Transition of Care [facility]) CM/SW Contact:    Shelbie Hutching, RN Phone Number: 01/15/2021, 4:42 PM  Clinical Narrative:                 Patient is from [facility] term [facility].  Palliative has been consulted for goals of care with the family.  Family is considering hospice services, either at [facility] or at the hospice home.   TOC will follow.   Expected Discharge Plan: [place] (with hospice) Barriers to Discharge: Continued Medical Work up   Patient Goals and CMS Choice Patient states their goals for this hospitalization and ongoing recovery are:: Family thinking about hospice care either at facility or hospice home CMS Medicare.gov Compare Post Acute Care list provided to:: Patient Represenative (must comment) Choice offered to / list presented to : Sibling  Expected Discharge Plan and Services Expected Discharge Plan: [place] (with hospice)   Discharge Planning Services: CM Consult   Living arrangements for the past 2 months: [place]                 DME Arranged: N/A DME Agency: NA       HH Arranged: NA [place] Agency: NA        Prior Living Arrangements/Services Living arrangements for the past 2 months: [place] Lives with:: Facility Resident Patient language and need for interpreter reviewed:: Yes Do you feel safe going back to the place where you live?: Yes      Need for Family Participation in Patient Care: Yes (Comment) Care giver support system in place?: Yes (comment)   Criminal Activity/Legal Involvement Pertinent to Current Situation/Hospitalization: No - Comment as needed  Activities of Daily Living Home Assistive Devices/Equipment: None ADL Screening (condition at time of admission) Patient's cognitive ability adequate to safely complete daily  activities?: No Is the patient deaf or have difficulty hearing?: No Does the patient have difficulty seeing, even when wearing glasses/contacts?: No Does the patient have difficulty concentrating, remembering, or making decisions?: Yes Patient able to express need for assistance with ADLs?: No Does the patient have difficulty dressing or bathing?: Yes Independently performs ADLs?: No Does the patient have difficulty walking or climbing stairs?: Yes Weakness of Legs: Both Weakness of Arms/Hands: Both  Permission Sought/Granted Permission sought to share information with : Case Manager, Family Supports, Chartered certified accountant granted to share information with : Yes, Verbal Permission Granted     Permission granted to share info w AGENCY: [place]        Emotional Assessment Appearance:: Appears older than stated age Attitude/Demeanor/Rapport: Lethargic Affect (typically observed): Unable to Assess Orientation: : Oriented to Self Alcohol / Substance Use: Not Applicable Psych Involvement: No (comment)  Admission diagnosis:  Ileus ([place]) [K56.7] Urinary tract infection, acute [N39.0] HCAP (healthcare-associated pneumonia) [J18.9] Sepsis, due to unspecified organism, unspecified whether acute organ dysfunction present [facility]) [A41.9] Patient Active Problem List   Diagnosis Date Noted   Ileus ([place]) 01/13/2021   Hyperglycemia due to type 2 diabetes mellitus ([place]) 12/09/2020   Staghorn calculus 12/09/2020   GI bleed 09/16/2020   Esophagitis    Depression 12/24/2017   CVA (cerebral vascular accident) ([place]) 08/09/2017   Chronic gastritis 06/09/2017   HTN (hypertension) 04/08/2017   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[place]) 04/08/2017   PVD (peripheral vascular disease) ([place]) 04/08/2017  Barrett's esophagus without dysplasia 04/08/2017   Senile purpura ([place]) 04/08/2017   Severe sepsis ([place]) 08/15/2016   UTI (urinary tract infection) 03/06/2016   Traumatic brain injury  12/03/2014   Chronic constipation 08/23/2014   Major depression, recurrent, chronic ([place]) 08/23/2014   Diabetes mellitus, type 2 ([place]) 08/23/2014   OP (osteoporosis) 08/23/2014   HLD (hyperlipidemia) 08/23/2014   Cognitive impairment 08/23/2014   PCP:  [facility]:   [place], [place] [address] [place] [address] Phone: [phone] Fax: [phone]  [place], [place] [address] Phone: [phone] Fax: [phone]     Social Determinants of Health (SDOH) Interventions    Readmission Risk Interventions No flowsheet data found.

## 2021-01-15 NOTE — Progress Notes (Signed)
Hypoglycemic Event  CBG: 66  Treatment: D50 25 mL (12.5 gm)  Symptoms: None  Follow-up CBG: Time:0720 CBG Result:150  Possible Reasons for Event: Inadequate meal intake  Comments/MD notified:na     [name] [name]
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
## 2021-01-15 NOTE — NC FL2 (Signed)
[place] LEVEL OF CARE SCREENING TOOL     IDENTIFICATION  Patient Name: [name] [name] Birthdate: Jan 04, 1961 Sex: female Admission Date (Current Location): 01/13/2021  [facility] and [place] Number:  Engineering geologist and Address:  [facility], [address], [place],  [address]      Provider Number: [id number]  Attending Physician Name and Address:  [place] Donnamarie Poag, DO  Relative Name and Phone Number:       Current Level of Care: Hospital Recommended Level of Care: [place] Prior Approval Number:    Date Approved/Denied:   PASRR Number:    Discharge Plan: SNF    Current Diagnoses: Patient Active Problem List   Diagnosis Date Noted   Ileus ([place]) 01/13/2021   Hyperglycemia due to type 2 diabetes mellitus ([place]) 12/09/2020   Staghorn calculus 12/09/2020   GI bleed 09/16/2020   Esophagitis    Depression 12/24/2017   CVA (cerebral vascular accident) ([place]) 08/09/2017   Chronic gastritis 06/09/2017   HTN (hypertension) 04/08/2017   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[place]) 04/08/2017   PVD (peripheral vascular disease) ([place]) 04/08/2017   Barrett's esophagus without dysplasia 04/08/2017   Senile purpura ([place]) 04/08/2017   Severe sepsis ([place]) 08/15/2016   UTI (urinary tract infection) 03/06/2016   Traumatic brain injury 12/03/2014   Chronic constipation 08/23/2014   Major depression, recurrent, chronic ([place]) 08/23/2014   Diabetes mellitus, type 2 ([place]) 08/23/2014   OP (osteoporosis) 08/23/2014   HLD (hyperlipidemia) 08/23/2014   Cognitive impairment 08/23/2014    Orientation RESPIRATION BLADDER Height & Weight     Self  O2 Incontinent Weight: 75.1 kg Height:  5\' 7"  (170.2 cm) (from previous encounter)  BEHAVIORAL SYMPTOMS/MOOD NEUROLOGICAL BOWEL NUTRITION STATUS      Incontinent Feeding tube (see discharge summary)  AMBULATORY STATUS COMMUNICATION OF NEEDS Skin   Total Care Verbally Other (Comment) (eczema)                        Personal Care Assistance Level of Assistance  Total care       Total Care Assistance: Maximum assistance   Functional Limitations Info  Sight, Hearing, Speech Sight Info: Adequate Hearing Info: Adequate Speech Info: Impaired    SPECIAL CARE FACTORS FREQUENCY                       Contractures Contractures Info: Not present    Additional Factors Info  Code Status, Allergies Code Status Info: DNR Allergies Info: Dilantin, limonene, PCN, phenytoin, sulfa           Current Medications (01/15/2021):  This is the current hospital active medication list Current Facility-Administered Medications  Medication Dose Route Frequency Provider Last Rate Last Admin   0.9 %  sodium chloride infusion   Intravenous PRN Ottie Glazier, MD 10 mL/hr at 01/14/21 1200 Restarted at 01/14/21 1200   chlorhexidine (PERIDEX) 0.12 % solution 15 mL  15 mL Mouth Rinse BID Lang Snow, NP   15 mL at 01/15/21 1005   Chlorhexidine Gluconate Cloth 2 % PADS 6 each  6 each Topical Q0600 Lang Snow, NP   6 each at 01/15/21 1005   feeding supplement (OSMOLITE 1.5 CAL) liquid 1,000 mL  1,000 mL Per Tube Q24H [place] Eric J, DO       [START ON 01/16/2021] feeding supplement (PROSource TF) liquid 45 mL  45 mL Per Tube Daily [place] Donnamarie Poag, DO  free water 30 mL  30 mL Per Tube Q4H [place] Donnamarie Poag, DO   30 mL at 01/15/21 1423   insulin aspart (novoLOG) injection 0-9 Units  0-9 Units Subcutaneous Q4H [place] Donnamarie Poag, DO       MEDLINE mouth rinse  15 mL Mouth Rinse q12n4p Lang Snow, NP   15 mL at 01/15/21 1215   [START ON 01/16/2021] multivitamin liquid 15 mL  15 mL Per Tube Daily [place] Eric J, DO       piperacillin-tazobactam (ZOSYN) IVPB 3.375 g  3.375 g Intravenous Q8H Kasa, Maretta Bees, MD 12.5 mL/hr at 01/15/21 1423 3.375 g at 01/15/21 1423   polyethylene glycol (MIRALAX / GLYCOLAX) packet 17 g  17 g Per Tube Daily Olean Ree, MD   17 g  at 01/15/21 1005     Discharge Medications: Please see discharge summary for a list of discharge medications.  Relevant Imaging Results:  Relevant Lab Results:   Additional Information SS#: [id number]  Shelbie Hutching, RN

## 2021-01-15 NOTE — Progress Notes (Signed)
Assumed care of patient at 1900.  Patient to be transferred to medical floor rm 213.  Report called.  Patient pleasant and cooperative with dysarthic/slurred speech.   Plan for [place] conference call tomorrow.  [facility]) is requesting call for noon tomorrow and would ike Rob and Ron to be on call as well.  Ron's number not found in chart but can be reached at [phone].  Patient does not appear to be in any distress at this time and denies pain.
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
## 2021-01-16 DIAGNOSIS — G8250 Quadriplegia, unspecified: Secondary | ICD-10-CM

## 2021-01-16 DIAGNOSIS — J690 Pneumonitis due to inhalation of food and vomit: Secondary | ICD-10-CM

## 2021-01-16 LAB — CBC
HCT: 29.4 % — ABNORMAL LOW (ref 36.0–46.0)
Hemoglobin: 9.1 g/dL — ABNORMAL LOW (ref 12.0–15.0)
MCH: 28.4 pg (ref 26.0–34.0)
MCHC: 31 g/dL (ref 30.0–36.0)
MCV: 91.9 fL (ref 80.0–100.0)
Platelets: 229 10*3/uL (ref 150–400)
RBC: 3.2 MIL/uL — ABNORMAL LOW (ref 3.87–5.11)
RDW: 14.7 % (ref 11.5–15.5)
WBC: 7.6 10*3/uL (ref 4.0–10.5)
nRBC: 0 % (ref 0.0–0.2)

## 2021-01-16 LAB — GLUCOSE, CAPILLARY
Glucose-Capillary: 123 mg/dL — ABNORMAL HIGH (ref 70–99)
Glucose-Capillary: 135 mg/dL — ABNORMAL HIGH (ref 70–99)
Glucose-Capillary: 157 mg/dL — ABNORMAL HIGH (ref 70–99)
Glucose-Capillary: 140 mg/dL — ABNORMAL HIGH (ref 70–99)
Glucose-Capillary: 130 mg/dL — ABNORMAL HIGH (ref 70–99)

## 2021-01-16 LAB — BASIC METABOLIC PANEL
Anion gap: 7 (ref 5–15)
BUN: 9 mg/dL (ref 8–23)
CO2: 30 mmol/L (ref 22–32)
Calcium: 8.8 mg/dL — ABNORMAL LOW (ref 8.9–10.3)
Chloride: 102 mmol/L (ref 98–111)
Creatinine, Ser: 0.65 mg/dL (ref 0.44–1.00)
GFR, Estimated: >60 mL/min (ref >60)
Glucose, Bld: 123 mg/dL — ABNORMAL HIGH (ref 70–99)
Potassium: 3.7 mmol/L (ref 3.5–5.1)
Sodium: 139 mmol/L (ref 135–145)

## 2021-01-16 LAB — LEGIONELLA PNEUMOPHILA TOTAL AB: Legionella Pneumo Total Ab: 1.83 OD ratio — ABNORMAL HIGH (ref 0.00–0.90)

## 2021-01-16 MED ORDER — OSMOLITE 1.5 CAL PO LIQD
1000.0000 mL | ORAL | Status: DC
  Administered 2021-01-16 – 2021-01-18 (×2): 1000 mL

## 2021-01-16 NOTE — Progress Notes (Signed)
Pt slept in brief intervals throughout the night. No N/V noted throughout the night. Vital signs stable. Denies pain. Pt turned and repositioned frequently throughout the night.
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
## 2021-01-16 NOTE — Progress Notes (Signed)
[facility] SURGICAL PROGRESS NOTE (cpt [phone])  Hospital Day(s): 3.   Interval History: Patient seen and examined, no acute events or new complaints overnight. Patient sleeping on arrival. Arouses and answers simple questions. No complaints of abdominal pain. She has a weak, wet sounding, cough. Previously seen leukocytosis remains resolved at 7.6K. Hgb to 9.1. Renal function is normal with sCr - 0.65; UO - 700 ccs. No electrolyte derangements. No reported BM per nursing staff. Currently getting trickle feeds (20 ml/hr) via PEG. She continues on Zosyn/Flagyl.   It does appear family has met with palliative care yesterday (12/21) to discuss goals of care. Plan for family meeting today.   Review of Systems:  Unable to reliably preform secondary to mental acuity  Vital signs in last 24 hours: [min-max] current  Temp:  [98.6 F (37 C)-99 F (37.2 C)] 98.7 F (37.1 C) (12/22 0624) Pulse Rate:  [65-91] 91 (12/22 0624) Resp:  [10-19] 19 (12/22 0624) BP: (93-135)/(55-89) 128/78 (12/22 0624) SpO2:  [92 %-100 %] 95 % (12/22 0624) Weight:  [82.5 kg] 82.5 kg (12/22 0624)     Height: 5' 7"  (170.2 cm) (from previous encounter) Weight: 82.5 kg BMI (Calculated): 28.48   Intake/Output last 2 shifts:  12/21 0701 - 12/22 0700 In: 98.3 [NG/GT:17; IV Piggyback:81.3] Out: 700 [Urine:700]   Physical Exam:  Constitutional: Alert, answers simple questions HENT: normocephalic without obvious abnormality  Respiratory: no respiratory distress, on [place] Cardiovascular: regular rate and sinus rhythm  Gastrointestinal: Soft, non-tender, and non-distended, G-tube in the LUQ  ,at 4 cm, site is CDI, tube feedings running at 20 ml/hr Musculoskeletal: no edema or wounds, motor and sensation grossly intact, NT    Labs:  CBC Latest Ref Rng & Units 01/16/2021 01/15/2021 01/14/2021  WBC 4.0 - 10.5 K/uL 7.6 7.4 12.6(H)  Hemoglobin 12.0 - 15.0 g/dL 9.1(L) 8.1(L) 9.0(L)  Hematocrit 36.0 - 46.0 % 29.4(L)  26.4(L) 29.5(L)  Platelets 150 - 400 K/uL 229 199 226   CMP Latest Ref Rng & Units 01/16/2021 01/15/2021 01/14/2021  Glucose 70 - 99 mg/dL 123(H) 69(L) 97  BUN 8 - 23 mg/dL 9 12 17   Creatinine 0.44 - 1.00 mg/dL 0.65 0.67 0.54  Sodium 135 - 145 mmol/L 139 140 140  Potassium 3.5 - 5.1 mmol/L 3.7 3.7 3.9  Chloride 98 - 111 mmol/L 102 106 109  CO2 22 - 32 mmol/L 30 30 28   Calcium 8.9 - 10.3 mg/dL 8.8(L) 8.4(L) 8.3(L)  Total Protein 6.5 - 8.1 g/dL - - -  Total Bilirubin 0.3 - 1.2 mg/dL - - -  Alkaline Phos 38 - 126 U/L - - -  AST 15 - 41 U/L - - -  ALT 0 - 44 U/L - - -     Imaging studies: No new pertinent imaging studies   Assessment/Plan: (ICD-10's: K29.7) 60 y.o. female admitted to the ICU with hypoxic respiratory failure found to have gastric dilation on imaging with PEG in place, complicated by pertinent comorbidities including TBI, quadriparesis, HTN, HLD.   - Agree palliative care consultation for [place] discussion is appropriate   - Depending on [place] discussion, it is okay to start to advance enteral feedings today via PEG; check residuals occasionally   - Monitor abdominal examination; on-going bowel function  - Okay to continue bowel regimen; Miralax             - Pain control prn (minimize narcotics as feasible); antiemetics prn              -  Further management per primary service   - General surgery will sign off; we will certainly be available as needed    All of the above findings and recommendations were discussed with the patient and bedside RN.  -- Edison Simon, PA-C [facility] 01/16/2021, 7:39 AM [phone] M-F: 7am - 4pm

## 2021-01-16 NOTE — Progress Notes (Signed)
Pt admitted to room 213A, oriented to unit and call light system. Pt able to respond with one word answers and follow directions. Denies pain. TF restarted, will continue to monitor. Bed left in lowest position, call light within reach.

## 2021-01-17 LAB — GLUCOSE, CAPILLARY
Glucose-Capillary: 100 mg/dL — ABNORMAL HIGH (ref 70–99)
Glucose-Capillary: 104 mg/dL — ABNORMAL HIGH (ref 70–99)
Glucose-Capillary: 126 mg/dL — ABNORMAL HIGH (ref 70–99)
Glucose-Capillary: 143 mg/dL — ABNORMAL HIGH (ref 70–99)
Glucose-Capillary: 180 mg/dL — ABNORMAL HIGH (ref 70–99)
Glucose-Capillary: 221 mg/dL — ABNORMAL HIGH (ref 70–99)

## 2021-01-17 MED ORDER — GERHARDT'S BUTT CREAM
TOPICAL_CREAM | Freq: Three times a day (TID) | CUTANEOUS | Status: DC
  Filled 2021-01-17: qty 1

## 2021-01-17 MED ORDER — ONDANSETRON HCL 4 MG/2ML IJ SOLN: 4.0000 mg | Freq: Four times a day (QID) | INTRAMUSCULAR | Status: DC | PRN

## 2021-01-17 MED ORDER — HYOSCYAMINE SULFATE 0.125 MG SL SUBL
0.1250 mg | SUBLINGUAL_TABLET | SUBLINGUAL | Status: DC | PRN
  Administered 2021-01-17 (×2): 0.125 mg via SUBLINGUAL
  Filled 2021-01-17 (×5): qty 1

## 2021-01-17 MED ORDER — JUVEN PO PACK: 1.0000 | PACK | Freq: Two times a day (BID) | ORAL | Status: DC

## 2021-01-17 NOTE — Progress Notes (Signed)
Patients bed switch to an air bed. Patient tolerated well.  Several BMs throughout the day.
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
## 2021-01-17 NOTE — Progress Notes (Signed)
[facility]) Hospital Liaison Note  Received request from Transitions of Care Manager Meagan Nicki Reaper, LCSW for family interest in [facility]. Visited patient at bedside and spoke with sister Linwood Dibbles to confirm interest and explain services.  Approval for Hospice Home is determined by [facility] MD. Once [facility] MD has determined Hospice Home eligibility, [facility] will update hospital staff and family.  Please do not hesitate to call with any hospice related questions.    Thank you for the opportunity to participate in this patient's care.   Bobbie "Loren Racer, RN, BSN [facility] Liaison [phone]

## 2021-01-17 NOTE — Progress Notes (Signed)
[facility]) Hospital Liaison Note   Patient chart and information reviewed by [facility] physician. Hospice Home eligibility confirmed.    Unfortunately, Hospice Home is not able to offer a room today. Family and [facility] Manager aware hospital liaison will follow up tomorrow or sooner if a room becomes available.    Please do not hesitate to call with any hospice related questions.    Thank you for the opportunity to participate in this patient's care.   Bobbie "Loren Racer, RN, BSN [facility] Liaison [phone]

## 2021-01-17 NOTE — Plan of Care (Signed)

## 2021-01-17 NOTE — Progress Notes (Signed)
Patient has open wound superficial to sacral area cleansed, barrier cream applied and sacral foam placed, wound consult order placed.

## 2021-01-17 NOTE — Progress Notes (Signed)
Palliative: [name] [name], [name] [name], is lying quietly in bed.  She is resting easily and I do not wake her.  Detailed conversation with bedside nursing staff.  It seems that Ms. Deandrade had a large BM this morning that included hard impacted stools and liquid stool.  She had been complaining of stomachache, but immediately felt better after moving her bowels.  Call to sister/legal guardian, [name] [name].  We talked about [name] [name]'s acute health concerns.  I shared that nursing staff told me that [name] [name] had a bowel movement this morning that was partially impacted stool and liquid stool.  I share that it seems she is finally getting "cleaned out".  [name] [name] shares that she and her brothers talked about [name] [name]'s care going forward.  She tells me that she asked [name] [name] about care after the hospital, but [name] [name] was unable to give her a meaningful answer.  [name] [name] states that, at this point, family is requesting comfort and dignity, residential hospice with [facility].  They understand that this would mean no further IV fluids or antibiotics or tube feeding.  We spoke in detail yesterday that people go to residential hospice to "let nature take its course".  [name] [name] asked about comfort medications if/when [name] [name] aspirates.  I share that hospice will be giving symptom management as needed, the goal is to reduce her suffering.  [name] [name] is requesting to meet with in-house [facility] rep.  Conference with attending, bedside nursing staff, transition of care team related to patient condition, needs, goals of care, disposition.  Plan: At this point continue to treat the treatable but no CPR or intubation.   Prognosis: 2 weeks or less is anticipated with no further IV hydration or nutrition.  Anticipate aspiration event within the first week.    62 minutes [name] Axe, NP Palliative medicine team Team phone [phone] Greater than 50% of this time was spent counseling and coordinating care related to the above  assessment and plan.

## 2021-01-17 NOTE — TOC Progression Note (Signed)
Transition of Care [facility]) - Progression Note    Patient Details  Name: [name] [name] MRN: [id number] Date of Birth: [birth year]  Transition of Care [facility]) CM/SW [place], LCSW Phone Number: 01/17/2021, 2:31 PM  Clinical Narrative:   Notified by Palliative NP of plan for [facility]. Sonia Baller with [facility] aware of referral and will meet with family at bedside today.    Expected Discharge Plan: [place] (with hospice) Barriers to Discharge: Continued Medical Work up  Expected Discharge Plan and Services Expected Discharge Plan: [place] (with hospice)   Discharge Planning Services: CM Consult   Living arrangements for the past 2 months: [place]                 DME Arranged: N/A DME Agency: NA       HH Arranged: NA [place] Agency: NA         Social Determinants of Health (SDOH) Interventions    Readmission Risk Interventions No flowsheet data found.

## 2021-01-17 NOTE — Consult Note (Signed)
WOC Nurse Consult Note: Reason for Consult:buttock intertriginous MASD (ITD) now called irritant contact dermatitis with partial thickness skin breakdown. Patient with urinary and fecal incontinence.  ICD-10 CM Codes for Irritant Dermatitis  L24A2 - Due to fecal, urinary or dual incontinence L30.4  - Erythema intertrigo. Also used for abrasion of the hand, chafing of the skin, dermatitis due to sweating and friction, friction dermatitis, friction eczema, and genital/thigh intertrigo.   Wound type:moisture Pressure Injury POA: N/A Measurement:4cm x 0.1cm x 0.2cm Wound WPT:YYPE, moist Drainage (amount, consistency, odor) scant serous Periwound: with erythema, edema to perineal area Dressing procedure/placement/frequency: Patient is being turned and repositioned, skin care is being provided.  Will add mattress replacement with low air loss feature for mitigation of microclimate.  [facility] nursing team will not follow, but will remain available to this patient, the nursing and medical teams.  Please re-consult if needed. Thanks, Maudie Flakes, MSN, RN, [place], Arther Abbott  Pager# [phone]

## 2021-01-18 LAB — GLUCOSE, CAPILLARY
Glucose-Capillary: 180 mg/dL — ABNORMAL HIGH (ref 70–99)
Glucose-Capillary: 189 mg/dL — ABNORMAL HIGH (ref 70–99)

## 2021-01-18 LAB — CULTURE, BLOOD (ROUTINE X 2)
Culture: NO GROWTH
Culture: NO GROWTH
Culture: NO GROWTH
Special Requests: ADEQUATE

## 2021-01-18 LAB — CBC
HCT: 28.8 % — ABNORMAL LOW (ref 36.0–46.0)
Hemoglobin: 8.7 g/dL — ABNORMAL LOW (ref 12.0–15.0)
MCH: 28 pg (ref 26.0–34.0)
MCHC: 30.2 g/dL (ref 30.0–36.0)
MCV: 92.6 fL (ref 80.0–100.0)
Platelets: 237 10*3/uL (ref 150–400)
RBC: 3.11 MIL/uL — ABNORMAL LOW (ref 3.87–5.11)
RDW: 14.9 % (ref 11.5–15.5)
WBC: 5.5 10*3/uL (ref 4.0–10.5)
nRBC: 0 % (ref 0.0–0.2)

## 2021-01-18 LAB — BASIC METABOLIC PANEL
Anion gap: 3 — ABNORMAL LOW (ref 5–15)
BUN: 10 mg/dL (ref 8–23)
CO2: 33 mmol/L — ABNORMAL HIGH (ref 22–32)
Calcium: 9.1 mg/dL (ref 8.9–10.3)
Chloride: 107 mmol/L (ref 98–111)
Creatinine, Ser: 0.58 mg/dL (ref 0.44–1.00)
GFR, Estimated: >60 mL/min (ref >60)
Glucose, Bld: 196 mg/dL — ABNORMAL HIGH (ref 70–99)
Potassium: 3.7 mmol/L (ref 3.5–5.1)
Sodium: 143 mmol/L (ref 135–145)

## 2021-01-18 MED ORDER — MORPHINE SULFATE (CONCENTRATE) 10 MG/0.5ML PO SOLN: 10.0000 mg | ORAL | Status: DC | PRN

## 2021-01-18 MED ORDER — MORPHINE SULFATE (CONCENTRATE) 10 MG/0.5ML PO SOLN: 10.0000 mg | ORAL | 0 refills | Status: AC | PRN

## 2021-01-18 MED ORDER — LORAZEPAM 0.5 MG PO TABS: 0.5000 mg | ORAL_TABLET | Freq: Four times a day (QID) | ORAL | 0 refills | Status: AC | PRN

## 2021-01-18 MED ORDER — LORAZEPAM 0.5 MG PO TABS: 0.5000 mg | ORAL_TABLET | Freq: Four times a day (QID) | ORAL | Status: DC | PRN

## 2021-01-18 NOTE — TOC Transition Note (Addendum)
Transition of Care [facility]) - CM/SW Discharge Note   Patient Details  Name: [name] [name] MRN: [id number] Date of Birth: [birth year]  Transition of Care [facility]) CM/SW Contact:  Magnus Ivan, LCSW Phone Number: 01/18/2021, 9:40 AM   Clinical Narrative:    Patient to DC to [facility] in [place] today. Confirmed with [facility] Representatives [place] and Santiago Glad who have spoken with family to notify them. Hospice Home will be ready for patient at 11 am.  EMS Paperwork completed and asked MD to sign DNR. DC Summary has been faxed to Hospice. Hospice RN to call unit for report from Bedside RN. [facility] arranged for 11 am or after, pending truck availability.   Final next level of care: [place] Barriers to Discharge: Barriers Resolved   Patient Goals and CMS Choice Patient states their goals for this hospitalization and ongoing recovery are:: Family thinking about hospice care either at facility or hospice home CMS Medicare.gov Compare Post Acute Care list provided to:: Patient Represenative (must comment) Choice offered to / list presented to : Sibling  Discharge Placement                Patient to be transferred to facility by: [facility] Name of family member notified: by hospice Patient and family notified of of transfer: 01/18/21  Discharge Plan and Services   Discharge Planning Services: CM Consult            DME Arranged: N/A DME Agency: NA       HH Arranged: NA HH Agency: NA        Social Determinants of Health ([place]) Interventions     Readmission Risk Interventions No flowsheet data found.

## 2021-01-18 NOTE — Progress Notes (Signed)
Report given to [facility], hospice RN asked to leave IV upon d'c.
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
## 2021-01-18 NOTE — TOC Progression Note (Signed)
Transition of Care [facility]) - Progression Note    Patient Details  Name: [name] [name] MRN: [id number] Date of Birth: [birth year]  Transition of Care [facility]) CM/SW [place], LCSW Phone Number: 01/18/2021, 9:16 AM  Clinical Narrative:   Call to Hospice Home. Spoke with Santiago Glad who stated they will have a bed for patient today, they just need to talk with family and get consents signed. Updated MD and RN.    Expected Discharge Plan: [place] (with hospice) Barriers to Discharge: Continued Medical Work up  Expected Discharge Plan and Services Expected Discharge Plan: [place] (with hospice)   Discharge Planning Services: CM Consult   Living arrangements for the past 2 months: [place]                 DME Arranged: N/A DME Agency: NA       HH Arranged: NA [place] Agency: NA         Social Determinants of Health (SDOH) Interventions    Readmission Risk Interventions No flowsheet data found.

## 2021-01-20 NOTE — Progress Notes (Signed)
Patient went home on Hospice care on 12/24. No longer in need of surgery.

## 2021-01-24 ENCOUNTER — Ambulatory Visit: Admission: RE | Admit: 2021-01-24 | Payer: Medicare Other | Source: Home / Self Care

## 2021-01-24 ENCOUNTER — Encounter: Admission: RE | Payer: Self-pay | Source: Home / Self Care

## 2021-01-24 SURGERY — COLONOSCOPY WITH PROPOFOL
Anesthesia: General

## 2021-02-26 DEATH — deceased
